# Patient Record
Sex: Male | Born: 1995 | Race: Black or African American | Hispanic: No | Marital: Single | Smoking: Never smoker
Health system: Southern US, Community
[De-identification: ages and names within clinical notes are randomized; demographics above are authoritative.]

## PROBLEM LIST (undated history)

## (undated) DIAGNOSIS — C8595 Non-Hodgkin lymphoma, unspecified, lymph nodes of inguinal region and lower limb: Secondary | ICD-10-CM

## (undated) DIAGNOSIS — C8590 Non-Hodgkin lymphoma, unspecified, unspecified site: Secondary | ICD-10-CM

## (undated) HISTORY — PX: ANKLE SURGERY: SHX546

## (undated) HISTORY — PX: ORIF FINGER / THUMB FRACTURE: SUR932

## (undated) HISTORY — PX: FRACTURE SURGERY: SHX138

---

## 2014-10-31 DIAGNOSIS — S52202A Unspecified fracture of shaft of left ulna, initial encounter for closed fracture: Secondary | ICD-10-CM | POA: Insufficient documentation

## 2014-10-31 DIAGNOSIS — S63045A Dislocation of carpometacarpal joint of left thumb, initial encounter: Secondary | ICD-10-CM | POA: Insufficient documentation

## 2014-10-31 DIAGNOSIS — S52302A Unspecified fracture of shaft of left radius, initial encounter for closed fracture: Secondary | ICD-10-CM

## 2014-11-16 DIAGNOSIS — M25572 Pain in left ankle and joints of left foot: Secondary | ICD-10-CM | POA: Insufficient documentation

## 2014-12-30 DIAGNOSIS — S63642A Sprain of metacarpophalangeal joint of left thumb, initial encounter: Secondary | ICD-10-CM | POA: Insufficient documentation

## 2014-12-30 DIAGNOSIS — S5322XA Traumatic rupture of left radial collateral ligament, initial encounter: Secondary | ICD-10-CM | POA: Insufficient documentation

## 2018-05-07 ENCOUNTER — Other Ambulatory Visit: Payer: Self-pay

## 2018-05-07 ENCOUNTER — Inpatient Hospital Stay (HOSPITAL_COMMUNITY)
Admission: EM | Admit: 2018-05-07 | Discharge: 2018-05-09 | DRG: 824 | Disposition: A | Discharge status: Home / Self Care | Payer: BLUE CROSS/BLUE SHIELD | Attending: Internal Medicine | Admitting: Internal Medicine

## 2018-05-07 ENCOUNTER — Emergency Department (HOSPITAL_COMMUNITY): Payer: BLUE CROSS/BLUE SHIELD

## 2018-05-07 ENCOUNTER — Encounter (HOSPITAL_COMMUNITY): Payer: Self-pay | Admitting: Emergency Medicine

## 2018-05-07 DIAGNOSIS — J9859 Other diseases of mediastinum, not elsewhere classified: Secondary | ICD-10-CM | POA: Diagnosis not present

## 2018-05-07 DIAGNOSIS — C8112 Nodular sclerosis classical Hodgkin lymphoma, intrathoracic lymph nodes: Secondary | ICD-10-CM | POA: Diagnosis not present

## 2018-05-07 DIAGNOSIS — I3139 Other pericardial effusion (noninflammatory): Secondary | ICD-10-CM

## 2018-05-07 DIAGNOSIS — R079 Chest pain, unspecified: Secondary | ICD-10-CM | POA: Diagnosis not present

## 2018-05-07 DIAGNOSIS — R222 Localized swelling, mass and lump, trunk: Secondary | ICD-10-CM

## 2018-05-07 DIAGNOSIS — R000 Tachycardia, unspecified: Secondary | ICD-10-CM | POA: Diagnosis present

## 2018-05-07 DIAGNOSIS — I313 Pericardial effusion (noninflammatory): Secondary | ICD-10-CM | POA: Diagnosis present

## 2018-05-07 LAB — CBC
HCT: 42.3 % (ref 39.0–52.0)
HEMOGLOBIN: 14.2 g/dL (ref 13.0–17.0)
MCH: 26.9 pg (ref 26.0–34.0)
MCHC: 33.6 g/dL (ref 30.0–36.0)
MCV: 80.1 fL (ref 80.0–100.0)
PLATELETS: 366 10*3/uL (ref 150–400)
RBC: 5.28 MIL/uL (ref 4.22–5.81)
RDW: 15 % (ref 11.5–15.5)
WBC: 15.5 10*3/uL — ABNORMAL HIGH (ref 4.0–10.5)
nRBC: 0 % (ref 0.0–0.2)

## 2018-05-07 LAB — D-DIMER, QUANTITATIVE: D-Dimer, Quant: 0.35 ug/mL-FEU (ref 0.00–0.50)

## 2018-05-07 LAB — BASIC METABOLIC PANEL
Anion gap: 9 (ref 5–15)
BUN: 11 mg/dL (ref 6–20)
CALCIUM: 9.5 mg/dL (ref 8.9–10.3)
CO2: 25 mmol/L (ref 22–32)
Chloride: 103 mmol/L (ref 98–111)
Creatinine, Ser: 1.07 mg/dL (ref 0.61–1.24)
GFR calc Af Amer: >60 mL/min (ref >60)
GLUCOSE: 82 mg/dL (ref 70–99)
POTASSIUM: 3.9 mmol/L (ref 3.5–5.1)
Sodium: 137 mmol/L (ref 135–145)

## 2018-05-07 LAB — I-STAT TROPONIN, ED: TROPONIN I, POC: 0 ng/mL (ref 0.00–0.08)

## 2018-05-07 LAB — BRAIN NATRIURETIC PEPTIDE: B Natriuretic Peptide: 14.2 pg/mL (ref 0.0–100.0)

## 2018-05-07 LAB — SAVE SMEAR (SSMR)

## 2018-05-07 MED ORDER — ACETAMINOPHEN 325 MG PO TABS
650.0000 mg | ORAL_TABLET | Freq: Four times a day (QID) | ORAL | Status: DC | PRN
  Administered 2018-05-08: 650 mg via ORAL
  Filled 2018-05-07: qty 2

## 2018-05-07 MED ORDER — SODIUM CHLORIDE 0.9 % IV BOLUS
1000.0000 mL | Freq: Once | INTRAVENOUS | Status: AC
  Administered 2018-05-07: 1000 mL via INTRAVENOUS

## 2018-05-07 MED ORDER — ONDANSETRON HCL 4 MG/2ML IJ SOLN: 4.0000 mg | Freq: Four times a day (QID) | INTRAMUSCULAR | Status: DC | PRN

## 2018-05-07 MED ORDER — SODIUM CHLORIDE 0.9 % IV SOLN
INTRAVENOUS | Status: AC
  Administered 2018-05-08 (×2): via INTRAVENOUS

## 2018-05-07 MED ORDER — ONDANSETRON HCL 4 MG PO TABS: 4.0000 mg | ORAL_TABLET | Freq: Four times a day (QID) | ORAL | Status: DC | PRN

## 2018-05-07 MED ORDER — IOPAMIDOL (ISOVUE-370) INJECTION 76%
INTRAVENOUS | Status: AC
  Filled 2018-05-07: qty 100

## 2018-05-07 MED ORDER — IOPAMIDOL (ISOVUE-370) INJECTION 76%
100.0000 mL | Freq: Once | INTRAVENOUS | Status: AC | PRN
  Administered 2018-05-07: 100 mL via INTRAVENOUS

## 2018-05-07 MED ORDER — ACETAMINOPHEN 650 MG RE SUPP: 650.0000 mg | Freq: Four times a day (QID) | RECTAL | Status: DC | PRN

## 2018-05-07 NOTE — ED Triage Notes (Addendum)
Pt c/o mid chest pain x's 2 days.  St's he went to a urgent care and was sent here for further evaluation .  Painful to take a deep breath.  Pt st's he went to a Urgent Care on [address]

## 2018-05-07 NOTE — ED Provider Notes (Signed)
Patient placed in Quick Look pathway, seen and evaluated   Chief Complaint: chest pain  HPI:   Pt states he woke up yesterday with chest pain in center of the chest. Tried ibuprofen, which improved his pain from 8/10 to 3/10. States today went to [facility] and had xray done and was told he had enlarged heart and sent here. Denies SOB. Denies recent travel or surgeries.  Denies any swelling in extremities.  Patient states pain is worse with movement and taking deep breaths.  He denies any shortness of breath.  He denies any injuries. ROS: Positive for chest pain.  Physical Exam:   Gen: No distress  Neuro: Awake and Alert  Skin: Warm    Focused Exam: Tenderness to palpation over the sternum.  Lungs are clear, regular heart rate and rhythm.  Vitals:   05/07/18 1707 05/07/18 1710  BP:  124/82  Pulse:  (!) 110  Resp:  18  Temp:  98.3 F (36.8 C)  TempSrc:  Oral  SpO2:  100%  Weight: 82.1 kg   Height: 5' 8.5" (1.74 m)    Patient in emergency department with centralized chest pain, that started 2 days ago.  Pain did improve with ibuprofen but got worse again once and wore out.  Went to urgent care, [facility], and was told that his heart might be enlarged.  He was sent here from there for further evaluation.  Patient is tachycardic, has pleuritic chest pain, concerning for possible PE.  I will get a d-dimer, he is relatively low risk.  We will repeat a chest x-ray here and basic labs.  EKG ordered as well.     Initiation of care has begun. The patient has been counseled on the process, plan, and necessity for staying for the completion/evaluation, and the remainder of the medical screening examination    Jeannett Senior, PA-C 05/07/18 1723    Little, Wenda Overland, MD 05/08/18 1426

## 2018-05-07 NOTE — H&P (Signed)
History and Physical    David Lam DTO:[id number] DOB: April 04, 1996 DOA: 05/07/2018  PCP: System, Pcp Not In  Patient coming from: Home.  Chief Complaint: Chest pain.  HPI: David Lam is a 22 y.o. male with no significant past medical history presents to the ER because of 48 hours of chest pain.  Chest pain is mostly pleuritic in nature and positional.  Denies any exertional symptoms denies any fever chills shortness of breath productive cough or any hemoptysis.  Denies any diaphoresis or night sweats.  Has been having some weight loss which patient states has been delivered.  Denies any recent travel or sick contact.  ED Course: In the ER patient is found to be in sinus tachycardia.  Labs show leukocytosis.  Chest x-ray was showing mediastinal mass for which CT angiogram of the chest was done which shows superior mediastinal mass.  On-call oncologist Dr. Alvy Bimler was consulted by the ER physician who recommended admission and further work-up including biopsy possibly by a cardiothoracic surgeon in the morning.  On my exam patient is mildly tachycardic otherwise chest pain-free.  Troponins were negative.  Review of Systems: As per HPI, rest all negative.   History reviewed. No pertinent past medical history.  Past Surgical History:  Procedure Laterality Date  . FRACTURE SURGERY       reports that he has never smoked. He has never used smokeless tobacco. He reports that he does not drink alcohol or use drugs.  No Known Allergies  History reviewed. No pertinent family history.  Prior to Admission medications   Medication Sig Start Date End Date Taking? Authorizing Provider  diphenhydrAMINE (BENADRYL) 25 mg capsule Take 25 mg by mouth every 6 (six) hours as needed for allergies.   Yes [provider]    Physical Exam: Vitals:   05/07/18 2030 05/07/18 2045 05/07/18 2145 05/07/18 2200  BP:   112/74 114/64  Pulse: (!) 111 (!) 103 (!) 107 100  Resp: 15 (!) 27 20 (!) 22   Temp:      TempSrc:      SpO2: 100% 100% 99% 100%  Weight:      Height:          Constitutional: Moderately built and nourished. Vitals:   05/07/18 2030 05/07/18 2045 05/07/18 2145 05/07/18 2200  BP:   112/74 114/64  Pulse: (!) 111 (!) 103 (!) 107 100  Resp: 15 (!) 27 20 (!) 22  Temp:      TempSrc:      SpO2: 100% 100% 99% 100%  Weight:      Height:       Eyes: Anicteric no pallor. ENMT: No discharge from the ears eyes nose or mouth. Neck: No mass felt.  No neck rigidity but no JVD appreciated. Respiratory: No rhonchi or crepitations. Cardiovascular: S1-S2 heard tachycardic. Abdomen: Soft nontender bowel sounds present. Musculoskeletal: No edema.  No joint effusion. Skin: No rash. Neurologic: Alert awake oriented to time place and person.  Moves all extremities. Psychiatric: Appears normal per normal affect.   Labs on Admission: I have personally reviewed following labs and imaging studies  CBC: Recent Labs  Lab 05/07/18 1723  WBC 15.5*  HGB 14.2  HCT 42.3  MCV 80.1  PLT 983   Basic Metabolic Panel: Recent Labs  Lab 05/07/18 1723  NA 137  K 3.9  CL 103  CO2 25  GLUCOSE 82  BUN 11  CREATININE 1.07  CALCIUM 9.5   GFR: Estimated Creatinine Clearance: 105.7 mL/min (  by C-G formula based on SCr of 1.07 mg/dL). Liver Function Tests: No results for input(s): AST, ALT, ALKPHOS, BILITOT, PROT, ALBUMIN in the last 168 hours. No results for input(s): LIPASE, AMYLASE in the last 168 hours. No results for input(s): AMMONIA in the last 168 hours. Coagulation Profile: No results for input(s): INR, PROTIME in the last 168 hours. Cardiac Enzymes: No results for input(s): CKTOTAL, CKMB, CKMBINDEX, TROPONINI in the last 168 hours. BNP (last 3 results) No results for input(s): PROBNP in the last 8760 hours. HbA1C: No results for input(s): HGBA1C in the last 72 hours. CBG: No results for input(s): GLUCAP in the last 168 hours. Lipid Profile: No results for  input(s): CHOL, HDL, LDLCALC, TRIG, CHOLHDL, LDLDIRECT in the last 72 hours. Thyroid Function Tests: No results for input(s): TSH, T4TOTAL, FREET4, T3FREE, THYROIDAB in the last 72 hours. Anemia Panel: No results for input(s): VITAMINB12, FOLATE, FERRITIN, TIBC, IRON, RETICCTPCT in the last 72 hours. Urine analysis: No results found for: COLORURINE, APPEARANCEUR, LABSPEC, PHURINE, GLUCOSEU, HGBUR, BILIRUBINUR, KETONESUR, PROTEINUR, UROBILINOGEN, NITRITE, LEUKOCYTESUR Sepsis Labs: @LABRCNTIP (procalcitonin:4,lacticidven:4) )No results found for this or any previous visit (from the past 240 hour(s)).   Radiological Exams on Admission: Dg Chest 2 View  Result Date: 05/07/2018 CLINICAL DATA:  Mid to LEFT side chest pain for 2 days, painful to take a deep breath EXAM: CHEST - 2 VIEW COMPARISON:  None FINDINGS: Normal heart size. Pulmonary vascularity normal. Abnormal soft tissue density at the superior mediastinum, AP window, RIGHT paratracheal, and RIGHT hilum highly suggestive at adenopathy. Lungs clear. No infiltrate, pleural effusion or pneumothorax. Spina bifida occulta of C7. IMPRESSION: Suspected mediastinal and RIGHT hilar adenopathy; this can be seen with multiple etiologies including infection, other inflammatory processes, lymphoproliferative disorders, and malignancy. CT chest with contrast recommended for further evaluation. Electronically Signed   By: Lavonia Dana M.D.   On: 05/07/2018 18:00   Ct Angio Chest Pe W/cm &/or Wo Cm  Result Date: 05/07/2018 CLINICAL DATA:  Abnormal chest radiograph showing an enlarged mediastinum. Chest pain for 2 days. Afebrile. EXAM: CT ANGIOGRAPHY CHEST WITH CONTRAST TECHNIQUE: Multidetector CT imaging of the chest was performed using the standard protocol during bolus administration of intravenous contrast. Multiplanar CT image reconstructions and MIPs were obtained to evaluate the vascular anatomy. CONTRAST:  171mL ISOVUE-370 IOPAMIDOL (ISOVUE-370)  INJECTION 76% COMPARISON:  Chest radiograph from same day. FINDINGS: Cardiovascular: Included heart size is top normal with small pericardial effusion measuring up 1 cm in thickness. No thoracic aortic aneurysm or dissection. No acute pulmonary embolus. Patent great vessels with conventional branch pattern. Mediastinum/Nodes: Confluent appearing noncalcified soft tissue masses are noted within the superior mediastinum bilaterally. On the right, the largest conglomeration measures approximately a 9.9 x 7.1 x 5.6 cm and on the left, 5.9 x 4.1 x 4 cm. Smaller paratracheal soft tissue masses are noted. Nonpathologic size hilar lymph nodes are identified the largest on the right approximately 1.1 cm short axis. Lungs/Pleura: Scattered ground-glass opacities likely representing areas of alveolitis/pneumonitis or hypoventilatory change are identified. No dominant mass effusion or pneumothorax. Trace pleural effusions bilaterally. Upper Abdomen: No splenomegaly. The unenhanced liver, included adrenal glands and kidneys are unremarkable. Musculoskeletal: No acute nor aggressive osseous abnormality. No axillary lymphadenopathy. Review of the MIP images confirms the above findings. IMPRESSION: Bulky, bilateral anterior superior mediastinal confluent soft tissue masses which are not calcified in appearance and which do not appear to cause occlusion or significant luminal narrowing of traversing vasculature. Leading consideration is lymphoma, possibly Hodgkin's. Given  lack of differentiating soft tissue densities, a teratoma is believed less likely. Nonseminomatous germ cell tumor given presence of small effusions might also be within differential though no pulmonary lesions are visualized. Metastatic disease, infection or inflammatory process are believed less likely. Electronically Signed   By: Ashley Royalty M.D.   On: 05/07/2018 21:45    EKG: Independently reviewed.  Sinus tachycardia with nonspecific ST-T  changes.  Assessment/Plan Active Problems:   Chest pain   Mediastinal mass    1. 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  On-call oncologist Dr. Rozetta Nunnery advised to have biopsy probably by cardiothoracic surgeon in the morning.  Will check LDH sed rate HIV status. 2. Chest pain with pericardial effusion appears to be pleuritic.  Check sed rate and 2D echo.  Recheck cardiac markers. 3. Leukocytosis -patient is afebrile. 4. Sinus tachycardia -check TSH and follow 2D echo.   DVT prophylaxis: SCDs in anticipation of procedure. Code Status: Full code. Family Communication: Discussed with patient. Disposition Plan: Home. Consults called: ER physician discussed with on-call oncologist. Admission status: Observation.   Rise Patience MD [facility] Pager [phone].  If 7PM-7AM, please contact night-coverage [email or web address] Password [email or web address]  05/07/2018, 10:50 PM

## 2018-05-07 NOTE — ED Provider Notes (Signed)
[place] EMERGENCY DEPARTMENT Provider Note   CSN: [id number] Arrival date & time: 05/07/18  1658     History   Chief Complaint Chief Complaint  Patient presents with  . Chest Pain    HPI David Lam is a 22 y.o. male with no significant past medical history who presents today for evaluation of an abnormal chest x-ray from urgent care.  He reports that Sunday night when he went to bed he had pain in the center of his chest.  Yesterday he tried some ibuprofen which took his pain from a 8 out of 10 down to a 3 out of 10.  He went to urgent care and was sent here.  He reports his pain is worse with movement and with taking deep breaths.  He denies any recent trauma, no shortness of breath.  He reports no unintentional weight loss, fevers, night sweats or chills.  He recently got his flu shot.  He denies significant coughing.  [place] exposure [place].  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    He states that he has not seen a doctor in many years.  He eats healthy  HPI  History reviewed. No pertinent past medical history.  Patient Active Problem List   Diagnosis Date Noted  . Chest pain 05/07/2018    Past Surgical History:  Procedure Laterality Date  . FRACTURE SURGERY          Home Medications    Prior to Admission medications   Medication Sig Start Date End Date Taking? Authorizing Provider  diphenhydrAMINE (BENADRYL) 25 mg capsule Take 25 mg by mouth every 6 (six) hours as needed for allergies.   Yes [provider]    Family History No family history on file.  Social History Social History   Tobacco Use  . Smoking status: Never Smoker  . Smokeless tobacco: Never Used  Substance Use Topics  . Alcohol use: Never    Frequency: Never  . Drug use: Never     Allergies   Patient has no known allergies.   Review of Systems Review of Systems    Constitutional: Negative for chills, fatigue, fever and unexpected weight change.  HENT: Negative for congestion and facial swelling.   Respiratory: Negative for chest tightness and shortness of breath.   Cardiovascular: Positive for chest pain. Negative for palpitations and leg swelling.  Gastrointestinal: Negative for abdominal pain, nausea and vomiting.  Musculoskeletal: Negative for back pain and neck pain.  Skin: Negative for rash.  Neurological: Negative for weakness and headaches.  All other systems reviewed and are negative.    Physical Exam Updated Vital Signs BP 114/64   Pulse 100   Temp 98.3 F (36.8 C) (Oral)   Resp (!) 22   Ht 5' 8.5" (1.74 m)   Wt 82.1 kg   SpO2 100%   BMI 27.12 kg/m   Physical Exam  Constitutional: He appears well-developed and well-nourished.  Non-toxic appearance. No distress.  HENT:  Head: Normocephalic and atraumatic.  Eyes: Conjunctivae are normal.  Neck: Normal range of motion. Neck supple.  Cardiovascular: Normal rate, regular rhythm, intact distal pulses and normal pulses.  No murmur heard. Pulses:      Radial pulses are 2+ on the right side, and 2+ on the left side.  Pulmonary/Chest: Effort normal and breath sounds normal. No respiratory distress. He has no decreased breath sounds. He  has no wheezes. He has no rhonchi. He has no rales.  Abdominal: Soft. There is no tenderness.  Musculoskeletal: He exhibits no edema.       Right lower leg: Normal. He exhibits no tenderness and no edema.       Left lower leg: Normal. He exhibits no tenderness and no edema.  Neurological: He is alert.  Skin: Skin is warm and dry.  Psychiatric: He has a normal mood and affect. His behavior is normal.  Nursing note and vitals reviewed.    ED Treatments / Results  Labs (all labs ordered are listed, but only abnormal results are displayed) Labs Reviewed  CBC - Abnormal; Notable for the following components:      Result Value   WBC 15.5 (*)     All other components within normal limits  BASIC METABOLIC PANEL  D-DIMER, QUANTITATIVE (NOT AT [facility])  BRAIN NATRIURETIC PEPTIDE  DIFFERENTIAL  SAVE SMEAR (SSMR)  I-STAT TROPONIN, ED    EKG None  Radiology Dg Chest 2 View  Result Date: 05/07/2018 CLINICAL DATA:  Mid to LEFT side chest pain for 2 days, painful to take a deep breath EXAM: CHEST - 2 VIEW COMPARISON:  None FINDINGS: Normal heart size. Pulmonary vascularity normal. Abnormal soft tissue density at the superior mediastinum, AP window, RIGHT paratracheal, and RIGHT hilum highly suggestive at adenopathy. Lungs clear. No infiltrate, pleural effusion or pneumothorax. Spina bifida occulta of C7. IMPRESSION: Suspected mediastinal and RIGHT hilar adenopathy; this can be seen with multiple etiologies including infection, other inflammatory processes, lymphoproliferative disorders, and malignancy. CT chest with contrast recommended for further evaluation. Electronically Signed   By: Lavonia Dana M.D.   On: 05/07/2018 18:00   Ct Angio Chest Pe W/cm &/or Wo Cm  Result Date: 05/07/2018 CLINICAL DATA:  Abnormal chest radiograph showing an enlarged mediastinum. Chest pain for 2 days. Afebrile. EXAM: CT ANGIOGRAPHY CHEST WITH CONTRAST TECHNIQUE: Multidetector CT imaging of the chest was performed using the standard protocol during bolus administration of intravenous contrast. Multiplanar CT image reconstructions and MIPs were obtained to evaluate the vascular anatomy. CONTRAST:  155mL ISOVUE-370 IOPAMIDOL (ISOVUE-370) INJECTION 76% COMPARISON:  Chest radiograph from same day. FINDINGS: Cardiovascular: Included heart size is top normal with small pericardial effusion measuring up 1 cm in thickness. No thoracic aortic aneurysm or dissection. No acute pulmonary embolus. Patent great vessels with conventional branch pattern. Mediastinum/Nodes: Confluent appearing noncalcified soft tissue masses are noted within the superior mediastinum bilaterally. On the  right, the largest conglomeration measures approximately a 9.9 x 7.1 x 5.6 cm and on the left, 5.9 x 4.1 x 4 cm. Smaller paratracheal soft tissue masses are noted. Nonpathologic size hilar lymph nodes are identified the largest on the right approximately 1.1 cm short axis. Lungs/Pleura: Scattered ground-glass opacities likely representing areas of alveolitis/pneumonitis or hypoventilatory change are identified. No dominant mass effusion or pneumothorax. Trace pleural effusions bilaterally. Upper Abdomen: No splenomegaly. The unenhanced liver, included adrenal glands and kidneys are unremarkable. Musculoskeletal: No acute nor aggressive osseous abnormality. No axillary lymphadenopathy. Review of the MIP images confirms the above findings. IMPRESSION: Bulky, bilateral anterior superior mediastinal confluent soft tissue masses which are not calcified in appearance and which do not appear to cause occlusion or significant luminal narrowing of traversing vasculature. Leading consideration is lymphoma, possibly Hodgkin's. Given lack of differentiating soft tissue densities, a teratoma is believed less likely. Nonseminomatous germ cell tumor given presence of small effusions might also be within differential though no pulmonary lesions are visualized.  Metastatic disease, infection or inflammatory process are believed less likely. Electronically Signed   By: Ashley Royalty M.D.   On: 05/07/2018 21:45    Procedures Procedures (including critical care time)  Medications Ordered in ED Medications  iopamidol (ISOVUE-370) 76 % injection 100 mL (100 mLs Intravenous Contrast Given 05/07/18 2110)  sodium chloride 0.9 % bolus 1,000 mL (1,000 mLs Intravenous New Bag/Given 05/07/18 2137)     Initial Impression / Assessment and Plan / ED Course  I have reviewed the triage vital signs and the nursing notes.  Pertinent labs & imaging results that were available during my care of the patient were reviewed by me and considered  in my medical decision making (see chart for details).  Clinical Course as of May 07 2244  Tue May 07, 2018  2214 Spoke with Dr. Alvy Bimler from oncology, recommended medical admission with CT surgery biopsy consult.    [EH]  2227 Spoke with patient, and then at his request his sister by phone who lives in [place].  Explained the CT scan results to the patient, with concern for cancer.  Explained that it is not definite until he gets biopsy results.  He is agreeable to be admitted.    [EH]    Clinical Course User Index [EH] Lorin Glass, PA-C   David Lam presents today for duration of 2 days of chest pain.  He initially went to urgent care and was referred here for an abnormal x-ray.  Labs were obtained and reviewed, white count is mildly elevated at 15.5, however otherwise unremarkable.  He is tachycardic here.  His d-dimer is not elevated at 0.35.  Troponin is not elevated.  BNP 14.2.  Chest x-ray was obtained with recommendation of CT.  CT angios PE study was performed showing multi bilateral anterior superior mediastinal confluent soft tissue masses which are noncalcified, concerning for lymphoma or Hodgkin's lymphoma.  CT scan also showed a small pericardial effusion.  Given concern for chest mass with possible new cancer I spoke with oncology Dr. Alvy Bimler who requested the patient be admitted by medicine at [facility], for access to CT surgery for biopsy.  These results were discussed with patient, and at his request I spoke with his sister over the phone.    Hospitalist was consulted and agreed to admit patient.  Final Clinical Impressions(s) / ED Diagnoses   Final diagnoses:  Chest mass  Pericardial effusion  Chest pain, unspecified type    ED Discharge Orders    None       Lorin Glass, PA-C 05/07/18 2249    Lennice Sites, DO 05/08/18 1623

## 2018-05-08 ENCOUNTER — Other Ambulatory Visit: Payer: Self-pay

## 2018-05-08 ENCOUNTER — Inpatient Hospital Stay (HOSPITAL_COMMUNITY): Payer: BLUE CROSS/BLUE SHIELD

## 2018-05-08 ENCOUNTER — Observation Stay (HOSPITAL_COMMUNITY): Payer: BLUE CROSS/BLUE SHIELD

## 2018-05-08 DIAGNOSIS — R590 Localized enlarged lymph nodes: Secondary | ICD-10-CM

## 2018-05-08 DIAGNOSIS — J9859 Other diseases of mediastinum, not elsewhere classified: Secondary | ICD-10-CM

## 2018-05-08 DIAGNOSIS — I313 Pericardial effusion (noninflammatory): Secondary | ICD-10-CM | POA: Diagnosis present

## 2018-05-08 DIAGNOSIS — R079 Chest pain, unspecified: Secondary | ICD-10-CM | POA: Diagnosis not present

## 2018-05-08 DIAGNOSIS — R0789 Other chest pain: Secondary | ICD-10-CM

## 2018-05-08 DIAGNOSIS — C8112 Nodular sclerosis classical Hodgkin lymphoma, intrathoracic lymph nodes: Secondary | ICD-10-CM | POA: Diagnosis present

## 2018-05-08 DIAGNOSIS — R000 Tachycardia, unspecified: Secondary | ICD-10-CM | POA: Diagnosis present

## 2018-05-08 LAB — HEPATIC FUNCTION PANEL
ALBUMIN: 3.5 g/dL (ref 3.5–5.0)
ALT: 13 U/L (ref 0–44)
AST: 14 U/L — ABNORMAL LOW (ref 15–41)
Alkaline Phosphatase: 76 U/L (ref 38–126)
BILIRUBIN INDIRECT: 0.8 mg/dL (ref 0.3–0.9)
BILIRUBIN TOTAL: 1 mg/dL (ref 0.3–1.2)
Bilirubin, Direct: 0.2 mg/dL (ref 0.0–0.2)
Total Protein: 7.7 g/dL (ref 6.5–8.1)

## 2018-05-08 LAB — BASIC METABOLIC PANEL
Anion gap: 6 (ref 5–15)
BUN: 11 mg/dL (ref 6–20)
CALCIUM: 9.2 mg/dL (ref 8.9–10.3)
CO2: 26 mmol/L (ref 22–32)
CREATININE: 1.08 mg/dL (ref 0.61–1.24)
Chloride: 105 mmol/L (ref 98–111)
GFR calc non Af Amer: >60 mL/min (ref >60)
Glucose, Bld: 145 mg/dL — ABNORMAL HIGH (ref 70–99)
Potassium: 3.4 mmol/L — ABNORMAL LOW (ref 3.5–5.1)
SODIUM: 137 mmol/L (ref 135–145)

## 2018-05-08 LAB — DIFFERENTIAL
BLASTS: 0 %
Band Neutrophils: 0 %
Basophils Absolute: 0 10*3/uL (ref 0.0–0.1)
Basophils Relative: 1 %
EOS PCT: 3 %
Eosinophils Absolute: 0.2 10*3/uL (ref 0.0–0.5)
LYMPHS PCT: 19 %
Lymphs Abs: 1 10*3/uL (ref 0.7–4.0)
METAMYELOCYTES PCT: 0 %
MONO ABS: 1 10*3/uL (ref 0.1–1.0)
MYELOCYTES: 0 %
Monocytes Relative: 8 %
NEUTROS PCT: 69 %
NRBC: 0 /100{WBCs}
Neutro Abs: 8.2 10*3/uL — ABNORMAL HIGH (ref 1.7–7.7)
Other: 0 %
Promyelocytes Relative: 0 %

## 2018-05-08 LAB — CBC WITH DIFFERENTIAL/PLATELET
Abs Immature Granulocytes: 0.02 10*3/uL (ref 0.00–0.07)
BASOS ABS: 0 10*3/uL (ref 0.0–0.1)
BASOS PCT: 0 %
EOS ABS: 0.2 10*3/uL (ref 0.0–0.5)
EOS PCT: 2 %
HCT: 37.6 % — ABNORMAL LOW (ref 39.0–52.0)
HEMOGLOBIN: 12.6 g/dL — AB (ref 13.0–17.0)
Immature Granulocytes: 0 %
LYMPHS PCT: 9 %
Lymphs Abs: 1.1 10*3/uL (ref 0.7–4.0)
MCH: 26.4 pg (ref 26.0–34.0)
MCHC: 33.5 g/dL (ref 30.0–36.0)
MCV: 78.7 fL — ABNORMAL LOW (ref 80.0–100.0)
Monocytes Absolute: 0.9 10*3/uL (ref 0.1–1.0)
Monocytes Relative: 8 %
NRBC: 0 % (ref 0.0–0.2)
Neutro Abs: 9.7 10*3/uL — ABNORMAL HIGH (ref 1.7–7.7)
Neutrophils Relative %: 81 %
PLATELETS: 322 10*3/uL (ref 150–400)
RBC: 4.78 MIL/uL (ref 4.22–5.81)
RDW: 14.6 % (ref 11.5–15.5)
WBC: 11.9 10*3/uL — AB (ref 4.0–10.5)

## 2018-05-08 LAB — LACTATE DEHYDROGENASE: LDH: 105 U/L (ref 98–192)

## 2018-05-08 LAB — HIV ANTIBODY (ROUTINE TESTING W REFLEX): HIV Screen 4th Generation wRfx: NONREACTIVE

## 2018-05-08 LAB — ECHOCARDIOGRAM COMPLETE
Height: 68 in
WEIGHTICAEL: 2811.31 [oz_av]

## 2018-05-08 LAB — TSH: TSH: 3.442 u[IU]/mL (ref 0.350–4.500)

## 2018-05-08 LAB — TROPONIN I

## 2018-05-08 LAB — SEDIMENTATION RATE: SED RATE: 19 mm/h — AB (ref 0–16)

## 2018-05-08 LAB — CK: CK TOTAL: 129 U/L (ref 49–397)

## 2018-05-08 MED ORDER — IOHEXOL 300 MG/ML  SOLN
100.0000 mL | Freq: Once | INTRAMUSCULAR | Status: AC | PRN
  Administered 2018-05-08: 100 mL via INTRAVENOUS

## 2018-05-08 NOTE — Progress Notes (Signed)
Adm from ED via stretcher by Tech. Pt is A&OX3.  CP 3/10 and didn't need med. Pt breathing w/o difficulty. Pt has stage II (pink good healing) wound on top of left foot. MAE

## 2018-05-08 NOTE — Consult Note (Signed)
[address]       [place] [address]             [phone]                    Malachi Chizmar [place] Medical Record #[id number] Date of Birth: [birth year]  Referring: No ref. provider found Primary Care: System, Pcp Not In Primary Cardiologist: No primary care provider on file.  Chief Complaint:    Chief Complaint  Patient presents with  . Chest Pain    History of Present Illness:    David Lam 22 y.o. male is seen because of abnormal CT scan of the chest.  The patient gives a history of mild chest discomfort of several days duration which led him to go to urgent care.  Chest x-ray was done, because of mediastinal mass the patient was sent to the emergency room for admission.  Patient was admitted last night CT of the chest was done last night CT of the abdomen and pelvis done this morning.  Patient denies fever chills night sweats or weight loss.    Current Activity/ Functional Status:  Patient is independent with mobility/ambulation, transfers, ADL's, IADL's.   Zubrod Score: At the time of surgery this patient's most appropriate activity status/level should be described as: [x]     0    Normal activity, no symptoms []     1    Restricted in physical strenuous activity but ambulatory, able to do out light work []     2    Ambulatory and capable of self care, unable to do work activities, up and about               >50 % of waking hours                              []     3    Only limited self care, in bed greater than 50% of waking hours []     4    Completely disabled, no self care, confined to bed or chair []     5    Moribund   History reviewed. No pertinent past medical history.  Past Surgical History:  Procedure Laterality Date  . FRACTURE SURGERY      History reviewed. No pertinent family history.  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 Frequency: Never     No Known Allergies  Current Facility-Administered Medications  Medication Dose Route Frequency Provider Last Rate Last Dose  . 0.9 %  sodium chloride infusion   Intravenous Continuous Rise Patience, MD 75 mL/hr at 05/08/18 0017    . acetaminophen (TYLENOL) tablet 650 mg  650 mg Oral Q6H PRN Rise Patience, MD       Or  . acetaminophen (TYLENOL) suppository 650 mg  650 mg Rectal Q6H PRN Rise Patience, MD      . ondansetron [facility]) tablet 4 mg  4 mg Oral Q6H PRN Rise Patience, MD       Or  . ondansetron (  ZOFRAN) injection 4 mg  4 mg Intravenous Q6H PRN Rise Patience, MD          Review of Systems:     Cardiac Review of Systems: [Y] = yes  or   [ N ] = no   Chest Pain [  y  ]  Resting SOB [n   ] Exertional SOB  [n  ]  Orthopnea [ n ]   Pedal Edema [n   ]    Palpitations [  n] Syncope  Florencio.Farrier  ]   Presyncope [  n ]   General Review of Systems: [Y] = yes [  ]=no Constitional: recent weight change [ n ];  Wt loss over the last 3 months [   ] anorexia [  ]; fatigue [  ]; nausea [  ]; night sweats [  ]; fever [  ]; or chills [  ];           Eye : blurred vision [  ]; diplopia [   ]; vision changes [  ];  Amaurosis fugax[  ]; Resp: cough [  ];  wheezing[  ];  hemoptysis[  ]; shortness of breath[  ]; paroxysmal nocturnal dyspnea[  ]; dyspnea on exertion[  ]; or orthopnea[  ];  GI:  gallstones[  ], vomiting[  ];  dysphagia[  ]; melena[  ];  hematochezia [  ]; heartburn[  ];   Hx of  Colonoscopy[  ]; GU: kidney stones [  ]; hematuria[  ];   dysuria [  ];  nocturia[  ];  history of     obstruction [  ]; urinary frequency [  ]             Skin: rash, swelling[  ];, hair loss[  ];  peripheral edema[  ];  or  itching[  ]; Musculosketetal: myalgias[  ];  joint swelling[  ];  joint erythema[  ];  joint pain[  ];  back pain[  ];  Heme/Lymph: bruising[  ];  bleeding[  ];  anemia[  ];  Neuro: TIA[  ];  headaches[  ];  stroke[  ];  vertigo[  ];  seizures[  ];   paresthesias[  ];  difficulty walking[  ];  Psych:depression[  ]; anxiety[  ];  Endocrine: diabetes[  ];  thyroid dysfunction[  ];  Immunizations: Flu up to date [  ]; Pneumococcal up to date [  ];  Other: No fever chills night sweats no weight loss     PHYSICAL EXAMINATION: BP 121/71 (BP Location: Right Arm)   Pulse 91   Temp 98.2 F (36.8 C) (Oral)   Resp 18   Ht 5\' 8"  (1.727 m)   Wt 79.7 kg   SpO2 100%   BMI 26.72 kg/m  General appearance: alert, cooperative, appears stated age and no distress Head: Normocephalic, without obvious abnormality, atraumatic Neck: no adenopathy, no carotid bruit, no JVD, supple, symmetrical, trachea midline and thyroid not enlarged, symmetric, no tenderness/mass/nodules Lymph nodes: Cervical, supraclavicular, and axillary nodes normal. Resp: clear to auscultation bilaterally Back: symmetric, no curvature. ROM normal. No CVA tenderness. Cardio: regular rate and rhythm, S1, S2 normal, no murmur, click, rub or gallop GI: soft, non-tender; bowel sounds normal; no masses,  no organomegaly Extremities: extremities normal, atraumatic, no cyanosis or edema Neurologic: Grossly normal  Diagnostic Studies & Laboratory data:     Recent Radiology Findings:   Dg Chest 2 View  Result Date: 05/07/2018 CLINICAL DATA:  Mid to LEFT side chest pain for 2 days, painful to take a deep breath EXAM: CHEST - 2 VIEW COMPARISON:  None FINDINGS: Normal heart size. Pulmonary vascularity normal. Abnormal soft tissue density at the superior mediastinum, AP window, RIGHT paratracheal, and RIGHT hilum highly suggestive at adenopathy. Lungs clear. No infiltrate, pleural effusion or pneumothorax. Spina bifida occulta of C7.  IMPRESSION: Suspected mediastinal and RIGHT hilar adenopathy; this can be seen with multiple etiologies including infection, other inflammatory processes, lymphoproliferative disorders, and malignancy. CT chest with contrast recommended for further evaluation. Electronically Signed   By: Lavonia Dana M.D.   On: 05/07/2018 18:00   Ct Angio Chest Pe W/cm &/or Wo Cm  Result Date: 05/07/2018 CLINICAL DATA:  Abnormal chest radiograph showing an enlarged mediastinum. Chest pain for 2 days. Afebrile. EXAM: CT ANGIOGRAPHY CHEST WITH CONTRAST TECHNIQUE: Multidetector CT imaging of the chest was performed using the standard protocol during bolus administration of intravenous contrast. Multiplanar CT image reconstructions and MIPs were obtained to evaluate the vascular anatomy. CONTRAST:  142mL ISOVUE-370 IOPAMIDOL (ISOVUE-370) INJECTION 76% COMPARISON:  Chest radiograph from same day. FINDINGS: Cardiovascular: Included heart size is top normal with small pericardial effusion measuring up 1 cm in thickness. No thoracic aortic aneurysm or dissection. No acute pulmonary embolus. Patent great vessels with conventional branch pattern. Mediastinum/Nodes: Confluent appearing noncalcified soft tissue masses are noted within the superior mediastinum bilaterally. On the right, the largest conglomeration measures approximately a 9.9 x 7.1 x 5.6 cm and on the left, 5.9 x 4.1 x 4 cm. Smaller paratracheal soft tissue masses are noted. Nonpathologic size hilar lymph nodes are identified the largest on the right approximately 1.1 cm short axis. Lungs/Pleura: Scattered ground-glass opacities likely representing areas of alveolitis/pneumonitis or hypoventilatory change are identified. No dominant mass effusion or pneumothorax. Trace pleural effusions bilaterally. Upper Abdomen: No splenomegaly. The unenhanced liver, included adrenal glands and kidneys are unremarkable. Musculoskeletal: No acute nor aggressive osseous abnormality. No  axillary lymphadenopathy. Review of the MIP images confirms the above findings. IMPRESSION: Bulky, bilateral anterior superior mediastinal confluent soft tissue masses which are not calcified in appearance and which do not appear to cause occlusion or significant luminal narrowing of traversing vasculature. Leading consideration is lymphoma, possibly Hodgkin's. Given lack of differentiating soft tissue densities, a teratoma is believed less likely. Nonseminomatous germ cell tumor given presence of small effusions might also be within differential though no pulmonary lesions are visualized. Metastatic disease, infection or inflammatory process are believed less likely. Electronically Signed   By: Ashley Royalty M.D.   On: 05/07/2018 21:45   Ct Abdomen Pelvis W Contrast  Result Date: 05/08/2018 CLINICAL DATA:  Unintended weight loss. Mediastinal mass on recent chest CT. Epigastric pain EXAM: CT ABDOMEN AND PELVIS WITH CONTRAST TECHNIQUE: Multidetector CT imaging of the abdomen and pelvis was performed using the standard protocol following bolus administration of intravenous contrast. CONTRAST:  139mL OMNIPAQUE IOHEXOL 300 MG/ML  SOLN COMPARISON:  Chest CT from yesterday FINDINGS: Lower chest:  Trace left pleural effusion Hepatobiliary: No focal liver abnormality.No evidence of biliary obstruction or stone. Pancreas: Unremarkable. Spleen: Unremarkable. Adrenals/Urinary Tract: Negative adrenals. No hydronephrosis or stone. 14 mm simple appearing right renal cyst. Unremarkable bladder. Stomach/Bowel: No obstruction. No appendicitis. Formed stool throughout the colon Vascular/Lymphatic: No acute vascular abnormality. No mass or adenopathy. Reproductive:No pathologic findings. Other: No ascites or pneumoperitoneum. Musculoskeletal: No evident bone lesion. Mild disc bulge and retrolisthesis at L5-S1. IMPRESSION: No acute finding or evidence of malignancy in the abdomen. Electronically  Signed   By: Monte Fantasia M.D.    On: 05/08/2018 13:45     I have independently reviewed the above radiology studies  and reviewed the findings with the patient.   Recent Lab Findings: Lab Results  Component Value Date   WBC 11.9 (H) 05/08/2018   HGB 12.6 (L) 05/08/2018   HCT 37.6 (L) 05/08/2018   PLT 322 05/08/2018   GLUCOSE 145 (H) 05/08/2018   ALT 13 05/08/2018   AST 14 (L) 05/08/2018   NA 137 05/08/2018   K 3.4 (L) 05/08/2018   CL 105 05/08/2018   CREATININE 1.08 05/08/2018   BUN 11 05/08/2018   CO2 26 05/08/2018   TSH 3.442 05/08/2018   LDH: 105 normal   Assessment / Plan:   Anterior mediastinal mass, possible lymphoma with negative abdominal CT and no B symptoms.  I reviewed the patient's radiographic findings with him and recommended that we proceed with direct biopsy to this obtain sufficient tissue to make a diagnosis.  A parasternal exploration with direct biopsy would would be the most likely techniques to give [place] sufficient tissue to quickly make the diagnosis.  I discussed this with the patient in detail.  He is agreeable to proceed with parasternal exploration on Friday afternoon.  LDH is normal, alpha-fetoprotein and beta-hCG levels have been drawn results pending  Grace Isaac MD      [address] ,[place] [address] Office [phone]   [place]  05/08/2018 6:39 PM

## 2018-05-08 NOTE — Plan of Care (Signed)

## 2018-05-08 NOTE — Progress Notes (Signed)
  Echocardiogram 2D Echocardiogram has been performed.  David Lam 05/08/2018, 11:05 AM

## 2018-05-08 NOTE — Progress Notes (Signed)
 PROGRESS NOTE  David Lam MRN:[id number] DOB: 06/07/1996 DOA: 05/07/2018 PCP: System, Pcp Not In  HPI/Recap of past 24 hours: HPI from Dr Kakrakandy David Lam is a 22 y.o. male with no significant past medical history presents to the ER because of 48 hours of chest pain. Chest pain is mostly pleuritic in nature and positional.  Denies any exertional symptoms denies any fever/chills, shortness of breath productive cough or any hemoptysis.  Denies any diaphoresis or night sweats.  Has been having some weight loss which patient states has been deliberate.  Denies any recent travel or sick contact. In the ER, patient is found to be in sinus tachycardia.  Labs show leukocytosis, troponins negative. Chest x-ray was showing mediastinal mass for which CT angiogram of the chest was done which shows superior mediastinal mass. On-call oncologist Dr. Gorsuch was consulted by the ER physician who recommended admission and further work-up including biopsy possibly by a cardiothoracic surgeon. Pt admitted for further work up.   Today, pt still reported pleuritic chest pain, denies its worsening. Denies any SOB, abdominal pain, N/V/D/C, fever/chills.  Assessment/Plan: Active Problems:   Chest pain   Mediastinal mass  Mediastinal mass ?? Mass concerning for lymphoma Afebrile, with resolving leukocytosis Work up-HIV non-reactive, LDH 105, ESR 19, AFP and Beta hcg pending CTA chest showed: Bulky, bilateral anterior superior mediastinal confluent soft tissue masses which are not calcified in appearance and which do not appear to cause occlusion or significant luminal narrowing of traversing vasculature. Leading consideration is lymphoma, possibly Hodgkin's CT abdomen/pelvis: No acute finding or evidence of malignancy in the abdomen CTS consulted, plan for biopsy on 05/10/18 Also consulted IR to see if biopsy can be done somewhat earlier if they can assess the mass Monitor closely  Atypical chest  pain/sinus tachycardia Pleuritic, likely due to above Vs pericardial effusion/pericarditis ESR 19, TSH WNL Troponin negative, EKG shows Sinus tachy ECHO pending Monitor closely    Code Status: Full  Family Communication: Spoke with sister at bedside  Disposition Plan: To be determined, after work-up completed   Consultants:  Cardiothoracic surgery  IR  Procedures:  None  Antimicrobials:  None  DVT prophylaxis: SCDs   Objective: Vitals:   05/07/18 2200 05/07/18 2245 05/07/18 2354 05/08/18 0508  BP: 114/64  130/83 115/68  Pulse: 100 (!) 102 93 (!) 113  Resp: (!) 22 18 18   Temp:   98.5 F (36.9 C) 98 F (36.7 C)  TempSrc:   Oral Axillary  SpO2: 100% 99% 100% 98%  Weight:   79.7 kg   Height:   5' 8" (1.727 m)     Intake/Output Summary (Last 24 hours) at 05/08/2018 1411 Last data filed at 05/08/2018 0624 Gross per 24 hour  Intake 1458 ml  Output -  Net 1458 ml   Filed Weights   05/07/18 1707 05/07/18 2354  Weight: 82.1 kg 79.7 kg    Exam:   General: NAD  Cardiovascular: S1, S2 present  Respiratory: CTA B  Abdomen: Soft, nontender, nondistended, bowel sounds present  Musculoskeletal: No pedal edema bilaterally  Skin: Normal  Psychiatry: Normal mood   Data Reviewed: CBC: Recent Labs  Lab 05/07/18 1723 05/07/18 2217 05/08/18 0226  WBC 15.5*  --  11.9*  NEUTROABS  --  8.2* 9.7*  HGB 14.2  --  12.6*  HCT 42.3  --  37.6*  MCV 80.1  --  78.7*  PLT 366  --  322   Basic Metabolic Panel: Recent Labs  Lab   05/07/18 1723 05/08/18 0226  NA 137 137  K 3.9 3.4*  CL 103 105  CO2 25 26  GLUCOSE 82 145*  BUN 11 11  CREATININE 1.07 1.08  CALCIUM 9.5 9.2   GFR: Estimated Creatinine Clearance: 102.9 mL/min (by C-G formula based on SCr of 1.08 mg/dL). Liver Function Tests: Recent Labs  Lab 05/08/18 0226  AST 14*  ALT 13  ALKPHOS 76  BILITOT 1.0  PROT 7.7  ALBUMIN 3.5   No results for input(s): LIPASE, AMYLASE in the last 168  hours. No results for input(s): AMMONIA in the last 168 hours. Coagulation Profile: No results for input(s): INR, PROTIME in the last 168 hours. Cardiac Enzymes: Recent Labs  Lab 05/08/18 0544  CKTOTAL 129  TROPONINI <0.03   BNP (last 3 results) No results for input(s): PROBNP in the last 8760 hours. HbA1C: No results for input(s): HGBA1C in the last 72 hours. CBG: No results for input(s): GLUCAP in the last 168 hours. Lipid Profile: No results for input(s): CHOL, HDL, LDLCALC, TRIG, CHOLHDL, LDLDIRECT in the last 72 hours. Thyroid Function Tests: Recent Labs    05/08/18 0544  TSH 3.442   Anemia Panel: No results for input(s): VITAMINB12, FOLATE, FERRITIN, TIBC, IRON, RETICCTPCT in the last 72 hours. Urine analysis: No results found for: COLORURINE, APPEARANCEUR, LABSPEC, PHURINE, GLUCOSEU, HGBUR, BILIRUBINUR, KETONESUR, PROTEINUR, UROBILINOGEN, NITRITE, LEUKOCYTESUR Sepsis Labs: @LABRCNTIP(procalcitonin:4,lacticidven:4)  )No results found for this or any previous visit (from the past 240 hour(s)).    Studies: Dg Chest 2 View  Result Date: 05/07/2018 CLINICAL DATA:  Mid to LEFT side chest pain for 2 days, painful to take a deep breath EXAM: CHEST - 2 VIEW COMPARISON:  None FINDINGS: Normal heart size. Pulmonary vascularity normal. Abnormal soft tissue density at the superior mediastinum, AP window, RIGHT paratracheal, and RIGHT hilum highly suggestive at adenopathy. Lungs clear. No infiltrate, pleural effusion or pneumothorax. Spina bifida occulta of C7. IMPRESSION: Suspected mediastinal and RIGHT hilar adenopathy; this can be seen with multiple etiologies including infection, other inflammatory processes, lymphoproliferative disorders, and malignancy. CT chest with contrast recommended for further evaluation. Electronically Signed   By: Mark  Boles M.D.   On: 05/07/2018 18:00   Ct Angio Chest Pe W/cm &/or Wo Cm  Result Date: 05/07/2018 CLINICAL DATA:  Abnormal chest  radiograph showing an enlarged mediastinum. Chest pain for 2 days. Afebrile. EXAM: CT ANGIOGRAPHY CHEST WITH CONTRAST TECHNIQUE: Multidetector CT imaging of the chest was performed using the standard protocol during bolus administration of intravenous contrast. Multiplanar CT image reconstructions and MIPs were obtained to evaluate the vascular anatomy. CONTRAST:  100mL ISOVUE-370 IOPAMIDOL (ISOVUE-370) INJECTION 76% COMPARISON:  Chest radiograph from same day. FINDINGS: Cardiovascular: Included heart size is top normal with small pericardial effusion measuring up 1 cm in thickness. No thoracic aortic aneurysm or dissection. No acute pulmonary embolus. Patent great vessels with conventional branch pattern. Mediastinum/Nodes: Confluent appearing noncalcified soft tissue masses are noted within the superior mediastinum bilaterally. On the right, the largest conglomeration measures approximately a 9.9 x 7.1 x 5.6 cm and on the left, 5.9 x 4.1 x 4 cm. Smaller paratracheal soft tissue masses are noted. Nonpathologic size hilar lymph nodes are identified the largest on the right approximately 1.1 cm short axis. Lungs/Pleura: Scattered ground-glass opacities likely representing areas of alveolitis/pneumonitis or hypoventilatory change are identified. No dominant mass effusion or pneumothorax. Trace pleural effusions bilaterally. Upper Abdomen: No splenomegaly. The unenhanced liver, included adrenal glands and kidneys are unremarkable. Musculoskeletal: No acute nor   aggressive osseous abnormality. No axillary lymphadenopathy. Review of the MIP images confirms the above findings. IMPRESSION: Bulky, bilateral anterior superior mediastinal confluent soft tissue masses which are not calcified in appearance and which do not appear to cause occlusion or significant luminal narrowing of traversing vasculature. Leading consideration is lymphoma, possibly Hodgkin's. Given lack of differentiating soft tissue densities, a teratoma is  believed less likely. Nonseminomatous germ cell tumor given presence of small effusions might also be within differential though no pulmonary lesions are visualized. Metastatic disease, infection or inflammatory process are believed less likely. Electronically Signed   By: Ashley Royalty M.D.   On: 05/07/2018 21:45   Ct Abdomen Pelvis W Contrast  Result Date: 05/08/2018 CLINICAL DATA:  Unintended weight loss. Mediastinal mass on recent chest CT. Epigastric pain EXAM: CT ABDOMEN AND PELVIS WITH CONTRAST TECHNIQUE: Multidetector CT imaging of the abdomen and pelvis was performed using the standard protocol following bolus administration of intravenous contrast. CONTRAST:  135m OMNIPAQUE IOHEXOL 300 MG/ML  SOLN COMPARISON:  Chest CT from yesterday FINDINGS: Lower chest:  Trace left pleural effusion Hepatobiliary: No focal liver abnormality.No evidence of biliary obstruction or stone. Pancreas: Unremarkable. Spleen: Unremarkable. Adrenals/Urinary Tract: Negative adrenals. No hydronephrosis or stone. 14 mm simple appearing right renal cyst. Unremarkable bladder. Stomach/Bowel: No obstruction. No appendicitis. Formed stool throughout the colon Vascular/Lymphatic: No acute vascular abnormality. No mass or adenopathy. Reproductive:No pathologic findings. Other: No ascites or pneumoperitoneum. Musculoskeletal: No evident bone lesion. Mild disc bulge and retrolisthesis at L5-S1. IMPRESSION: No acute finding or evidence of malignancy in the abdomen. Electronically Signed   By: JMonte FantasiaM.D.   On: 05/08/2018 13:45    Scheduled Meds:  Continuous Infusions: . sodium chloride 75 mL/hr at 05/08/18 0017     LOS: 0 days     NAlma Friendly MD [facility]   If 7PM-7AM, please contact night-coverage [email or web address] 05/08/2018, 2:11 PM

## 2018-05-09 ENCOUNTER — Other Ambulatory Visit: Payer: Self-pay

## 2018-05-09 ENCOUNTER — Encounter (HOSPITAL_COMMUNITY): Payer: Self-pay | Admitting: *Deleted

## 2018-05-09 ENCOUNTER — Other Ambulatory Visit: Payer: Self-pay | Admitting: *Deleted

## 2018-05-09 DIAGNOSIS — J9859 Other diseases of mediastinum, not elsewhere classified: Secondary | ICD-10-CM

## 2018-05-09 LAB — CBC WITH DIFFERENTIAL/PLATELET
Abs Immature Granulocytes: 0.02 10*3/uL (ref 0.00–0.07)
BASOS ABS: 0.1 10*3/uL (ref 0.0–0.1)
Basophils Relative: 1 %
EOS ABS: 0.6 10*3/uL — AB (ref 0.0–0.5)
EOS PCT: 5 %
HEMATOCRIT: 34.7 % — AB (ref 39.0–52.0)
Hemoglobin: 11.9 g/dL — ABNORMAL LOW (ref 13.0–17.0)
Immature Granulocytes: 0 %
Lymphocytes Relative: 10 %
Lymphs Abs: 1.1 10*3/uL (ref 0.7–4.0)
MCH: 26.9 pg (ref 26.0–34.0)
MCHC: 34.3 g/dL (ref 30.0–36.0)
MCV: 78.5 fL — ABNORMAL LOW (ref 80.0–100.0)
Monocytes Absolute: 1 10*3/uL (ref 0.1–1.0)
Monocytes Relative: 10 %
NRBC: 0 % (ref 0.0–0.2)
Neutro Abs: 7.9 10*3/uL — ABNORMAL HIGH (ref 1.7–7.7)
Neutrophils Relative %: 74 %
PLATELETS: 282 10*3/uL (ref 150–400)
RBC: 4.42 MIL/uL (ref 4.22–5.81)
RDW: 14.6 % (ref 11.5–15.5)
WBC: 10.6 10*3/uL — AB (ref 4.0–10.5)

## 2018-05-09 LAB — BASIC METABOLIC PANEL
Anion gap: 6 (ref 5–15)
BUN: 12 mg/dL (ref 6–20)
CALCIUM: 8.9 mg/dL (ref 8.9–10.3)
CO2: 24 mmol/L (ref 22–32)
CREATININE: 0.89 mg/dL (ref 0.61–1.24)
Chloride: 105 mmol/L (ref 98–111)
Glucose, Bld: 93 mg/dL (ref 70–99)
Potassium: 3.5 mmol/L (ref 3.5–5.1)
SODIUM: 135 mmol/L (ref 135–145)

## 2018-05-09 LAB — BETA HCG QUANT (REF LAB): hCG Quant: <1 m[IU]/mL (ref 0–3)

## 2018-05-09 LAB — AFP TUMOR MARKER: AFP, Serum, Tumor Marker: <0.9 ng/mL (ref 0.0–8.3)

## 2018-05-09 NOTE — Discharge Summary (Signed)
Discharge Summary  David Lam XTK:[id number] DOB: 03-30-1996  PCP: System, Pcp Not In  Admit date: 05/07/2018 Discharge date: 05/09/2018  Time spent: 35 mins  Recommendations for Outpatient Follow-up:  1. Cardiothoracic surgery 2. Establish care with a PCP  Discharge Diagnoses:  Active Hospital Problems   Diagnosis Date Noted  . Chest pain 05/07/2018  . 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    Discharge Condition: Stable  Diet recommendation: Regular  Vitals:   05/08/18 2220 05/09/18 0457  BP: 127/76 126/77  Pulse: 96 88  Resp:  15  Temp: 98.4 F (36.9 C) 98.4 F (36.9 C)  SpO2: 99% 98%    History of present illness:  David Lam a 22 y.o.malewithno significant past medical history presents to the ER because of 48 hours of chest pain. Chest pain is mostly pleuritic in nature and positional. Denies any exertional symptoms denies any fever/chills, shortness of breath productive cough or any hemoptysis. Denies any diaphoresis or night sweats. Has been having some weight loss which patient states has been deliberate. Denies any recent travel or sick contact. In the ER, patient is found to be in sinus tachycardia. Labs show leukocytosis, troponins negative. Chest x-ray was showing mediastinal mass for which CT angiogram of the chest was done which shows superior mediastinal mass. On-call oncologist Dr. Tessie Eke consulted by the ER physician who recommended admission and further work-up including biopsy possibly by a cardiothoracic surgeon. Pt admitted for further work up.   Today, pt reports pleuritic pain is better, denies any abdominal pain, fever/chills, cough, diarrhea. Spoke in-depth with patient and CTS about the plan of care once discharged today. Pt will come in on 05/10/18 for biopsy, will stay in short stay under the care of CTS, will follow up with CTS for report of biopsy and also for  appropriate referral and follow up. CTS team will call patient and give pt extensive information about the process.  Hospital Course:  Active Problems:   Chest pain   Mediastinal mass  Mediastinal mass ?? Mass concerning for lymphoma Afebrile, with resolving leukocytosis Work up-HIV non-reactive, LDH 105, ESR 19, AFP and Beta hcg all negative CTA chest showed: Bulky, bilateral anterior superior mediastinal confluent soft tissue masses which are not calcified in appearance and which do not appear to cause occlusion or significant luminal narrowing of traversing vasculature. Leading consideration is lymphoma, possibly Hodgkin's CT abdomen/pelvis: No acute finding or evidence of malignancy in the abdomen CTS consulted, plan for biopsy on 05/10/18 Plan as mentioned above  Atypical chest pain/sinus tachycardia Pleuritic, likely due to above ESR 19, TSH WNL Troponin negative, EKG shows Sinus tachy ECHO showed EF of 55-60%, trivial pericardial effusion  Plan to establish care with PCP   Procedures:  None  Consultations:  Cardiothoracic surgery  Discharge Exam: BP 126/77 (BP Location: Right Arm)   Pulse 88   Temp 98.4 F (36.9 C) (Oral)   Resp 15   Ht _0  (1.727 m)   Wt 79.7 kg   SpO2 98%   BMI 26.72 kg/m   General: NAD  Cardiovascular: S1, S2 present Respiratory: CTAB   Discharge Instructions You were cared for by a hospitalist during your hospital stay. If you have any questions about your discharge medications or the care you received while you were in the hospital after you are discharged, you can call the unit and asked to speak with the hospitalist on call if the hospitalist that took  care of you is not available. Once you are discharged, your primary care physician will handle any further medical issues. Please note that NO REFILLS for any discharge medications will be authorized once you are discharged, as it is imperative that you return to your primary care  physician (or establish a relationship with a primary care physician if you do not have one) for your aftercare needs so that they can reassess your need for medications and monitor your lab values.   Allergies as of 05/09/2018   No Known Allergies     Medication List    TAKE these medications   diphenhydrAMINE 25 mg capsule Commonly known as:  BENADRYL Take 25 mg by mouth every 6 (six) hours as needed for allergies.      No Known Allergies Follow-up Information    Grace Isaac, MD Follow up.   Specialty:  Cardiothoracic Surgery Why:  Follow up with cardiothoracic surgery Contact information: [address] [place] [address] [phone]            The results of significant diagnostics from this hospitalization (including imaging, microbiology, ancillary and laboratory) are listed below for reference.    Significant Diagnostic Studies: Dg Chest 2 View  Result Date: 05/07/2018 CLINICAL DATA:  Mid to LEFT side chest pain for 2 days, painful to take a deep breath EXAM: CHEST - 2 VIEW COMPARISON:  None FINDINGS: Normal heart size. Pulmonary vascularity normal. Abnormal soft tissue density at the superior mediastinum, AP window, RIGHT paratracheal, and RIGHT hilum highly suggestive at adenopathy. Lungs clear. No infiltrate, pleural effusion or pneumothorax. Spina bifida occulta of C7. IMPRESSION: Suspected mediastinal and RIGHT hilar adenopathy; this can be seen with multiple etiologies including infection, other inflammatory processes, lymphoproliferative disorders, and malignancy. CT chest with contrast recommended for further evaluation. Electronically Signed   By: Lavonia Dana M.D.   On: 05/07/2018 18:00   Ct Angio Chest Pe W/cm &/or Wo Cm  Result Date: 05/07/2018 CLINICAL DATA:  Abnormal chest radiograph showing an enlarged mediastinum. Chest pain for 2 days. Afebrile. EXAM: CT ANGIOGRAPHY CHEST WITH CONTRAST TECHNIQUE: Multidetector CT imaging of  the chest was performed using the standard protocol during bolus administration of intravenous contrast. Multiplanar CT image reconstructions and MIPs were obtained to evaluate the vascular anatomy. CONTRAST:  159m ISOVUE-370 IOPAMIDOL (ISOVUE-370) INJECTION 76% COMPARISON:  Chest radiograph from same day. FINDINGS: Cardiovascular: Included heart size is top normal with small pericardial effusion measuring up 1 cm in thickness. No thoracic aortic aneurysm or dissection. No acute pulmonary embolus. Patent great vessels with conventional branch pattern. Mediastinum/Nodes: Confluent appearing noncalcified soft tissue masses are noted within the superior mediastinum bilaterally. On the right, the largest conglomeration measures approximately a 9.9 x 7.1 x 5.6 cm and on the left, 5.9 x 4.1 x 4 cm. Smaller paratracheal soft tissue masses are noted. Nonpathologic size hilar lymph nodes are identified the largest on the right approximately 1.1 cm short axis. Lungs/Pleura: Scattered ground-glass opacities likely representing areas of alveolitis/pneumonitis or hypoventilatory change are identified. No dominant mass effusion or pneumothorax. Trace pleural effusions bilaterally. Upper Abdomen: No splenomegaly. The unenhanced liver, included adrenal glands and kidneys are unremarkable. Musculoskeletal: No acute nor aggressive osseous abnormality. No axillary lymphadenopathy. Review of the MIP images confirms the above findings. IMPRESSION: Bulky, bilateral anterior superior mediastinal confluent soft tissue masses which are not calcified in appearance and which do not appear to cause occlusion or significant luminal narrowing of traversing vasculature. Leading consideration is  lymphoma, possibly Hodgkin's. Given lack of differentiating soft tissue densities, a teratoma is believed less likely. Nonseminomatous germ cell tumor given presence of small effusions might also be within differential though no pulmonary lesions are  visualized. Metastatic disease, infection or inflammatory process are believed less likely. Electronically Signed   By: Ashley Royalty M.D.   On: 05/07/2018 21:45   Ct Abdomen Pelvis W Contrast  Result Date: 05/08/2018 CLINICAL DATA:  Unintended weight loss. Mediastinal mass on recent chest CT. Epigastric pain EXAM: CT ABDOMEN AND PELVIS WITH CONTRAST TECHNIQUE: Multidetector CT imaging of the abdomen and pelvis was performed using the standard protocol following bolus administration of intravenous contrast. CONTRAST:  121m OMNIPAQUE IOHEXOL 300 MG/ML  SOLN COMPARISON:  Chest CT from yesterday FINDINGS: Lower chest:  Trace left pleural effusion Hepatobiliary: No focal liver abnormality.No evidence of biliary obstruction or stone. Pancreas: Unremarkable. Spleen: Unremarkable. Adrenals/Urinary Tract: Negative adrenals. No hydronephrosis or stone. 14 mm simple appearing right renal cyst. Unremarkable bladder. Stomach/Bowel: No obstruction. No appendicitis. Formed stool throughout the colon Vascular/Lymphatic: No acute vascular abnormality. No mass or adenopathy. Reproductive:No pathologic findings. Other: No ascites or pneumoperitoneum. Musculoskeletal: No evident bone lesion. Mild disc bulge and retrolisthesis at L5-S1. IMPRESSION: No acute finding or evidence of malignancy in the abdomen. Electronically Signed   By: JMonte FantasiaM.D.   On: 05/08/2018 13:45    Microbiology: No results found for this or any previous visit (from the past 240 hour(s)).   Labs: Basic Metabolic Panel: Recent Labs  Lab 05/07/18 1723 05/08/18 0226 05/09/18 0256  NA 137 137 135  K 3.9 3.4* 3.5  CL 103 105 105  CO2 _0 GLUCOSE 82 145* 93  BUN _1 CREATININE 1.07 1.08 0.89  CALCIUM 9.5 9.2 8.9   Liver Function Tests: Recent Labs  Lab 05/08/18 0226  AST 14*  ALT 13  ALKPHOS 76  BILITOT 1.0  PROT 7.7  ALBUMIN 3.5   No results for input(s): LIPASE, AMYLASE in the last 168 hours. No results for  input(s): AMMONIA in the last 168 hours. CBC: Recent Labs  Lab 05/07/18 1723 05/07/18 2217 05/08/18 0226 05/09/18 0256  WBC 15.5*  --  11.9* 10.6*  NEUTROABS  --  8.2* 9.7* 7.9*  HGB 14.2  --  12.6* 11.9*  HCT 42.3  --  37.6* 34.7*  MCV 80.1  --  78.7* 78.5*  PLT 366  --  322 282   Cardiac Enzymes: Recent Labs  Lab 05/08/18 0544  CKTOTAL 129  TROPONINI <0.03   BNP: BNP (last 3 results) Recent Labs    05/07/18 1723  BNP 14.2    ProBNP (last 3 results) No results for input(s): PROBNP in the last 8760 hours.  CBG: No results for input(s): GLUCAP in the last 168 hours.     Signed:  NAlma Friendly MD [facility] 05/09/2018, 12:01 PM

## 2018-05-09 NOTE — Progress Notes (Signed)
Discharged home t day accompanied by patient's sister.Discharged instructions ,personal belongings given to patient.  Verbalized understanding of instructions

## 2018-05-10 ENCOUNTER — Encounter (HOSPITAL_COMMUNITY): Payer: Self-pay

## 2018-05-10 ENCOUNTER — Inpatient Hospital Stay (HOSPITAL_COMMUNITY): Payer: BLUE CROSS/BLUE SHIELD | Admitting: Anesthesiology

## 2018-05-10 ENCOUNTER — Encounter: Payer: Self-pay | Admitting: *Deleted

## 2018-05-10 ENCOUNTER — Encounter (HOSPITAL_COMMUNITY)
Admission: RE | Disposition: A | Discharge status: Home / Self Care | Payer: Self-pay | Source: Ambulatory Visit | Attending: Cardiothoracic Surgery

## 2018-05-10 ENCOUNTER — Ambulatory Visit (HOSPITAL_COMMUNITY)
Admission: RE | Admit: 2018-05-10 | Discharge: 2018-05-10 | Disposition: A | Discharge status: Home / Self Care | Payer: BLUE CROSS/BLUE SHIELD | Source: Ambulatory Visit | Attending: Cardiothoracic Surgery | Admitting: Cardiothoracic Surgery

## 2018-05-10 DIAGNOSIS — C8112 Nodular sclerosis classical Hodgkin lymphoma, intrathoracic lymph nodes: Secondary | ICD-10-CM | POA: Diagnosis not present

## 2018-05-10 DIAGNOSIS — J9859 Other diseases of mediastinum, not elsewhere classified: Secondary | ICD-10-CM

## 2018-05-10 HISTORY — PX: MEDIASTINOTOMY CHAMBERLAIN MCNEIL: SHX5966

## 2018-05-10 LAB — URINALYSIS, ROUTINE W REFLEX MICROSCOPIC
Bacteria, UA: NONE SEEN
Bilirubin Urine: NEGATIVE
Glucose, UA: NEGATIVE mg/dL
Hgb urine dipstick: NEGATIVE
Ketones, ur: NEGATIVE mg/dL
Leukocytes, UA: NEGATIVE
Nitrite: NEGATIVE
Protein, ur: NEGATIVE mg/dL
Specific Gravity, Urine: 1.018 (ref 1.005–1.030)
pH: 8 (ref 5.0–8.0)

## 2018-05-10 LAB — APTT: aPTT: 32 seconds (ref 24–36)

## 2018-05-10 LAB — TYPE AND SCREEN
ABO/RH(D): B POS
Antibody Screen: NEGATIVE

## 2018-05-10 LAB — ABO/RH: ABO/RH(D): B POS

## 2018-05-10 LAB — PROTIME-INR
INR: 1.23
Prothrombin Time: 15.4 seconds — ABNORMAL HIGH (ref 11.4–15.2)

## 2018-05-10 LAB — SURGICAL PCR SCREEN
MRSA, PCR: NEGATIVE
Staphylococcus aureus: POSITIVE — AB

## 2018-05-10 SURGERY — MEDIASTINOTOMY, CHAMBERLAIN
Anesthesia: General

## 2018-05-10 MED ORDER — MUPIROCIN 2 % EX OINT: 1.0000 "application " | TOPICAL_OINTMENT | Freq: Once | CUTANEOUS | Status: DC

## 2018-05-10 MED ORDER — ONDANSETRON HCL 4 MG/2ML IJ SOLN
INTRAMUSCULAR | Status: DC | PRN
  Administered 2018-05-10: 4 mg via INTRAVENOUS

## 2018-05-10 MED ORDER — MIDAZOLAM HCL 2 MG/2ML IJ SOLN
INTRAMUSCULAR | Status: AC
  Filled 2018-05-10: qty 2

## 2018-05-10 MED ORDER — OXYCODONE HCL 5 MG PO TABS: 5.0000 mg | ORAL_TABLET | Freq: Once | ORAL | Status: DC | PRN

## 2018-05-10 MED ORDER — MUPIROCIN 2 % EX OINT
TOPICAL_OINTMENT | CUTANEOUS | Status: AC
  Filled 2018-05-10: qty 22

## 2018-05-10 MED ORDER — LACTATED RINGERS IV SOLN
INTRAVENOUS | Status: DC
  Administered 2018-05-10: 12:00:00 via INTRAVENOUS

## 2018-05-10 MED ORDER — DEXAMETHASONE SODIUM PHOSPHATE 10 MG/ML IJ SOLN
INTRAMUSCULAR | Status: DC | PRN
  Administered 2018-05-10: 10 mg via INTRAVENOUS

## 2018-05-10 MED ORDER — PROPOFOL 10 MG/ML IV BOLUS
INTRAVENOUS | Status: DC | PRN
  Administered 2018-05-10: 200 mg via INTRAVENOUS

## 2018-05-10 MED ORDER — FENTANYL CITRATE (PF) 250 MCG/5ML IJ SOLN
INTRAMUSCULAR | Status: AC
  Filled 2018-05-10: qty 5

## 2018-05-10 MED ORDER — SUCCINYLCHOLINE CHLORIDE 200 MG/10ML IV SOSY
PREFILLED_SYRINGE | INTRAVENOUS | Status: AC
  Filled 2018-05-10: qty 10

## 2018-05-10 MED ORDER — HEMOSTATIC AGENTS (NO CHARGE) OPTIME
TOPICAL | Status: DC | PRN
  Administered 2018-05-10: 1 via TOPICAL

## 2018-05-10 MED ORDER — PHENYLEPHRINE HCL 10 MG/ML IJ SOLN
INTRAMUSCULAR | Status: AC
  Filled 2018-05-10: qty 1

## 2018-05-10 MED ORDER — FENTANYL CITRATE (PF) 100 MCG/2ML IJ SOLN: 25.0000 ug | INTRAMUSCULAR | Status: DC | PRN

## 2018-05-10 MED ORDER — TRAMADOL HCL 50 MG PO TABS: 50.0000 mg | ORAL_TABLET | Freq: Four times a day (QID) | ORAL | 0 refills | Status: DC | PRN

## 2018-05-10 MED ORDER — ONDANSETRON HCL 4 MG/2ML IJ SOLN
INTRAMUSCULAR | Status: AC
  Filled 2018-05-10: qty 2

## 2018-05-10 MED ORDER — PROTAMINE SULFATE 10 MG/ML IV SOLN
INTRAVENOUS | Status: AC
  Filled 2018-05-10: qty 25

## 2018-05-10 MED ORDER — FENTANYL CITRATE (PF) 100 MCG/2ML IJ SOLN
INTRAMUSCULAR | Status: DC | PRN
  Administered 2018-05-10: 150 ug via INTRAVENOUS
  Administered 2018-05-10: 50 ug via INTRAVENOUS

## 2018-05-10 MED ORDER — PROPOFOL 10 MG/ML IV BOLUS
INTRAVENOUS | Status: AC
  Filled 2018-05-10: qty 20

## 2018-05-10 MED ORDER — 0.9 % SODIUM CHLORIDE (POUR BTL) OPTIME
TOPICAL | Status: DC | PRN
  Administered 2018-05-10: 1000 mL

## 2018-05-10 MED ORDER — LIDOCAINE 2% (20 MG/ML) 5 ML SYRINGE
INTRAMUSCULAR | Status: AC
  Filled 2018-05-10: qty 5

## 2018-05-10 MED ORDER — PHENYLEPHRINE 40 MCG/ML (10ML) SYRINGE FOR IV PUSH (FOR BLOOD PRESSURE SUPPORT)
PREFILLED_SYRINGE | INTRAVENOUS | Status: DC | PRN
  Administered 2018-05-10 (×2): 40 ug via INTRAVENOUS
  Administered 2018-05-10: 80 ug via INTRAVENOUS
  Administered 2018-05-10 (×3): 40 ug via INTRAVENOUS

## 2018-05-10 MED ORDER — OXYCODONE HCL 5 MG/5ML PO SOLN: 5.0000 mg | Freq: Once | ORAL | Status: DC | PRN

## 2018-05-10 MED ORDER — DEXAMETHASONE SODIUM PHOSPHATE 10 MG/ML IJ SOLN
INTRAMUSCULAR | Status: AC
  Filled 2018-05-10: qty 1

## 2018-05-10 MED ORDER — SODIUM CHLORIDE 0.9 % IV SOLN
INTRAVENOUS | Status: DC | PRN
  Administered 2018-05-10: 25 ug/min via INTRAVENOUS

## 2018-05-10 MED ORDER — CEFAZOLIN SODIUM-DEXTROSE 2-4 GM/100ML-% IV SOLN
2.0000 g | INTRAVENOUS | Status: AC
  Administered 2018-05-10: 2 g via INTRAVENOUS
  Filled 2018-05-10: qty 100

## 2018-05-10 MED ORDER — MIDAZOLAM HCL 5 MG/5ML IJ SOLN
INTRAMUSCULAR | Status: DC | PRN
  Administered 2018-05-10: 2 mg via INTRAVENOUS

## 2018-05-10 MED ORDER — PHENYLEPHRINE 40 MCG/ML (10ML) SYRINGE FOR IV PUSH (FOR BLOOD PRESSURE SUPPORT)
PREFILLED_SYRINGE | INTRAVENOUS | Status: AC
  Filled 2018-05-10: qty 10

## 2018-05-10 MED ORDER — LIDOCAINE 2% (20 MG/ML) 5 ML SYRINGE
INTRAMUSCULAR | Status: DC | PRN
  Administered 2018-05-10: 80 mg via INTRAVENOUS

## 2018-05-10 MED ORDER — ALBUTEROL SULFATE HFA 108 (90 BASE) MCG/ACT IN AERS
INHALATION_SPRAY | RESPIRATORY_TRACT | Status: AC
  Filled 2018-05-10: qty 6.7

## 2018-05-10 MED ORDER — SUCCINYLCHOLINE CHLORIDE 200 MG/10ML IV SOSY
PREFILLED_SYRINGE | INTRAVENOUS | Status: DC | PRN
  Administered 2018-05-10: 120 mg via INTRAVENOUS

## 2018-05-10 MED ORDER — ROCURONIUM BROMIDE 50 MG/5ML IV SOSY
PREFILLED_SYRINGE | INTRAVENOUS | Status: AC
  Filled 2018-05-10: qty 5

## 2018-05-10 MED ORDER — DEXMEDETOMIDINE HCL 200 MCG/2ML IV SOLN
INTRAVENOUS | Status: DC | PRN
  Administered 2018-05-10 (×3): 8 ug via INTRAVENOUS

## 2018-05-10 MED ORDER — ONDANSETRON HCL 4 MG/2ML IJ SOLN: 4.0000 mg | Freq: Four times a day (QID) | INTRAMUSCULAR | Status: DC | PRN

## 2018-05-10 MED ORDER — SUGAMMADEX SODIUM 200 MG/2ML IV SOLN
INTRAVENOUS | Status: DC | PRN
  Administered 2018-05-10: 200 mg via INTRAVENOUS

## 2018-05-10 MED ORDER — ROCURONIUM BROMIDE 50 MG/5ML IV SOSY
PREFILLED_SYRINGE | INTRAVENOUS | Status: DC | PRN
  Administered 2018-05-10: 20 mg via INTRAVENOUS
  Administered 2018-05-10: 30 mg via INTRAVENOUS
  Administered 2018-05-10: 10 mg via INTRAVENOUS

## 2018-05-10 MED ORDER — ALBUTEROL SULFATE HFA 108 (90 BASE) MCG/ACT IN AERS
INHALATION_SPRAY | RESPIRATORY_TRACT | Status: DC | PRN
  Administered 2018-05-10 (×2): 4 via RESPIRATORY_TRACT

## 2018-05-10 SURGICAL SUPPLY — 39 items
CANISTER SUCT 3000ML PPV (MISCELLANEOUS) ×2 IMPLANT
CLIP VESOCCLUDE MED 6/CT (CLIP) ×2 IMPLANT
CONT SPEC 4OZ CLIKSEAL STRL BL (MISCELLANEOUS) ×4 IMPLANT
COVER SURGICAL LIGHT HANDLE (MISCELLANEOUS) ×2 IMPLANT
COVER WAND RF STERILE (DRAPES) ×2 IMPLANT
DERMABOND ADVANCED (GAUZE/BANDAGES/DRESSINGS) ×1
DERMABOND ADVANCED .7 DNX12 (GAUZE/BANDAGES/DRESSINGS) ×1 IMPLANT
DRAPE CHEST BREAST 15X10 FENES (DRAPES) ×2 IMPLANT
DRAPE INCISE IOBAN 66X45 STRL (DRAPES) ×2 IMPLANT
DRSG AQUACEL AG ADV 3.5X14 (GAUZE/BANDAGES/DRESSINGS) ×2 IMPLANT
ELECT BLADE 4.0 EZ CLEAN MEGAD (MISCELLANEOUS) ×2
ELECT REM PT RETURN 9FT ADLT (ELECTROSURGICAL) ×2
ELECTRODE BLDE 4.0 EZ CLN MEGD (MISCELLANEOUS) ×1 IMPLANT
ELECTRODE REM PT RTRN 9FT ADLT (ELECTROSURGICAL) ×1 IMPLANT
GAUZE SPONGE 4X4 12PLY STRL (GAUZE/BANDAGES/DRESSINGS) ×2 IMPLANT
GLOVE BIO SURGEON STRL SZ 6.5 (GLOVE) ×4 IMPLANT
GLOVE BIOGEL PI IND STRL 6 (GLOVE) ×1 IMPLANT
GLOVE BIOGEL PI INDICATOR 6 (GLOVE) ×1
GOWN STRL REUS W/ TWL LRG LVL3 (GOWN DISPOSABLE) ×2 IMPLANT
GOWN STRL REUS W/TWL LRG LVL3 (GOWN DISPOSABLE) ×2
HEMOSTAT SURGICEL 2X14 (HEMOSTASIS) IMPLANT
KIT BASIN OR (CUSTOM PROCEDURE TRAY) ×2 IMPLANT
KIT TURNOVER KIT B (KITS) ×2 IMPLANT
LIGACLIP MED TITANIUM (CLIP) ×2 IMPLANT
LOOP VESSEL MAXI BLUE (MISCELLANEOUS) ×2 IMPLANT
NS IRRIG 1000ML POUR BTL (IV SOLUTION) ×2 IMPLANT
PACK GENERAL/GYN (CUSTOM PROCEDURE TRAY) ×2 IMPLANT
PAD ARMBOARD 7.5X6 YLW CONV (MISCELLANEOUS) ×4 IMPLANT
SPONGE INTESTINAL PEANUT (DISPOSABLE) IMPLANT
SPONGE LAP 4X18 RFD (DISPOSABLE) ×2 IMPLANT
STAPLER VISISTAT 35W (STAPLE) IMPLANT
SUT VIC AB 3-0 SH 18 (SUTURE) ×4 IMPLANT
SUT VICRYL 4-0 PS2 18IN ABS (SUTURE) ×2 IMPLANT
SWAB COLLECTION DEVICE MRSA (MISCELLANEOUS) IMPLANT
SWAB CULTURE ESWAB REG 1ML (MISCELLANEOUS) IMPLANT
SYR 10ML LL (SYRINGE) ×2 IMPLANT
TOWEL GREEN STERILE (TOWEL DISPOSABLE) ×2 IMPLANT
TOWEL GREEN STERILE FF (TOWEL DISPOSABLE) ×2 IMPLANT
WATER STERILE IRR 1000ML POUR (IV SOLUTION) ×2 IMPLANT

## 2018-05-10 NOTE — Progress Notes (Signed)
Oncology Nurse Navigator Documentation  Oncology Nurse Navigator Flowsheets 05/10/2018  Navigator Location [facility]  Referral date to RadOnc/MedOnc 05/10/2018  Navigator Encounter Type Other/I received referral from Dr. Servando Snare today.  I updated new patient coordinator to call patient and have him to be seen next week with Dr. Irene Limbo.    Confirmed Diagnosis Date 05/10/2018  Treatment Phase Abnormal Scans  Barriers/Navigation Needs Coordination of Care  Interventions Coordination of Care  Coordination of Care Other  Acuity Level 2  Time Spent with Patient 30

## 2018-05-10 NOTE — Discharge Instructions (Signed)
Tissue Adhesive Wound Care °Some cuts and wounds can be closed with skin glue (tissue adhesive). Skin glue holds the skin together and helps your wound heal faster. Skin glue goes away on its own as your wound gets better. °Follow these instructions at home: °Wound care ° °· Showers are allowed 24 hours after treatment. Do not soak the wound in water. Do not take baths, swim, or use hot tubs. Do not use soaps or creams on your wound. °· If a bandage (dressing) was put on the wound: °? Wash your hands with soap and water before you change your bandage. °? Change the bandage as often as told by your doctor. °? Leave skin glue in place. It will fall off on its own after 7-10 days. °? Keep the bandage dry. °· Do not scratch, rub, or pick at the skin glue. ° ° ° °· Do not put tape over the skin glue. The skin glue could come off when you take the tape off. °· Protect the wound from another injury. °· Protect the wound from sun and tanning beds. °General instructions °· Take over-the-counter and prescription medicines only as told by your doctor. °· Keep all follow-up visits as told by your doctor. This is important. °Get help right away if: °· Your wound is red, puffy (swollen), hot, or tender. °· You get a rash after the glue is put on. °· You have more pain in the wound. °· You have a red streak going away from the wound. °· You have yellowish-white fluid (pus) coming from the wound. °· You have more bleeding. °· You have a fever. °· You have chills and you start to shake. °· You notice a bad smell coming from the wound. °· Your wound or skin glue breaks open. °This information is not intended to replace advice given to you by your health care provider. Make sure you discuss any questions you have with your health care provider. °Document Released: 04/18/2008 Document Revised: 06/02/2016 Document Reviewed: 06/02/2016 °[employer] Interactive Patient Education © 2017 [employer] ° °

## 2018-05-10 NOTE — H&P (Signed)
[address]       [place] [address]             [phone]                    Oriel Skare Erda Medical Record #[id number] Date of Birth: 02-18-1996  Referring: No ref. provider found Primary Care: System, Pcp Not In Primary Cardiologist: No primary care provider on file.  Chief Complaint:    No chief complaint on file.   History of Present Illness:    David Lam 22 y.o. male is seen because of abnormal CT scan of the chest.  The patient gives a history of mild chest discomfort of several days duration which led him to go to urgent care.  Chest x-ray was done, because of mediastinal mass the patient was sent to the emergency room for admission.  Patient was admitted last night CT of the chest was done last night CT of the abdomen and pelvis done this morning.  Patient denies fever chills night sweats or weight loss.    Current Activity/ Functional Status:  Patient is independent with mobility/ambulation, transfers, ADL's, IADL's.   Zubrod Score: At the time of surgery this patient's most appropriate activity status/level should be described as: [x]     0    Normal activity, no symptoms []     1    Restricted in physical strenuous activity but ambulatory, able to do out light work []     2    Ambulatory and capable of self care, unable to do work activities, up and about               >50 % of waking hours                              []     3    Only limited self care, in bed greater than 50% of waking hours []     4    Completely disabled, no self care, confined to bed or chair []     5    Moribund   History reviewed. No pertinent past medical history.  Past Surgical History:  Procedure Laterality Date  . ANKLE SURGERY Left    cleaned up   . FRACTURE SURGERY Left    arm  . ORIF FINGER / THUMB FRACTURE Left     History reviewed. No pertinent family history.  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 Frequency: Never     No Known Allergies  Current Facility-Administered Medications  Medication Dose Route Frequency Provider Last Rate Last Dose  . ceFAZolin (ANCEF) IVPB 2g/100 mL premix  2 g Intravenous 30 min Pre-Op Grace Isaac, MD      . lactated ringers infusion   Intravenous Continuous Albertha Ghee, MD 10 mL/hr at 05/10/18 1217    . mupirocin ointment (BACTROBAN) 2 % 1 application  1 application Topical Once Grace Isaac, MD      . mupirocin ointment (BACTROBAN) 2 %  Review of Systems:     Cardiac Review of Systems: [Y] = yes  or   [ N ] = no   Chest Pain [  y  ]  Resting SOB [n   ] Exertional SOB  [n  ]  Orthopnea [ n ]   Pedal Edema [n   ]    Palpitations [  n] Syncope  Florencio.Farrier  ]   Presyncope [  n ]   General Review of Systems: [Y] = yes [  ]=no Constitional: recent weight change [ n ];  Wt loss over the last 3 months [   ] anorexia [  ]; fatigue [  ]; nausea [  ]; night sweats [  ]; fever [  ]; or chills [  ];           Eye : blurred vision [  ]; diplopia [   ]; vision changes [  ];  Amaurosis fugax[  ]; Resp: cough [  ];  wheezing[  ];  hemoptysis[  ]; shortness of breath[  ]; paroxysmal nocturnal dyspnea[  ]; dyspnea on exertion[  ]; or orthopnea[  ];  GI:  gallstones[  ], vomiting[  ];  dysphagia[  ]; melena[  ];  hematochezia [  ]; heartburn[  ];   Hx of  Colonoscopy[  ]; GU: kidney stones [  ]; hematuria[  ];   dysuria [  ];  nocturia[  ];  history of     obstruction [  ]; urinary frequency [  ]             Skin: rash, swelling[  ];, hair loss[  ];  peripheral edema[  ];  or itching[  ]; Musculosketetal: myalgias[  ];  joint swelling[  ];  joint  erythema[  ];  joint pain[  ];  back pain[  ];  Heme/Lymph: bruising[  ];  bleeding[  ];  anemia[  ];  Neuro: TIA[  ];  headaches[  ];  stroke[  ];  vertigo[  ];  seizures[  ];   paresthesias[  ];  difficulty walking[  ];  Psych:depression[  ]; anxiety[  ];  Endocrine: diabetes[  ];  thyroid dysfunction[  ];  Immunizations: Flu up to date [  ]; Pneumococcal up to date [  ];  Other: No fever chills night sweats no weight loss     PHYSICAL EXAMINATION: BP 124/73   Pulse 99   Temp 98.7 F (37.1 C) (Oral)   Resp 18   Ht 5' 8.5" (1.74 m)   Wt 82.1 kg   SpO2 98%   BMI 27.12 kg/m  General appearance: alert, cooperative, appears stated age and no distress Head: Normocephalic, without obvious abnormality, atraumatic Neck: no adenopathy, no carotid bruit, no JVD, supple, symmetrical, trachea midline and thyroid not enlarged, symmetric, no tenderness/mass/nodules Lymph nodes: Cervical, supraclavicular, and axillary nodes normal. Resp: clear to auscultation bilaterally Back: symmetric, no curvature. ROM normal. No CVA tenderness. Cardio: regular rate and rhythm, S1, S2 normal, no murmur, click, rub or gallop GI: soft, non-tender; bowel sounds normal; no masses,  no organomegaly Extremities: extremities normal, atraumatic, no cyanosis or edema Neurologic: Grossly normal  Diagnostic Studies & Laboratory data:     Recent Radiology Findings:   Dg Chest 2 View  Result Date: 05/07/2018 CLINICAL DATA:  Mid to LEFT side chest pain for 2 days, painful to take a deep breath EXAM: CHEST - 2 VIEW COMPARISON:  None FINDINGS: Normal heart size.  Pulmonary vascularity normal. Abnormal soft tissue density at the superior mediastinum, AP window, RIGHT paratracheal, and RIGHT hilum highly suggestive at adenopathy. Lungs clear. No infiltrate, pleural effusion or pneumothorax. Spina bifida occulta of C7. IMPRESSION: Suspected mediastinal and RIGHT hilar adenopathy; this can be seen with multiple etiologies  including infection, other inflammatory processes, lymphoproliferative disorders, and malignancy. CT chest with contrast recommended for further evaluation. Electronically Signed   By: Lavonia Dana M.D.   On: 05/07/2018 18:00   Ct Angio Chest Pe W/cm &/or Wo Cm  Result Date: 05/07/2018 CLINICAL DATA:  Abnormal chest radiograph showing an enlarged mediastinum. Chest pain for 2 days. Afebrile. EXAM: CT ANGIOGRAPHY CHEST WITH CONTRAST TECHNIQUE: Multidetector CT imaging of the chest was performed using the standard protocol during bolus administration of intravenous contrast. Multiplanar CT image reconstructions and MIPs were obtained to evaluate the vascular anatomy. CONTRAST:  152mL ISOVUE-370 IOPAMIDOL (ISOVUE-370) INJECTION 76% COMPARISON:  Chest radiograph from same day. FINDINGS: Cardiovascular: Included heart size is top normal with small pericardial effusion measuring up 1 cm in thickness. No thoracic aortic aneurysm or dissection. No acute pulmonary embolus. Patent great vessels with conventional branch pattern. Mediastinum/Nodes: Confluent appearing noncalcified soft tissue masses are noted within the superior mediastinum bilaterally. On the right, the largest conglomeration measures approximately a 9.9 x 7.1 x 5.6 cm and on the left, 5.9 x 4.1 x 4 cm. Smaller paratracheal soft tissue masses are noted. Nonpathologic size hilar lymph nodes are identified the largest on the right approximately 1.1 cm short axis. Lungs/Pleura: Scattered ground-glass opacities likely representing areas of alveolitis/pneumonitis or hypoventilatory change are identified. No dominant mass effusion or pneumothorax. Trace pleural effusions bilaterally. Upper Abdomen: No splenomegaly. The unenhanced liver, included adrenal glands and kidneys are unremarkable. Musculoskeletal: No acute nor aggressive osseous abnormality. No axillary lymphadenopathy. Review of the MIP images confirms the above findings. IMPRESSION: Bulky, bilateral  anterior superior mediastinal confluent soft tissue masses which are not calcified in appearance and which do not appear to cause occlusion or significant luminal narrowing of traversing vasculature. Leading consideration is lymphoma, possibly Hodgkin's. Given lack of differentiating soft tissue densities, a teratoma is believed less likely. Nonseminomatous germ cell tumor given presence of small effusions might also be within differential though no pulmonary lesions are visualized. Metastatic disease, infection or inflammatory process are believed less likely. Electronically Signed   By: Ashley Royalty M.D.   On: 05/07/2018 21:45   Ct Abdomen Pelvis W Contrast  Result Date: 05/08/2018 CLINICAL DATA:  Unintended weight loss. Mediastinal mass on recent chest CT. Epigastric pain EXAM: CT ABDOMEN AND PELVIS WITH CONTRAST TECHNIQUE: Multidetector CT imaging of the abdomen and pelvis was performed using the standard protocol following bolus administration of intravenous contrast. CONTRAST:  145mL OMNIPAQUE IOHEXOL 300 MG/ML  SOLN COMPARISON:  Chest CT from yesterday FINDINGS: Lower chest:  Trace left pleural effusion Hepatobiliary: No focal liver abnormality.No evidence of biliary obstruction or stone. Pancreas: Unremarkable. Spleen: Unremarkable. Adrenals/Urinary Tract: Negative adrenals. No hydronephrosis or stone. 14 mm simple appearing right renal cyst. Unremarkable bladder. Stomach/Bowel: No obstruction. No appendicitis. Formed stool throughout the colon Vascular/Lymphatic: No acute vascular abnormality. No mass or adenopathy. Reproductive:No pathologic findings. Other: No ascites or pneumoperitoneum. Musculoskeletal: No evident bone lesion. Mild disc bulge and retrolisthesis at L5-S1. IMPRESSION: No acute finding or evidence of malignancy in the abdomen. Electronically Signed   By: Monte Fantasia M.D.   On: 05/08/2018 13:45     I have independently reviewed the above radiology studies  and  reviewed the  findings with the patient.   Recent Lab Findings: Lab Results  Component Value Date   WBC 10.6 (H) 05/09/2018   HGB 11.9 (L) 05/09/2018   HCT 34.7 (L) 05/09/2018   PLT 282 05/09/2018   GLUCOSE 93 05/09/2018   ALT 13 05/08/2018   AST 14 (L) 05/08/2018   NA 135 05/09/2018   K 3.5 05/09/2018   CL 105 05/09/2018   CREATININE 0.89 05/09/2018   BUN 12 05/09/2018   CO2 24 05/09/2018   TSH 3.442 05/08/2018   INR 1.23 05/10/2018   LDH: 105 normal   Assessment / Plan:   Anterior mediastinal mass, possible lymphoma with negative abdominal CT and no B symptoms.  I reviewed the patient's radiographic findings with him and recommended that we proceed with direct biopsy to this obtain sufficient tissue to make a diagnosis.  A parasternal exploration with direct biopsy would would be the most likely techniques to give [place] sufficient tissue to quickly make the diagnosis.  I discussed this with the patient in detail.  LDH is normal, alpha-fetoprotein and beta-hCG levels have been drawn results pending  The goals risks and alternatives of the planned surgical procedure Procedure(s): MEDIASTINOTOMY CHAMBERLAIN MCNEIL (N/A)  have been discussed with the patient in detail. The risks of the procedure including death, infection, stroke, myocardial infarction, bleeding, blood transfusion have all been discussed specifically.  I have quoted David Lam a 1 % of perioperative mortality and a complication rate as high as 10 %. The patient's questions have been answered.David Lam is willing  to proceed with the planned procedure.  Grace Isaac MD      [address] [place] [address] Office [phone]   Beeper [phone]  05/10/2018 1:29 PM

## 2018-05-10 NOTE — Anesthesia Procedure Notes (Signed)
Procedure Name: Intubation Date/Time: 05/10/2018 2:04 PM Performed by: Alain Marion, CRNA Pre-anesthesia Checklist: Patient identified, Emergency Drugs available, Suction available and Patient being monitored Patient Re-evaluated:Patient Re-evaluated prior to induction Oxygen Delivery Method: Circle System Utilized Preoxygenation: Pre-oxygenation with 100% oxygen Induction Type: IV induction, Rapid sequence and Cricoid Pressure applied Ventilation: Mask ventilation without difficulty Laryngoscope Size: Miller and 2 Grade View: Grade I Tube type: Oral Tube size: 8.0 mm Number of attempts: 1 Airway Equipment and Method: Stylet and Oral airway Placement Confirmation: ETT inserted through vocal cords under direct vision,  positive ETCO2 and breath sounds checked- equal and bilateral Secured at: 22 cm Tube secured with: Tape Dental Injury: Teeth and Oropharynx as per pre-operative assessment

## 2018-05-10 NOTE — Brief Op Note (Addendum)
      [address]       [place] [address]             [phone]     05/10/2018  3:32 PM  PATIENT:  Casimer Lanius  22 y.o. 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   Delay start of Pharmacological VTE agent (>24hrs) due to surgical blood loss or risk of bleeding: yes

## 2018-05-10 NOTE — Anesthesia Preprocedure Evaluation (Signed)
Anesthesia Evaluation  Patient identified by MRN, date of birth, ID band Patient awake    Reviewed: Allergy & Precautions, H&P , NPO status , Patient's Chart, lab work & pertinent test results  Airway Mallampati: I   Neck ROM: full    Dental   Pulmonary  Mediastinal mass    breath sounds clear to auscultation       Cardiovascular negative cardio ROS   Rhythm:regular Rate:Normal     Neuro/Psych    GI/Hepatic   Endo/Other    Renal/GU      Musculoskeletal   Abdominal   Peds  Hematology   Anesthesia Other Findings   Reproductive/Obstetrics                             Anesthesia Physical Anesthesia Plan  ASA: I  Anesthesia Plan: General   Post-op Pain Management:    Induction: Intravenous  PONV Risk Score and Plan: 2 and Ondansetron, Dexamethasone, Midazolam and Treatment may vary due to age or medical condition  Airway Management Planned: Oral ETT  Additional Equipment:   Intra-op Plan:   Post-operative Plan: Extubation in OR  Informed Consent: I have reviewed the patients History and Physical, chart, labs and discussed the procedure including the risks, benefits and alternatives for the proposed anesthesia with the patient or authorized representative who has indicated his/her understanding and acceptance.     Plan Discussed with: CRNA, Anesthesiologist and Surgeon  Anesthesia Plan Comments:         Anesthesia Quick Evaluation

## 2018-05-10 NOTE — Transfer of Care (Signed)
Immediate Anesthesia Transfer of Care Note  Patient: David Lam  Procedure(s) Performed: Mediastinal mass biopsy  (N/A )  Patient Location: PACU  Anesthesia Type:General  Level of Consciousness: drowsy  Airway & Oxygen Therapy: Patient Spontanous Breathing and Patient connected to face mask oxygen  Post-op Assessment: Report given to RN and Post -op Vital signs reviewed and stable  Post vital signs: Reviewed and stable  Last Vitals:  Vitals Value Taken Time  BP 95/50 05/10/2018  3:59 PM  Temp    Pulse 105 05/10/2018  4:01 PM  Resp 21 05/10/2018  4:01 PM  SpO2 97 % 05/10/2018  4:01 PM  Vitals shown include unvalidated device data.  Last Pain:  Vitals:   05/10/18 1200  TempSrc:   PainSc: 0-No pain      Patients Stated Pain Goal: 2 (34/14/43 6016)  Complications: No apparent anesthesia complications

## 2018-05-13 ENCOUNTER — Encounter (HOSPITAL_COMMUNITY): Payer: Self-pay | Admitting: Cardiothoracic Surgery

## 2018-05-13 ENCOUNTER — Telehealth: Payer: Self-pay | Admitting: *Deleted

## 2018-05-13 NOTE — Anesthesia Postprocedure Evaluation (Signed)
Anesthesia Post Note  Patient: David Lam  Procedure(s) Performed: Mediastinal mass biopsy  (N/A )     Patient location during evaluation: PACU Anesthesia Type: General Level of consciousness: awake and alert Pain management: pain level controlled Vital Signs Assessment: post-procedure vital signs reviewed and stable Respiratory status: spontaneous breathing, nonlabored ventilation, respiratory function stable and patient connected to nasal cannula oxygen Cardiovascular status: blood pressure returned to baseline and stable Postop Assessment: no apparent nausea or vomiting Anesthetic complications: no    Last Vitals:  Vitals:   05/10/18 1700 05/10/18 1703  BP: 100/68 105/68  Pulse: (!) 105 (!) 105  Resp: (!) 23   Temp: (!) 36.3 C   SpO2: 94% 95%    Last Pain:  Vitals:   05/10/18 1630  TempSrc:   PainSc: Asleep                 Tuff Clabo S

## 2018-05-13 NOTE — Telephone Encounter (Signed)
Oncology Nurse Navigator Documentation  Oncology Nurse Navigator Flowsheets 05/13/2018  Navigator Location [facility]  Navigator Encounter Type Telephone/I called patient to updated him on his appt here at the cancer center Thursday.  I was unable to reach or leave a vm message due to mail box full.    Telephone Outgoing Call  Treatment Phase Pre-Tx/Tx Discussion  Barriers/Navigation Needs Education  Education Other  Interventions Education  Acuity Level 1  Time Spent with Patient 15

## 2018-05-13 NOTE — Op Note (Signed)
NAMEAPOSTOLOS, BLAGG MEDICAL RECORD [id number] ACCOUNT [id number] DATE OF BIRTH:01-12-1996 FACILITY: [facility] LOCATION: [facility] PHYSICIAN:Trase Bunda Maryruth Bun, MD  OPERATIVE REPORT  DATE OF PROCEDURE:  05/10/2018  PREOPERATIVE DIAGNOSIS:  Anterior mediastinal mass suspicious for lymphoma.  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  Final path pending.  PROCEDURE PERFORMED:  Left scalene node biopsy.  SURGEON:  Lanelle Bal, MD  FIRST ASSISTANT:  Nicholes Rough, PA.  BRIEF HISTORY:  The patient is a 22 year old male who presented several days prior to the emergency room with vague chest discomfort.  Chest x-ray and CT scan confirmed a large anterior mediastinal mass suspicious for lymphoma.  The patient had no B  symptoms.  Alpha fetoprotein, beta hCG were negative.  LDH was normal.  The patient was seen by thoracic surgery and recommended that we proceed with biopsy.    On physical exam with the patient awake.  Lymph nodes in the neck were not palpably enlarged.  We initially discussed with the patient proceeding with a right parasternal exploration and direct biopsy to obtain enough tissue to definitively make a  diagnosis of lymphoma and have sufficient tissue for flow cytometry and other studies.  The patient agreed and signed informed consent.  DESCRIPTION OF PROCEDURE:  The patient underwent general endotracheal anesthesia without incident.  With the patient asleep and intubated, before prepping the patient, we could feel in the left neck questionable nodularity and for this reason, we changed  our planned slightly, prepped the neck and chest and proceeded with a small incision over the left sternocleidomastoid muscle and the super supraclavicular fossa.  Dissection was carried down and a probable mass in this area was excised at least 1 x 1  cm and submitted to pathology for examination.  An additional portion of this from the  same area was also submitted in a separate cup.  Initial frozen section confirmed adequate tissue for diagnosis and most likely after discussion with Dr. Saralyn Pilar,  Hodgkin's lymphoma, possibly non-nodular sclerosing type.  Final path pending.  The amount of tissue obtained was sufficient for the diagnosis.  With the diagnosis made from this, we did not proceed with the right parasternal exploration.  Incision was  closed with interrupted 3-0 Vicryl and a 4-0 subcuticular in the skin edges.  Dermabond was applied.    Sponge and needle count was reported as correct at completion of the procedure.  Scanning of the patient for laps gave a clear confirmation.  Blood loss was minimal.    The patient was awakened in the operating room and extubated and transferred to the recovery room for further postoperative care.  AN/NUANCE  D:05/12/2018 T:05/13/2018 JOB:[id number]

## 2018-05-16 ENCOUNTER — Encounter: Payer: Self-pay | Admitting: Hematology

## 2018-05-16 ENCOUNTER — Inpatient Hospital Stay: Payer: BLUE CROSS/BLUE SHIELD | Attending: Hematology | Admitting: Hematology

## 2018-05-16 ENCOUNTER — Telehealth: Payer: Self-pay

## 2018-05-16 ENCOUNTER — Inpatient Hospital Stay: Payer: BLUE CROSS/BLUE SHIELD

## 2018-05-16 VITALS — BP 134/80 | HR 106 | Temp 97.7°F | Resp 18 | Ht 68.5 in | Wt 177.9 lb

## 2018-05-16 DIAGNOSIS — C8118 Nodular sclerosis classical Hodgkin lymphoma, lymph nodes of multiple sites: Secondary | ICD-10-CM | POA: Insufficient documentation

## 2018-05-16 LAB — CBC WITH DIFFERENTIAL/PLATELET
ABS IMMATURE GRANULOCYTES: 0.03 10*3/uL (ref 0.00–0.07)
Basophils Absolute: 0.1 10*3/uL (ref 0.0–0.1)
Basophils Relative: 0 %
EOS PCT: 2 %
Eosinophils Absolute: 0.3 10*3/uL (ref 0.0–0.5)
HEMATOCRIT: 40.7 % (ref 39.0–52.0)
HEMOGLOBIN: 13.7 g/dL (ref 13.0–17.0)
IMMATURE GRANULOCYTES: 0 %
LYMPHS ABS: 0.9 10*3/uL (ref 0.7–4.0)
LYMPHS PCT: 6 %
MCH: 26.9 pg (ref 26.0–34.0)
MCHC: 33.7 g/dL (ref 30.0–36.0)
MCV: 79.8 fL — ABNORMAL LOW (ref 80.0–100.0)
Monocytes Absolute: 0.7 10*3/uL (ref 0.1–1.0)
Monocytes Relative: 5 %
NEUTROS ABS: 12.6 10*3/uL — AB (ref 1.7–7.7)
NEUTROS PCT: 87 %
NRBC: 0 % (ref 0.0–0.2)
Platelets: 435 10*3/uL — ABNORMAL HIGH (ref 150–400)
RBC: 5.1 MIL/uL (ref 4.22–5.81)
RDW: 14.3 % (ref 11.5–15.5)
WBC: 14.6 10*3/uL — ABNORMAL HIGH (ref 4.0–10.5)

## 2018-05-16 LAB — CMP (CANCER CENTER ONLY)
ALT: 33 U/L (ref 0–44)
AST: 19 U/L (ref 15–41)
Albumin: 4.1 g/dL (ref 3.5–5.0)
Alkaline Phosphatase: 111 U/L (ref 38–126)
Anion gap: 10 (ref 5–15)
BUN: 12 mg/dL (ref 6–20)
CHLORIDE: 101 mmol/L (ref 98–111)
CO2: 28 mmol/L (ref 22–32)
CREATININE: 1.03 mg/dL (ref 0.61–1.24)
Calcium: 10.1 mg/dL (ref 8.9–10.3)
GFR, Estimated: >60 mL/min (ref >60)
Glucose, Bld: 82 mg/dL (ref 70–99)
Potassium: 4.3 mmol/L (ref 3.5–5.1)
SODIUM: 139 mmol/L (ref 135–145)
Total Bilirubin: 0.5 mg/dL (ref 0.3–1.2)
Total Protein: 9.6 g/dL — ABNORMAL HIGH (ref 6.5–8.1)

## 2018-05-16 LAB — SEDIMENTATION RATE: SED RATE: 24 mm/h — AB (ref 0–16)

## 2018-05-16 MED ORDER — PREDNISONE 20 MG PO TABS: 60.0000 mg | ORAL_TABLET | Freq: Every day | ORAL | 0 refills | Status: DC

## 2018-05-16 NOTE — Progress Notes (Signed)
HEMATOLOGY/ONCOLOGY CONSULTATION NOTE  Date of Service: 05/16/2018  Patient Care Team: System, Pcp Not In as PCP - General  CHIEF COMPLAINTS/PURPOSE OF CONSULTATION:  Nodular sclerosis Hodgkin's Lymphoma   HISTORY OF PRESENTING ILLNESS:   David Lam is a wonderful 22 y.o. male who has been referred to [place] by Dr. 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 He is accompanied today by his mother and sister. The pt reports that he is doing well overall.   The pt presented to the ED on 05/07/18 after developing chest pain over 2 days, with positional elements. The pt was evaluated with a CXR which revealed a mediastinal mass that was biopsied on 05/10/18 and confirmed nodular sclerosis Hodgkin's lymphoma, as noted below.   The pt notes that he though his chest discomfort was due to working out, and denies noticing any swelling in the area. The pt denies any recent fevers, chills, night sweats, skin rashes, unexpected weight loss. The pt notes that his chest pain continues and is making his sleeping difficult. He denies any difficulty ambulating or SOB.   The pt reports that he has not had any prior medical problems, and has had left thumb and left ankle surgeries from injuries. 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   Of note prior to the patient's visit today, pt has had a [place] lymph node biopsy completed on 05/10/18 with results revealing Nodular sclerosis Hodgkin's Lymphoma    The pt also had a CTA Chest on 05/07/18 which revealed Bulky, bilateral anterior superior mediastinal confluent soft tissue masses which are not calcified in appearance and which do not appear to cause occlusion or significant luminal narrowing of traversing vasculature. Leading consideration is lymphoma, possibly Hodgkin's. Given lack of differentiating soft tissue densities, a teratoma is believed less likely. Nonseminomatous germ cell tumor given presence of  small effusions might also be within differential though no pulmonary lesions are visualized. Metastatic disease, infection or inflammatory process are believed less likely.  The pt also had a CT A/P on 05/08/18 which revealed No acute finding or evidence of malignancy in the abdomen.  Most recent lab results (05/09/18) of CBC is as follows: all values are WNL except for WBC at 10.6k, HGB at 11.9, HCT at 34.7, MCV at 78.5, ANC at 7.9k, Eosinophils abs at 600. 05/08/18 Sed Rate at 19  On review of systems, pt reports chest pain, moving his bowels well, and denies skin rashes, fevers, chills, night sweats, unexpected weight loss, SOB, headaches, changes in vision, urinary pain or discomfort, abdominal pains, testicular pain or swelling, leg swelling, arm swelling, and any other symptoms.   On PMHx the pt reports left thumb and left ankle surgeries, denies blood transfusions. On Social Hx the pt denies alcohol consumption and denies smoking cigarettes or other recreational drugs. 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 Denies tattoos.  On Family Hx the pt denies any blood disorders or cancers.  He denies any medications.    MEDICAL HISTORY:  History reviewed. No pertinent past medical history.  SURGICAL HISTORY: Past Surgical History:  Procedure Laterality Date  . ANKLE SURGERY Left    cleaned up   . FRACTURE SURGERY Left    arm  . MEDIASTINOTOMY CHAMBERLAIN MCNEIL N/A 05/10/2018   Procedure: Mediastinal mass biopsy ;  Surgeon: Grace Isaac, MD;  Location: [facility];  Service: Thoracic;  Laterality: N/A;  . ORIF FINGER / THUMB FRACTURE Left  SOCIAL HISTORY: Social History   Socioeconomic History  . Marital status: Unknown    Spouse name: Not on file  . Number of children: Not on file  . Years of education: Not on file  . Highest education level: Not on file  Occupational History  . Not on file  Social Needs  . Financial resource strain: Not on file  .  Food insecurity:    Worry: Not on file    Inability: Not on file  . Transportation needs:    Medical: Not on file    Non-medical: Not on file  Tobacco Use  . Smoking status: Never Smoker  . Smokeless tobacco: Never Used  Substance and Sexual Activity  . Alcohol use: Never    Frequency: Never  . Drug use: Never  . Sexual activity: Not on file  Lifestyle  . Physical activity:    Days per week: Not on file    Minutes per session: Not on file  . Stress: Not on file  Relationships  . Social connections:    Talks on phone: Not on file    Gets together: Not on file    Attends religious service: Not on file    Active member of club or organization: Not on file    Attends meetings of clubs or organizations: Not on file    Relationship status: Not on file  . Intimate partner violence:    Fear of current or ex partner: Not on file    Emotionally abused: Not on file    Physically abused: Not on file    Forced sexual activity: Not on file  Other Topics Concern  . Not on file  Social History Narrative  . Not on file    FAMILY HISTORY: History reviewed. No pertinent family history.  ALLERGIES:  has No Known Allergies.  MEDICATIONS:  Current Outpatient Medications  Medication Sig Dispense Refill  . diphenhydrAMINE (BENADRYL) 25 mg capsule Take 25 mg by mouth as needed.    . traMADol (ULTRAM) 50 MG tablet Take 1 tablet (50 mg total) by mouth every 6 (six) hours as needed. 10 tablet 0   No current facility-administered medications for this visit.     REVIEW OF SYSTEMS:    10 Point review of Systems was done is negative except as noted above.  PHYSICAL EXAMINATION: ECOG PERFORMANCE STATUS: 1 - Symptomatic but completely ambulatory  . Vitals:   05/16/18 1106  BP: 134/80  Pulse: (!) 106  Resp: 18  Temp: 97.7 F (36.5 C)  SpO2: 99%   Filed Weights   05/16/18 1106  Weight: 177 lb 14.4 oz (80.7 kg)   .Body mass index is 26.66 kg/m.  GENERAL:alert, in no acute  distress and comfortable SKIN: no acute rashes, no significant lesions EYES: conjunctiva are pink and non-injected, sclera anicteric OROPHARYNX: MMM, no exudates, no oropharyngeal erythema or ulceration NECK: supple, no JVD LYMPH:  no palpable lymphadenopathy in the cervical, axillary or inguinal regions LUNGS: clear to auscultation b/l with normal respiratory effort HEART: regular rate & rhythm ABDOMEN:  normoactive bowel sounds , non tender, not distended. Extremity: no pedal edema PSYCH: alert & oriented x 3 with fluent speech NEURO: no focal motor/sensory deficits  LABORATORY DATA:  I have reviewed the data as listed  . CBC Latest Ref Rng & Units 05/09/2018 05/08/2018 05/07/2018  WBC 4.0 - 10.5 K/uL 10.6(H) 11.9(H) 15.5(H)  Hemoglobin 13.0 - 17.0 g/dL 11.9(L) 12.6(L) 14.2  Hematocrit 39.0 - 52.0 % 34.7(L) 37.6(L) 42.3  Platelets 150 - 400 K/uL 282 322 366    . CMP Latest Ref Rng & Units 05/09/2018 05/08/2018 05/07/2018  Glucose 70 - 99 mg/dL 93 145(H) 82  BUN 6 - 20 mg/dL '12 11 11  ' Creatinine 0.61 - 1.24 mg/dL 0.89 1.08 1.07  Sodium 135 - 145 mmol/L 135 137 137  Potassium 3.5 - 5.1 mmol/L 3.5 3.4(L) 3.9  Chloride 98 - 111 mmol/L 105 105 103  CO2 22 - 32 mmol/L '24 26 25  ' Calcium 8.9 - 10.3 mg/dL 8.9 9.2 9.5  Total Protein 6.5 - 8.1 g/dL - 7.7 -  Total Bilirubin 0.3 - 1.2 mg/dL - 1.0 -  Alkaline Phos 38 - 126 U/L - 76 -  AST 15 - 41 U/L - 14(L) -  ALT 0 - 44 U/L - 13 -   05/10/18 Biopsy:    RADIOGRAPHIC STUDIES: I have personally reviewed the radiological images as listed and agreed with the findings in the report. Dg Chest 2 View  Result Date: 05/07/2018 CLINICAL DATA:  Mid to LEFT side chest pain for 2 days, painful to take a deep breath EXAM: CHEST - 2 VIEW COMPARISON:  None FINDINGS: Normal heart size. Pulmonary vascularity normal. Abnormal soft tissue density at the superior mediastinum, AP window, RIGHT paratracheal, and RIGHT hilum highly suggestive at  adenopathy. Lungs clear. No infiltrate, pleural effusion or pneumothorax. Spina bifida occulta of C7. IMPRESSION: Suspected mediastinal and RIGHT hilar adenopathy; this can be seen with multiple etiologies including infection, other inflammatory processes, lymphoproliferative disorders, and malignancy. CT chest with contrast recommended for further evaluation. Electronically Signed   By: Lavonia Dana M.D.   On: 05/07/2018 18:00   Ct Angio Chest Pe W/cm &/or Wo Cm  Result Date: 05/07/2018 CLINICAL DATA:  Abnormal chest radiograph showing an enlarged mediastinum. Chest pain for 2 days. Afebrile. EXAM: CT ANGIOGRAPHY CHEST WITH CONTRAST TECHNIQUE: Multidetector CT imaging of the chest was performed using the standard protocol during bolus administration of intravenous contrast. Multiplanar CT image reconstructions and MIPs were obtained to evaluate the vascular anatomy. CONTRAST:  146m ISOVUE-370 IOPAMIDOL (ISOVUE-370) INJECTION 76% COMPARISON:  Chest radiograph from same day. FINDINGS: Cardiovascular: Included heart size is top normal with small pericardial effusion measuring up 1 cm in thickness. No thoracic aortic aneurysm or dissection. No acute pulmonary embolus. Patent great vessels with conventional branch pattern. Mediastinum/Nodes: Confluent appearing noncalcified soft tissue masses are noted within the superior mediastinum bilaterally. On the right, the largest conglomeration measures approximately a 9.9 x 7.1 x 5.6 cm and on the left, 5.9 x 4.1 x 4 cm. Smaller paratracheal soft tissue masses are noted. Nonpathologic size hilar lymph nodes are identified the largest on the right approximately 1.1 cm short axis. Lungs/Pleura: Scattered ground-glass opacities likely representing areas of alveolitis/pneumonitis or hypoventilatory change are identified. No dominant mass effusion or pneumothorax. Trace pleural effusions bilaterally. Upper Abdomen: No splenomegaly. The unenhanced liver, included adrenal glands  and kidneys are unremarkable. Musculoskeletal: No acute nor aggressive osseous abnormality. No axillary lymphadenopathy. Review of the MIP images confirms the above findings. IMPRESSION: Bulky, bilateral anterior superior mediastinal confluent soft tissue masses which are not calcified in appearance and which do not appear to cause occlusion or significant luminal narrowing of traversing vasculature. Leading consideration is lymphoma, possibly Hodgkin's. Given lack of differentiating soft tissue densities, a teratoma is believed less likely. Nonseminomatous germ cell tumor given presence of small effusions might also be within differential though no pulmonary lesions are visualized. Metastatic disease, infection or inflammatory  process are believed less likely. Electronically Signed   By: Ashley Royalty M.D.   On: 05/07/2018 21:45   Ct Abdomen Pelvis W Contrast  Result Date: 05/08/2018 CLINICAL DATA:  Unintended weight loss. Mediastinal mass on recent chest CT. Epigastric pain EXAM: CT ABDOMEN AND PELVIS WITH CONTRAST TECHNIQUE: Multidetector CT imaging of the abdomen and pelvis was performed using the standard protocol following bolus administration of intravenous contrast. CONTRAST:  113m OMNIPAQUE IOHEXOL 300 MG/ML  SOLN COMPARISON:  Chest CT from yesterday FINDINGS: Lower chest:  Trace left pleural effusion Hepatobiliary: No focal liver abnormality.No evidence of biliary obstruction or stone. Pancreas: Unremarkable. Spleen: Unremarkable. Adrenals/Urinary Tract: Negative adrenals. No hydronephrosis or stone. 14 mm simple appearing right renal cyst. Unremarkable bladder. Stomach/Bowel: No obstruction. No appendicitis. Formed stool throughout the colon Vascular/Lymphatic: No acute vascular abnormality. No mass or adenopathy. Reproductive:No pathologic findings. Other: No ascites or pneumoperitoneum. Musculoskeletal: No evident bone lesion. Mild disc bulge and retrolisthesis at L5-S1. IMPRESSION: No acute finding  or evidence of malignancy in the abdomen. Electronically Signed   By: JMonte FantasiaM.D.   On: 05/08/2018 13:45    ASSESSMENT & PLAN:   22y.o. male with  1. Nodular sclerosis Hodgkin's Lymphoma PLAN -Discussed patient's most recent labs from 05/09/18, [place] 7.9[place], HGB at 11.9, MCV at 78.5. The 05/08/18 Sedimentation rate was slightly elevated at 19 -Discussed the 05/10/18 Biopsy which revealed Nodular sclerosis Hodgkin's Lymphoma -Discussed the 05/07/18 CTA Chest which revealed Bulky, bilateral anterior superior mediastinal confluent soft tissue masses which are not calcified in appearance and which do not appear to cause occlusion or significant luminal narrowing of traversing vasculature. Leading consideration is lymphoma, possibly Hodgkin's. Given lack of differentiating soft tissue densities, a teratoma is believed less likely. Nonseminomatous germ cell tumor given presence of small effusions might also be within differential though no pulmonary lesions are visualized. Metastatic disease, infection or inflammatory process are believed less likely. -Discussed the 05/08/18 CT A/P which revealed No acute finding or evidence of malignancy in the abdomen.  -05/08/18 ECHO revealed LV EF of 55-60% -Discussed the diagnosis, natural history, curative intent of treatment, and impending work up.  -Discussed the risk of infertility that can result from treatment, and the recommendation for sperm banking. Provided contact for [facility]. -Recommended that the pt continue to eat well, drink at least 48-64 oz of water each day, and walk 20-30 minutes each day.   -Bulky disease, uncertain stage so far -Will order PET/CT -Will order pulmonary function -Will refer pt to IR for BM Bx -Will refer pt to IR for port placement -Will set pt up for chemotherapy counseling  -Will order labs today -Will order 69mPrednisone each day for one week -Will see the pt back in one week     Labs  today PET/CT stat in 3-4 days CT bone marrow biopsy in 3-4 days Port a cath in 3-4 days Pulmonary function tests in 3 days [facility] referral for sperm banking RTC with Dr KaIrene Limbon 7 days with above    All of the patients questions were answered with apparent satisfaction. The patient knows to call the clinic with any problems, questions or concerns.  The total time spent in the appt was 80 minutes and more than 50% was on counseling and direct patient cares.    GaSullivan LoneD MSShellyAHIVMS [facility] RockTKindred Hospital - White Rockematology/Oncology Physician CoMarshfield Medical Ctr [place](Office):       [phone] cell):  [phone]):  [phone]  05/16/2018 12:51 PM  I, Baldwin [place], am acting as a scribe for Dr. Irene Limbo  .I have reviewed the above documentation for accuracy and completeness, and I agree with the above. [name] MD

## 2018-05-16 NOTE — Telephone Encounter (Signed)
Verified by RN Aldona Bar) that [place] gave a verbal referral to patient for fertility purposes. RN provided number. Share with me by Aldona Bar RN

## 2018-05-17 ENCOUNTER — Telehealth: Payer: Self-pay

## 2018-05-17 ENCOUNTER — Other Ambulatory Visit: Payer: Self-pay | Admitting: Student

## 2018-05-17 ENCOUNTER — Other Ambulatory Visit: Payer: Self-pay

## 2018-05-17 DIAGNOSIS — C8118 Nodular sclerosis classical Hodgkin lymphoma, lymph nodes of multiple sites: Secondary | ICD-10-CM

## 2018-05-17 LAB — HEPATITIS B CORE ANTIBODY, TOTAL: Hep B Core Total Ab: NEGATIVE

## 2018-05-17 LAB — HIV ANTIBODY (ROUTINE TESTING W REFLEX): HIV SCREEN 4TH GENERATION: NONREACTIVE

## 2018-05-17 LAB — HEPATITIS C ANTIBODY: HCV Ab: <0.1 s/co ratio (ref 0.0–0.9)

## 2018-05-17 LAB — HEPATITIS B SURFACE ANTIGEN: Hepatitis B Surface Ag: NEGATIVE

## 2018-05-17 NOTE — Telephone Encounter (Signed)
Called pt and notified of the following appts:  Monday (05/20/18) - Port Placement -Location [facility] -Arrival time 10am -NPO after midnight -Needs driver  Tuesday (32/20/25) - PET  -Location [facility] Arrival time 12pm -NPO 6 hours prior  Wednesday (05/22/18) - [place] -Location [facility] -Arrival time 6:30am -NPO after midnight -Needs driver  Thursday (42/70/62) - [place] -Location [facility] -Arrival time 10:30am  Additionally called pt sister, Janett Billow, to share all information regarding appts as well. Voiced that pt may need help with transportation on Monday or to have the port placement rescheduled. Ok for port to be pushed out to the following week if necessary. Number for Central Scheduling given [phone]. Additionally called [facility] and spoke with Manuela Schwartz regarding the potential to have some of the appts on the same day per pt request. Explained that this would be unlikely, pt insisted that this RN ask. Per Manuela Schwartz and Dr. Irene Limbo upon discussion procedures cannot be performed on the same day d/t their nature.

## 2018-05-20 ENCOUNTER — Inpatient Hospital Stay (HOSPITAL_COMMUNITY): Admission: RE | Admit: 2018-05-20 | Payer: BLUE CROSS/BLUE SHIELD | Source: Ambulatory Visit

## 2018-05-20 ENCOUNTER — Ambulatory Visit (HOSPITAL_COMMUNITY): Payer: BLUE CROSS/BLUE SHIELD

## 2018-05-20 ENCOUNTER — Other Ambulatory Visit: Payer: Self-pay | Admitting: Hematology

## 2018-05-20 ENCOUNTER — Other Ambulatory Visit: Payer: Self-pay | Admitting: Radiology

## 2018-05-20 DIAGNOSIS — C8118 Nodular sclerosis classical Hodgkin lymphoma, lymph nodes of multiple sites: Secondary | ICD-10-CM

## 2018-05-21 ENCOUNTER — Other Ambulatory Visit: Payer: Self-pay | Admitting: Hematology

## 2018-05-21 ENCOUNTER — Ambulatory Visit
Admission: RE | Admit: 2018-05-21 | Discharge: 2018-05-21 | Disposition: A | Discharge status: Home / Self Care | Payer: BLUE CROSS/BLUE SHIELD | Source: Ambulatory Visit | Attending: Hematology | Admitting: Hematology

## 2018-05-21 DIAGNOSIS — C8118 Nodular sclerosis classical Hodgkin lymphoma, lymph nodes of multiple sites: Secondary | ICD-10-CM | POA: Diagnosis not present

## 2018-05-21 DIAGNOSIS — R590 Localized enlarged lymph nodes: Secondary | ICD-10-CM | POA: Insufficient documentation

## 2018-05-21 LAB — GLUCOSE, CAPILLARY: GLUCOSE-CAPILLARY: 110 mg/dL — AB (ref 70–99)

## 2018-05-21 MED ORDER — FLUDEOXYGLUCOSE F - 18 (FDG) INJECTION
9.2000 | Freq: Once | INTRAVENOUS | Status: AC | PRN
  Administered 2018-05-21: 8.82 via INTRAVENOUS

## 2018-05-22 ENCOUNTER — Ambulatory Visit (HOSPITAL_COMMUNITY)
Admission: RE | Admit: 2018-05-22 | Discharge: 2018-05-22 | Disposition: A | Discharge status: Home / Self Care | Payer: BLUE CROSS/BLUE SHIELD | Source: Ambulatory Visit | Attending: Hematology | Admitting: Hematology

## 2018-05-22 ENCOUNTER — Encounter (HOSPITAL_COMMUNITY): Payer: Self-pay

## 2018-05-22 DIAGNOSIS — Z79899 Other long term (current) drug therapy: Secondary | ICD-10-CM | POA: Diagnosis not present

## 2018-05-22 DIAGNOSIS — R7989 Other specified abnormal findings of blood chemistry: Secondary | ICD-10-CM | POA: Insufficient documentation

## 2018-05-22 DIAGNOSIS — C8118 Nodular sclerosis classical Hodgkin lymphoma, lymph nodes of multiple sites: Secondary | ICD-10-CM

## 2018-05-22 DIAGNOSIS — D72829 Elevated white blood cell count, unspecified: Secondary | ICD-10-CM | POA: Diagnosis not present

## 2018-05-22 LAB — CBC WITH DIFFERENTIAL/PLATELET
ABS IMMATURE GRANULOCYTES: 0.03 10*3/uL (ref 0.00–0.07)
BASOS ABS: 0 10*3/uL (ref 0.0–0.1)
Basophils Relative: 0 %
Eosinophils Absolute: 0 10*3/uL (ref 0.0–0.5)
Eosinophils Relative: 0 %
HEMATOCRIT: 38.5 % — AB (ref 39.0–52.0)
HEMOGLOBIN: 12.9 g/dL — AB (ref 13.0–17.0)
Immature Granulocytes: 0 %
LYMPHS ABS: 1.6 10*3/uL (ref 0.7–4.0)
LYMPHS PCT: 15 %
MCH: 26.5 pg (ref 26.0–34.0)
MCHC: 33.5 g/dL (ref 30.0–36.0)
MCV: 79.2 fL — ABNORMAL LOW (ref 80.0–100.0)
Monocytes Absolute: 0.9 10*3/uL (ref 0.1–1.0)
Monocytes Relative: 8 %
NEUTROS ABS: 8.4 10*3/uL — AB (ref 1.7–7.7)
NRBC: 0 % (ref 0.0–0.2)
Neutrophils Relative %: 77 %
Platelets: 449 10*3/uL — ABNORMAL HIGH (ref 150–400)
RBC: 4.86 MIL/uL (ref 4.22–5.81)
RDW: 14.7 % (ref 11.5–15.5)
WBC: 11 10*3/uL — AB (ref 4.0–10.5)

## 2018-05-22 LAB — PROTIME-INR
INR: 1.11
Prothrombin Time: 14.2 seconds (ref 11.4–15.2)

## 2018-05-22 LAB — APTT: aPTT: 29 seconds (ref 24–36)

## 2018-05-22 MED ORDER — MIDAZOLAM HCL 2 MG/2ML IJ SOLN
INTRAMUSCULAR | Status: AC | PRN
  Administered 2018-05-22 (×3): 1 mg via INTRAVENOUS

## 2018-05-22 MED ORDER — FENTANYL CITRATE (PF) 100 MCG/2ML IJ SOLN
INTRAMUSCULAR | Status: AC
  Filled 2018-05-22: qty 2

## 2018-05-22 MED ORDER — MIDAZOLAM HCL 2 MG/2ML IJ SOLN
INTRAMUSCULAR | Status: AC
  Filled 2018-05-22: qty 4

## 2018-05-22 MED ORDER — FENTANYL CITRATE (PF) 100 MCG/2ML IJ SOLN
INTRAMUSCULAR | Status: AC | PRN
  Administered 2018-05-22 (×2): 50 ug via INTRAVENOUS

## 2018-05-22 MED ORDER — SODIUM CHLORIDE 0.9 % IV SOLN
INTRAVENOUS | Status: DC
  Administered 2018-05-22: 08:00:00 via INTRAVENOUS

## 2018-05-22 MED ORDER — LIDOCAINE HCL (PF) 1 % IJ SOLN
INTRAMUSCULAR | Status: AC | PRN
  Administered 2018-05-22: 10 mL

## 2018-05-22 NOTE — Consult Note (Signed)
Chief Complaint: Patient was seen in consultation today for CT guided bone marrow biopsy  Referring Physician(s): [name]  Supervising Physician: Corrie Mckusick  Patient Status: [facility] - Out-pt  History of Present Illness: David Lam is a 22 y.o. male with history of recently diagnosed Hodgkin's lymphoma who presents today for CT-guided bone marrow biopsy for staging purposes.  History reviewed. No pertinent past medical history.  Past Surgical History:  Procedure Laterality Date  . ANKLE SURGERY Left    cleaned up   . FRACTURE SURGERY Left    arm  . MEDIASTINOTOMY CHAMBERLAIN MCNEIL N/A 05/10/2018   Procedure: Mediastinal mass biopsy ;  Surgeon: Grace Isaac, MD;  Location: [facility] OR;  Service: Thoracic;  Laterality: N/A;  . ORIF FINGER / THUMB FRACTURE Left     Allergies: Patient has no known allergies.  Medications: Prior to Admission medications   Medication Sig Start Date End Date Taking? Authorizing Provider  diphenhydrAMINE (BENADRYL) 25 mg capsule Take 25 mg by mouth as needed.   Yes [provider]  predniSONE (DELTASONE) 20 MG tablet Take 3 tablets (60 mg total) by mouth daily with breakfast for 7 days. 05/16/18 05/23/18 Yes [name], MD  traMADol (ULTRAM) 50 MG tablet Take 1 tablet (50 mg total) by mouth every 6 (six) hours as needed. 05/10/18  Yes Elgie Collard, PA-C     History reviewed. No pertinent family history.  Social History   Socioeconomic History  . Marital status: Unknown    Spouse name: Not on file  . Number of children: Not on file  . Years of education: Not on file  . Highest education level: Not on file  Occupational History  . Not on file  Social Needs  . Financial resource strain: Not on file  . Food insecurity:    Worry: Not on file    Inability: Not on file  . Transportation needs:    Medical: Not on file    Non-medical: Not on file  Tobacco Use  . Smoking status: Never Smoker  .  Smokeless tobacco: Never Used  Substance and Sexual Activity  . Alcohol use: Never    Frequency: Never  . Drug use: Never  . Sexual activity: Not on file  Lifestyle  . Physical activity:    Days per week: Not on file    Minutes per session: Not on file  . Stress: Not on file  Relationships  . Social connections:    Talks on phone: Not on file    Gets together: Not on file    Attends religious service: Not on file    Active member of club or organization: Not on file    Attends meetings of clubs or organizations: Not on file    Relationship status: Not on file  Other Topics Concern  . Not on file  Social History Narrative  . Not on file      Review of Systems denies fever, headache, chest pain, dyspnea, cough, abdominal/back pain, nausea, vomiting or bleeding.  Vital Signs: BP (!) 149/90 (BP Location: Right Arm)   Pulse 84   Temp 98.1 F (36.7 C) (Oral)   Resp 18   Ht 5' 8.5" (1.74 m)   Wt 177 lb (80.3 kg)   SpO2 98%   BMI 26.52 kg/m   Physical Exam awake, alert.  Chest clear to auscultation bilaterally.  Heart with regular rate and rhythm.  Abdomen soft, positive bowel sounds, nontender.  No lower extremity edema.  Imaging: Dg Chest 2 View  Result Date: 05/07/2018 CLINICAL DATA:  Mid to LEFT side chest pain for 2 days, painful to take a deep breath EXAM: CHEST - 2 VIEW COMPARISON:  None FINDINGS: Normal heart size. Pulmonary vascularity normal. Abnormal soft tissue density at the superior mediastinum, AP window, RIGHT paratracheal, and RIGHT hilum highly suggestive at adenopathy. Lungs clear. No infiltrate, pleural effusion or pneumothorax. Spina bifida occulta of C7. IMPRESSION: Suspected mediastinal and RIGHT hilar adenopathy; this can be seen with multiple etiologies including infection, other inflammatory processes, lymphoproliferative disorders, and malignancy. CT chest with contrast recommended for further evaluation. Electronically Signed   By: Lavonia Dana M.D.    On: 05/07/2018 18:00   Ct Angio Chest Pe W/cm &/or Wo Cm  Result Date: 05/07/2018 CLINICAL DATA:  Abnormal chest radiograph showing an enlarged mediastinum. Chest pain for 2 days. Afebrile. EXAM: CT ANGIOGRAPHY CHEST WITH CONTRAST TECHNIQUE: Multidetector CT imaging of the chest was performed using the standard protocol during bolus administration of intravenous contrast. Multiplanar CT image reconstructions and MIPs were obtained to evaluate the vascular anatomy. CONTRAST:  131m ISOVUE-370 IOPAMIDOL (ISOVUE-370) INJECTION 76% COMPARISON:  Chest radiograph from same day. FINDINGS: Cardiovascular: Included heart size is top normal with small pericardial effusion measuring up 1 cm in thickness. No thoracic aortic aneurysm or dissection. No acute pulmonary embolus. Patent great vessels with conventional branch pattern. Mediastinum/Nodes: Confluent appearing noncalcified soft tissue masses are noted within the superior mediastinum bilaterally. On the right, the largest conglomeration measures approximately a 9.9 x 7.1 x 5.6 cm and on the left, 5.9 x 4.1 x 4 cm. Smaller paratracheal soft tissue masses are noted. Nonpathologic size hilar lymph nodes are identified the largest on the right approximately 1.1 cm short axis. Lungs/Pleura: Scattered ground-glass opacities likely representing areas of alveolitis/pneumonitis or hypoventilatory change are identified. No dominant mass effusion or pneumothorax. Trace pleural effusions bilaterally. Upper Abdomen: No splenomegaly. The unenhanced liver, included adrenal glands and kidneys are unremarkable. Musculoskeletal: No acute nor aggressive osseous abnormality. No axillary lymphadenopathy. Review of the MIP images confirms the above findings. IMPRESSION: Bulky, bilateral anterior superior mediastinal confluent soft tissue masses which are not calcified in appearance and which do not appear to cause occlusion or significant luminal narrowing of traversing vasculature.  Leading consideration is lymphoma, possibly Hodgkin's. Given lack of differentiating soft tissue densities, a teratoma is believed less likely. Nonseminomatous germ cell tumor given presence of small effusions might also be within differential though no pulmonary lesions are visualized. Metastatic disease, infection or inflammatory process are believed less likely. Electronically Signed   By: DAshley RoyaltyM.D.   On: 05/07/2018 21:45   Ct Abdomen Pelvis W Contrast  Result Date: 05/08/2018 CLINICAL DATA:  Unintended weight loss. Mediastinal mass on recent chest CT. Epigastric pain EXAM: CT ABDOMEN AND PELVIS WITH CONTRAST TECHNIQUE: Multidetector CT imaging of the abdomen and pelvis was performed using the standard protocol following bolus administration of intravenous contrast. CONTRAST:  1063mOMNIPAQUE IOHEXOL 300 MG/ML  SOLN COMPARISON:  Chest CT from yesterday FINDINGS: Lower chest:  Trace left pleural effusion Hepatobiliary: No focal liver abnormality.No evidence of biliary obstruction or stone. Pancreas: Unremarkable. Spleen: Unremarkable. Adrenals/Urinary Tract: Negative adrenals. No hydronephrosis or stone. 14 mm simple appearing right renal cyst. Unremarkable bladder. Stomach/Bowel: No obstruction. No appendicitis. Formed stool throughout the colon Vascular/Lymphatic: No acute vascular abnormality. No mass or adenopathy. Reproductive:No pathologic findings. Other: No ascites or pneumoperitoneum. Musculoskeletal: No evident bone lesion. Mild disc bulge and retrolisthesis at L5-S1.  IMPRESSION: No acute finding or evidence of malignancy in the abdomen. Electronically Signed   By: Monte Fantasia M.D.   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 EXAM: NUCLEAR MEDICINE PET SKULL BASE TO THIGH TECHNIQUE: 8.82 mCi F-18 FDG was injected intravenously. Full-ring PET imaging  was performed from the skull base to thigh after the radiotracer. CT data was obtained and used for attenuation correction and anatomic localization. Fasting blood glucose: 110 mg/dl COMPARISON:  05/08/2018 FINDINGS: Mediastinal blood pool activity: SUV max 1.99 Liver activity: SUV max 2.46. NECK: No hypermetabolic lymph nodes in the neck. Incidental CT findings: none CHEST: No hypermetabolic axillary or supraclavicular lymph nodes. Bulky, predominantly anterior mediastinal hypermetabolic adenopathy is identified. -Left-sided pre-vascular lymph node measures 3.6 x 4.2 cm with SUV max of 4.4. -right paratracheal lymph node measures 1.6 cm with  SUV max of 7.7. -right pre-vascular lymph node measures 6.3 x 4.1 cm and has an SUV max of 6.82. -low left paratracheal lymph node measures 1 cm and has an SUV max of 3.75. No hypermetabolic hilar lymph nodes. Pericardial effusion is again noted, unchanged. Incidental CT findings: No pleural effusion. No airspace consolidation. ABDOMEN/PELVIS: No abnormal uptake identified within the liver, pancreas, or spleen. No abnormal uptake within the adrenal glands. No hypermetabolic lymph nodes within the abdomen or pelvis. No hypermetabolic inguinal lymph nodes. The spleen appears normal in size without abnormal uptake. Incidental CT findings: none SKELETON: No focal hypermetabolic activity to suggest skeletal metastasis. Incidental CT findings: none IMPRESSION: 1. Hypermetabolic mediastinal adenopathy is identified compatible with history of lymphoma. No hypermetabolic lymph nodes within the neck, abdomen or pelvis. Electronically Signed   By: Kerby Moors M.D.   On: 05/21/2018 15:25    Labs:  CBC: Recent Labs    05/08/18 0226 05/09/18 0256 05/16/18 1302 05/22/18 0740  WBC 11.9* 10.6* 14.6* 11.0*  HGB 12.6* 11.9* 13.7 12.9*  HCT 37.6* 34.7* 40.7 38.5*  PLT 322 282 435* 449*    COAGS: Recent Labs    05/10/18 1153 05/22/18 0740  INR 1.23 1.11  APTT 32 29     BMP: Recent Labs    05/07/18 1723 05/08/18 0226 05/09/18 0256 05/16/18 1302  NA 137 137 135 139  K 3.9 3.4* 3.5 4.3  CL 103 105 105 101  CO2 '25 26 24 28  ' GLUCOSE 82 145* 93 82  BUN '11 11 12 12  ' CALCIUM 9.5 9.2 8.9 10.1  CREATININE 1.07 1.08 0.89 1.03  GFRNONAA >60 >60 >60 >60  GFRAA >60 >60 >60 >60    LIVER FUNCTION TESTS: Recent Labs    05/08/18 0226 05/16/18 1302  BILITOT 1.0 0.5  AST 14* 19  ALT 13 33  ALKPHOS 76 111  PROT 7.7 9.6*  ALBUMIN 3.5 4.1    TUMOR MARKERS: No results for input(s): AFPTM, CEA, CA199, CHROMGRNA in the last 8760 hours.  Assessment and Plan: 22 y.o. male with history of recently diagnosed Hodgkin's lymphoma who presents today for CT-guided bone marrow biopsy for staging purposes.Risks and benefits discussed with the patient/sister including, but not limited to bleeding, infection, damage to adjacent structures or low yield requiring additional tests.  All of the patient's questions were answered, patient is agreeable to proceed. Consent signed and in chart.     Thank you for this interesting consult.  I greatly enjoyed meeting Colsen Modi and look  forward to participating in their care.  A copy of this report was sent to the requesting provider on this date.  Electronically Signed: D. Rowe Robert, PA-C 05/22/2018, 8:36 AM   I spent a total of 20 minutes    in face to face in clinical consultation, greater than 50% of which was counseling/coordinating care for CT-guided bone marrow biopsy

## 2018-05-22 NOTE — Procedures (Signed)
Interventional Radiology Procedure Note  Procedure: CT guided aspirate and core biopsy of right posterior iliac bone Complications: None Recommendations: - Bedrest supine x 1 hrs - OTC's PRN  Pain - Follow biopsy results  Signed,  Jamaiya Tunnell S. Adalynn Corne, DO    

## 2018-05-22 NOTE — Discharge Instructions (Signed)
Bone Marrow Aspiration and Bone Marrow Biopsy, Adult, Care After °This sheet gives you information about how to care for yourself after your procedure. Your health care provider may also give you more specific instructions. If you have problems or questions, contact your health care provider. °What can I expect after the procedure? °After the procedure, it is common to have: °· Mild pain and tenderness. °· Swelling. °· Bruising. ° °Follow these instructions at home: °· Take over-the-counter or prescription medicines only as told by your health care provider. °· Do not take baths, swim, or use a hot tub until your health care provider approves. Ask if you can take a shower or have a sponge bath. °· Follow instructions from your health care provider about how to take care of the puncture site. Make sure you: °? Wash your hands with soap and water before you change your bandage (dressing). If soap and water are not available, use hand sanitizer. °? Change your dressing as told by your health care provider. °· Check your puncture site every day for signs of infection. Check for: °? More redness, swelling, or pain. °? More fluid or blood. °? Warmth. °? Pus or a bad smell. °· Return to your normal activities as told by your health care provider. Ask your health care provider what activities are safe for you. °· Do not drive for 24 hours if you were given a medicine to help you relax (sedative). °· Keep all follow-up visits as told by your health care provider. This is important. °Contact a health care provider if: °· You have more redness, swelling, or pain around the puncture site. °· You have more fluid or blood coming from the puncture site. °· Your puncture site feels warm to the touch. °· You have pus or a bad smell coming from the puncture site. °· You have a fever. °· Your pain is not controlled with medicine. °This information is not intended to replace advice given to you by your health care provider. Make sure  you discuss any questions you have with your health care provider. °Document Released: 01/27/2005 Document Revised: 01/28/2016 Document Reviewed: 12/22/2015 °Elsevier Interactive Patient Education © 2018 Elsevier Inc. ° °Moderate Conscious Sedation, Adult, Care After °These instructions provide you with information about caring for yourself after your procedure. Your health care provider may also give you more specific instructions. Your treatment has been planned according to current medical practices, but problems sometimes occur. Call your health care provider if you have any problems or questions after your procedure. °What can I expect after the procedure? °After your procedure, it is common: °· To feel sleepy for several hours. °· To feel clumsy and have poor balance for several hours. °· To have poor judgment for several hours. °· To vomit if you eat too soon. ° °Follow these instructions at home: °For at least 24 hours after the procedure: ° °· Do not: °? Participate in activities where you could fall or become injured. °? Drive. °? Use heavy machinery. °? Drink alcohol. °? Take sleeping pills or medicines that cause drowsiness. °? Make important decisions or sign legal documents. °? Take care of children on your own. °· Rest. °Eating and drinking °· Follow the diet recommended by your health care provider. °· If you vomit: °? Drink water, juice, or soup when you can drink without vomiting. °? Make sure you have little or no nausea before eating solid foods. °General instructions °· Have a responsible adult stay with you until you   are awake and alert.  Take over-the-counter and prescription medicines only as told by your health care provider.  If you smoke, do not smoke without supervision.  Keep all follow-up visits as told by your health care provider. This is important. Contact a health care provider if:  You keep feeling nauseous or you keep vomiting.  You feel light-headed.  You develop a  rash.  You have a fever. Get help right away if:  You have trouble breathing. This information is not intended to replace advice given to you by your health care provider. Make sure you discuss any questions you have with your health care provider. Document Released: 04/30/2013 Document Revised: 12/13/2015 Document Reviewed: 10/30/2015 Elsevier Interactive Patient Education  [employer].

## 2018-05-23 ENCOUNTER — Telehealth: Payer: Self-pay | Admitting: Hematology

## 2018-05-23 ENCOUNTER — Inpatient Hospital Stay (HOSPITAL_BASED_OUTPATIENT_CLINIC_OR_DEPARTMENT_OTHER): Payer: BLUE CROSS/BLUE SHIELD | Admitting: Hematology

## 2018-05-23 ENCOUNTER — Encounter: Payer: Self-pay | Admitting: Hematology

## 2018-05-23 VITALS — BP 135/72 | HR 77 | Temp 98.2°F | Resp 18 | Ht 68.0 in | Wt 183.4 lb

## 2018-05-23 DIAGNOSIS — C8118 Nodular sclerosis classical Hodgkin lymphoma, lymph nodes of multiple sites: Secondary | ICD-10-CM | POA: Diagnosis not present

## 2018-05-23 NOTE — Patient Instructions (Signed)
Thank you for choosing [facility] to provide your oncology and hematology care.  To afford each patient quality time with our providers, please arrive 30 minutes before your scheduled appointment time.  If you arrive late for your appointment, you may be asked to reschedule.  We strive to give you quality time with our providers, and arriving late affects you and other patients whose appointments are after yours.    If you are a no show for multiple scheduled visits, you may be dismissed from the clinic at the providers discretion.     Again, thank you for choosing [facility], our hope is that these requests will decrease the amount of time that you wait before being seen by our physicians.  ______________________________________________________________________   Should you have questions after your visit to the [facility], please contact our office at [phone] between the hours of 8:30 and 4:30 p.m.    Voicemails left after 4:30p.m will not be returned until the following business day.     For prescription refill requests, please have your pharmacy contact us directly.  Please also try to allow 48 hours for prescription requests.     Please contact the scheduling department for questions regarding scheduling.  For scheduling of procedures such as PET scans, CT scans, MRI, Ultrasound, etc please contact central scheduling at [phone].     Resources For Cancer Patients and Caregivers:    Oncolink.org:  A wonderful resource for patients and healthcare providers for information regarding your disease, ways to tract your treatment, what to expect, etc.      American Cancer Society:  800-227-2345  Can help patients locate various types of support and financial assistance   Cancer Care: 1-800-813-HOPE (4673) Provides financial assistance, online support groups, medication/co-pay assistance.     [place] DSS:  336-641-3447 Where to apply  for food stamps, Medicaid, and utility assistance   Medicare Rights Center: 800-333-4114 Helps people with Medicare understand their rights and benefits, navigate the Medicare system, and secure the quality healthcare they deserve   SCAT: 336-333-6589 [place] [employer]'s shared-ride transportation service for eligible riders who have a disability that prevents them from riding the fixed route bus.     For additional information on assistance programs please contact our social worker:   David Lam:  [phone]  

## 2018-05-23 NOTE — Progress Notes (Signed)
HEMATOLOGY/ONCOLOGY CONSULTATION NOTE  Date of Service: 05/23/2018  Patient Care Team: System, Pcp Not In as PCP - General  CHIEF COMPLAINTS/PURPOSE OF CONSULTATION:  Nodular sclerosis Hodgkin's Lymphoma   HISTORY OF PRESENTING ILLNESS:   David Lam is a wonderful 22 y.o. male who has been referred to [place] by Dr. 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 He is accompanied today by his mother and sister. The pt reports that he is doing well overall.   The pt presented to the ED on 05/07/18 after developing chest pain over 2 days, with positional elements. The pt was evaluated with a CXR which revealed a mediastinal mass that was biopsied on 05/10/18 and confirmed nodular sclerosis Hodgkin's lymphoma, as noted below.   The pt notes that he though his chest discomfort was due to working out, and denies noticing any swelling in the area. The pt denies any recent fevers, chills, night sweats, skin rashes, unexpected weight loss. The pt notes that his chest pain continues and is making his sleeping difficult. He denies any difficulty ambulating or SOB.   The pt reports that he has not had any prior medical problems, and has had left thumb and left ankle surgeries from injuries. 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   Of note prior to the patient's visit today, pt has had a Scalene lymph node biopsy completed on 05/10/18 with results revealing Nodular sclerosis Hodgkin's Lymphoma    The pt also had a CTA Chest on 05/07/18 which revealed Bulky, bilateral anterior superior mediastinal confluent soft tissue masses which are not calcified in appearance and which do not appear to cause occlusion or significant luminal narrowing of traversing vasculature. Leading consideration is lymphoma, possibly Hodgkin's. Given lack of differentiating soft tissue densities, a teratoma is believed less likely. Nonseminomatous germ cell tumor given presence of  small effusions might also be within differential though no pulmonary lesions are visualized. Metastatic disease, infection or inflammatory process are believed less likely.  The pt also had a CT A/P on 05/08/18 which revealed No acute finding or evidence of malignancy in the abdomen.  Most recent lab results (05/09/18) of CBC is as follows: all values are WNL except for WBC at 10.6k, HGB at 11.9, HCT at 34.7, MCV at 78.5, ANC at 7.9k, Eosinophils abs at 600. 05/08/18 Sed Rate at 19  On review of systems, pt reports chest pain, moving his bowels well, and denies skin rashes, fevers, chills, night sweats, unexpected weight loss, SOB, headaches, changes in vision, urinary pain or discomfort, abdominal pains, testicular pain or swelling, leg swelling, arm swelling, and any other symptoms.   On PMHx the pt reports left thumb and left ankle surgeries, denies blood transfusions. On Social Hx the pt denies alcohol consumption and denies smoking cigarettes or other recreational drugs. 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 Denies tattoos.  On Family Hx the pt denies any blood disorders or cancers.  He denies any medications.  Interval History:   [name] returns today for management and evaluation of his Hodgkin's lymphoma. The patient's last visit with [place] was on 05/16/18. The pt's sister is present in clinic visit via the phone. The pt reports that he is doing well overall.   The pt reports that the steroids have improved his previous of chest pain and tightness. He has continued to eat well, has intentionally tried to remain very hydrated, and has continued working out and remaining active.  The pt notes that he has not had any other concerns in the interim and has not developed any fevers, chills or night sweats.   Of note since the patient's last visit, pt has had a PET/CT completed on 05/21/18 with results revealing Hypermetabolic mediastinal adenopathy is identified compatible  with history of lymphoma. No hypermetabolic lymph nodes within the neck, abdomen or pelvis.  Lab results (05/22/18) of CBC w/diff is as follows: all values are WNL except for WBC at 11.0k, HGB at 12.9, HCT at 38.5, MCV at 79.2, PLT at 449k, ANC at 8.4k.  On review of systems, pt reports good energy levels, staying hydrated, eating well, staying active, and denies fevers, chills, night sweats, and any other symptoms.   MEDICAL HISTORY:  History reviewed. No pertinent past medical history.  SURGICAL HISTORY: Past Surgical History:  Procedure Laterality Date  . ANKLE SURGERY Left    cleaned up   . FRACTURE SURGERY Left    arm  . MEDIASTINOTOMY CHAMBERLAIN MCNEIL N/A 05/10/2018   Procedure: Mediastinal mass biopsy ;  Surgeon: Grace Isaac, MD;  Location: [facility] OR;  Service: Thoracic;  Laterality: N/A;  . ORIF FINGER / THUMB FRACTURE Left     SOCIAL HISTORY: Social History   Socioeconomic History  . Marital status: Unknown    Spouse name: Not on file  . Number of children: Not on file  . Years of education: Not on file  . Highest education level: Not on file  Occupational History  . Not on file  Social Needs  . Financial resource strain: Not on file  . Food insecurity:    Worry: Not on file    Inability: Not on file  . Transportation needs:    Medical: Not on file    Non-medical: Not on file  Tobacco Use  . Smoking status: Never Smoker  . Smokeless tobacco: Never Used  Substance and Sexual Activity  . Alcohol use: Never    Frequency: Never  . Drug use: Never  . Sexual activity: Not on file  Lifestyle  . Physical activity:    Days per week: Not on file    Minutes per session: Not on file  . Stress: Not on file  Relationships  . Social connections:    Talks on phone: Not on file    Gets together: Not on file    Attends religious service: Not on file    Active member of club or organization: Not on file    Attends meetings of clubs or organizations: Not on  file    Relationship status: Not on file  . Intimate partner violence:    Fear of current or ex partner: Not on file    Emotionally abused: Not on file    Physically abused: Not on file    Forced sexual activity: Not on file  Other Topics Concern  . Not on file  Social History Narrative  . Not on file    FAMILY HISTORY: History reviewed. No pertinent family history.  ALLERGIES:  has No Known Allergies.  MEDICATIONS:  Current Outpatient Medications  Medication Sig Dispense Refill  . diphenhydrAMINE (BENADRYL) 25 mg capsule Take 25 mg by mouth as needed.    . predniSONE (DELTASONE) 20 MG tablet Take 3 tablets (60 mg total) by mouth daily with breakfast for 7 days. 21 tablet 0  . traMADol (ULTRAM) 50 MG tablet Take 1 tablet (50 mg total) by mouth every 6 (six) hours as needed. 10 tablet 0  No current facility-administered medications for this visit.     REVIEW OF SYSTEMS:    A 10+ POINT REVIEW OF SYSTEMS WAS OBTAINED including neurology, dermatology, psychiatry, cardiac, respiratory, lymph, extremities, GI, GU, Musculoskeletal, constitutional, breasts, reproductive, HEENT.  All pertinent positives are noted in the HPI.  All others are negative.   PHYSICAL EXAMINATION: ECOG PERFORMANCE STATUS: 1 - Symptomatic but completely ambulatory  . Vitals:   05/23/18 1125  BP: 135/72  Pulse: 77  Resp: 18  Temp: 98.2 F (36.8 C)  SpO2: 100%   Filed Weights   05/23/18 1125  Weight: 183 lb 6.4 oz (83.2 kg)   .Body mass index is 27.89 kg/m.  GENERAL:alert, in no acute distress and comfortable SKIN: no acute rashes, no significant lesions EYES: conjunctiva are pink and non-injected, sclera anicteric OROPHARYNX: MMM, no exudates, no oropharyngeal erythema or ulceration NECK: supple, no JVD LYMPH:  no palpable lymphadenopathy in the cervical, axillary or inguinal regions LUNGS: clear to auscultation b/l with normal respiratory effort HEART: regular rate & rhythm ABDOMEN:   normoactive bowel sounds , non tender, not distended. No palpable hepatosplenomegaly.  Extremity: no pedal edema PSYCH: alert & oriented x 3 with fluent speech NEURO: no focal motor/sensory deficits   LABORATORY DATA:  I have reviewed the data as listed  . CBC Latest Ref Rng & Units 05/22/2018 05/16/2018 05/09/2018  WBC 4.0 - 10.5 K/uL 11.0(H) 14.6(H) 10.6(H)  Hemoglobin 13.0 - 17.0 g/dL 12.9(L) 13.7 11.9(L)  Hematocrit 39.0 - 52.0 % 38.5(L) 40.7 34.7(L)  Platelets 150 - 400 K/uL 449(H) 435(H) 282    . CMP Latest Ref Rng & Units 05/16/2018 05/09/2018 05/08/2018  Glucose 70 - 99 mg/dL 82 93 145(H)  BUN 6 - 20 mg/dL _0 Creatinine 0.61 - 1.24 mg/dL 1.03 0.89 1.08  Sodium 135 - 145 mmol/L 139 135 137  Potassium 3.5 - 5.1 mmol/L 4.3 3.5 3.4(L)  Chloride 98 - 111 mmol/L 101 105 105  CO2 22 - 32 mmol/L _1 Calcium 8.9 - 10.3 mg/dL 10.1 8.9 9.2  Total Protein 6.5 - 8.1 g/dL 9.6(H) - 7.7  Total Bilirubin 0.3 - 1.2 mg/dL 0.5 - 1.0  Alkaline Phos 38 - 126 U/L 111 - 76  AST 15 - 41 U/L 19 - 14(L)  ALT 0 - 44 U/L 33 - 13   05/10/18 Biopsy:    RADIOGRAPHIC STUDIES: I have personally reviewed the radiological images as listed and agreed with the findings in the report. Dg Chest 2 View  Result Date: 05/07/2018 CLINICAL DATA:  Mid to LEFT side chest pain for 2 days, painful to take a deep breath EXAM: CHEST - 2 VIEW COMPARISON:  None FINDINGS: Normal heart size. Pulmonary vascularity normal. Abnormal soft tissue density at the superior mediastinum, AP window, RIGHT paratracheal, and RIGHT hilum highly suggestive at adenopathy. Lungs clear. No infiltrate, pleural effusion or pneumothorax. Spina bifida occulta of C7. IMPRESSION: Suspected mediastinal and RIGHT hilar adenopathy; this can be seen with multiple etiologies including infection, other inflammatory processes, lymphoproliferative disorders, and malignancy. CT chest with contrast recommended for further evaluation.  Electronically Signed   By: Lavonia Dana M.D.   On: 05/07/2018 18:00   Ct Angio Chest Pe W/cm &/or Wo Cm  Result Date: 05/07/2018 CLINICAL DATA:  Abnormal chest radiograph showing an enlarged mediastinum. Chest pain for 2 days. Afebrile. EXAM: CT ANGIOGRAPHY CHEST WITH CONTRAST TECHNIQUE: Multidetector CT imaging of the chest was performed using the standard protocol during bolus administration  of intravenous contrast. Multiplanar CT image reconstructions and MIPs were obtained to evaluate the vascular anatomy. CONTRAST:  1101m ISOVUE-370 IOPAMIDOL (ISOVUE-370) INJECTION 76% COMPARISON:  Chest radiograph from same day. FINDINGS: Cardiovascular: Included heart size is top normal with small pericardial effusion measuring up 1 cm in thickness. No thoracic aortic aneurysm or dissection. No acute pulmonary embolus. Patent great vessels with conventional branch pattern. Mediastinum/Nodes: Confluent appearing noncalcified soft tissue masses are noted within the superior mediastinum bilaterally. On the right, the largest conglomeration measures approximately a 9.9 x 7.1 x 5.6 cm and on the left, 5.9 x 4.1 x 4 cm. Smaller paratracheal soft tissue masses are noted. Nonpathologic size hilar lymph nodes are identified the largest on the right approximately 1.1 cm short axis. Lungs/Pleura: Scattered ground-glass opacities likely representing areas of alveolitis/pneumonitis or hypoventilatory change are identified. No dominant mass effusion or pneumothorax. Trace pleural effusions bilaterally. Upper Abdomen: No splenomegaly. The unenhanced liver, included adrenal glands and kidneys are unremarkable. Musculoskeletal: No acute nor aggressive osseous abnormality. No axillary lymphadenopathy. Review of the MIP images confirms the above findings. IMPRESSION: Bulky, bilateral anterior superior mediastinal confluent soft tissue masses which are not calcified in appearance and which do not appear to cause occlusion or significant  luminal narrowing of traversing vasculature. Leading consideration is lymphoma, possibly Hodgkin's. Given lack of differentiating soft tissue densities, a teratoma is believed less likely. Nonseminomatous germ cell tumor given presence of small effusions might also be within differential though no pulmonary lesions are visualized. Metastatic disease, infection or inflammatory process are believed less likely. Electronically Signed   By: DAshley RoyaltyM.D.   On: 05/07/2018 21:45   Ct Abdomen Pelvis W Contrast  Result Date: 05/08/2018 CLINICAL DATA:  Unintended weight loss. Mediastinal mass on recent chest CT. Epigastric pain EXAM: CT ABDOMEN AND PELVIS WITH CONTRAST TECHNIQUE: Multidetector CT imaging of the abdomen and pelvis was performed using the standard protocol following bolus administration of intravenous contrast. CONTRAST:  1014mOMNIPAQUE IOHEXOL 300 MG/ML  SOLN COMPARISON:  Chest CT from yesterday FINDINGS: Lower chest:  Trace left pleural effusion Hepatobiliary: No focal liver abnormality.No evidence of biliary obstruction or stone. Pancreas: Unremarkable. Spleen: Unremarkable. Adrenals/Urinary Tract: Negative adrenals. No hydronephrosis or stone. 14 mm simple appearing right renal cyst. Unremarkable bladder. Stomach/Bowel: No obstruction. No appendicitis. Formed stool throughout the colon Vascular/Lymphatic: No acute vascular abnormality. No mass or adenopathy. Reproductive:No pathologic findings. Other: No ascites or pneumoperitoneum. Musculoskeletal: No evident bone lesion. Mild disc bulge and retrolisthesis at L5-S1. IMPRESSION: No acute finding or evidence of malignancy in the abdomen. Electronically Signed   By: JoMonte Fantasia.D.   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 EXAM: NUCLEAR MEDICINE PET SKULL BASE TO THIGH TECHNIQUE: 8.82 mCi F-18 FDG was  injected intravenously. Full-ring PET imaging was performed from the skull base to thigh after the radiotracer. CT data was obtained and used for attenuation correction and anatomic localization. Fasting blood glucose: 110 mg/dl COMPARISON:  05/08/2018 FINDINGS: Mediastinal blood pool activity: SUV max 1.99 Liver activity: SUV max 2.46. NECK: No hypermetabolic lymph nodes in the neck. Incidental CT findings: none CHEST: No hypermetabolic axillary or supraclavicular lymph nodes. Bulky, predominantly anterior mediastinal hypermetabolic adenopathy is identified. -Left-sided pre-vascular lymph node measures 3.6 x 4.2 cm with SUV max of 4.4. -right paratracheal lymph node measures 1.6 cm with  SUV max of 7.7. -right  pre-vascular lymph node measures 6.3 x 4.1 cm and has an SUV max of 6.82. -low left paratracheal lymph node measures 1 cm and has an SUV max of 3.75. No hypermetabolic hilar lymph nodes. Pericardial effusion is again noted, unchanged. Incidental CT findings: No pleural effusion. No airspace consolidation. ABDOMEN/PELVIS: No abnormal uptake identified within the liver, pancreas, or spleen. No abnormal uptake within the adrenal glands. No hypermetabolic lymph nodes within the abdomen or pelvis. No hypermetabolic inguinal lymph nodes. The spleen appears normal in size without abnormal uptake. Incidental CT findings: none SKELETON: No focal hypermetabolic activity to suggest skeletal metastasis. Incidental CT findings: none IMPRESSION: 1. Hypermetabolic mediastinal adenopathy is identified compatible with history of lymphoma. No hypermetabolic lymph nodes within the neck, abdomen or pelvis. Electronically Signed   By: Kerby Moors M.D.   On: 05/21/2018 15:25   Ct Biopsy  Result Date: 05/22/2018 INDICATION: 22 year old male with a history of Hodgkin's lymphoma EXAM: CT-GUIDED BONE MARROW BIOPSY MEDICATIONS: None. ANESTHESIA/SEDATION: Moderate (conscious) sedation was employed during this procedure. A total  of Versed 3.0 mg and Fentanyl 100 mcg was administered intravenously. Moderate Sedation Time: 10 minutes. The patient's level of consciousness and vital signs were monitored continuously by radiology nursing throughout the procedure under my direct supervision. FLUOROSCOPY TIME:  CT COMPLICATIONS: None PROCEDURE: The procedure risks, benefits, and alternatives were explained to the patient. Questions regarding the procedure were encouraged and answered. The patient understands and consents to the procedure. Scout CT of the pelvis was performed for surgical planning purposes. The posterior pelvis was prepped with Chlorhexidine in a sterile fashion, and a sterile drape was applied covering the operative field. A sterile gown and sterile gloves were used for the procedure. Local anesthesia was provided with 1% Lidocaine. Posterior iliac bone was targeted for biopsy. The skin and subcutaneous tissues were infiltrated with 1% lidocaine without epinephrine. A small stab incision was made with an 11 blade scalpel, and an 11 gauge Murphy needle was advanced with CT guidance to the posterior cortex. Manual forced was used to advance the needle through the posterior cortex and the stylet was removed. A bone marrow aspirate was retrieved and passed to a cytotechnologist in the room. The Murphy needle was then advanced without the stylet for a core biopsy. The core biopsy was retrieved and also passed to a cytotechnologist. Manual pressure was used for hemostasis and a sterile dressing was placed. No complications were encountered no significant blood loss was encountered. Patient tolerated the procedure well and remained hemodynamically stable throughout. IMPRESSION: Status post CT-guided bone marrow biopsy, with tissue specimen sent to pathology for complete histopathologic analysis Signed, Dulcy Fanny. Earleen Newport, DO [facility] Radiology Electronically Signed   By: Corrie Mckusick D.O.    On: 05/22/2018 11:10   Ct Bone Marrow Biopsy & Aspiration  Result Date: 05/22/2018 INDICATION: 22 year old male with a history of Hodgkin's lymphoma EXAM: CT-GUIDED BONE MARROW BIOPSY MEDICATIONS: None. ANESTHESIA/SEDATION: Moderate (conscious) sedation was employed during this procedure. A total of Versed 3.0 mg and Fentanyl 100 mcg was administered intravenously. Moderate Sedation Time: 10 minutes. The patient's level of consciousness and vital signs were monitored continuously by radiology nursing throughout the procedure under my direct supervision. FLUOROSCOPY TIME:  CT COMPLICATIONS: None PROCEDURE: The procedure risks, benefits, and alternatives were explained to the patient. Questions regarding the procedure were encouraged and answered. The patient understands and consents to the procedure. Scout CT of the pelvis was performed for surgical planning purposes. The posterior  pelvis was prepped with Chlorhexidine in a sterile fashion, and a sterile drape was applied covering the operative field. A sterile gown and sterile gloves were used for the procedure. Local anesthesia was provided with 1% Lidocaine. Posterior iliac bone was targeted for biopsy. The skin and subcutaneous tissues were infiltrated with 1% lidocaine without epinephrine. A small stab incision was made with an 11 blade scalpel, and an 11 gauge Murphy needle was advanced with CT guidance to the posterior cortex. Manual forced was used to advance the needle through the posterior cortex and the stylet was removed. A bone marrow aspirate was retrieved and passed to a cytotechnologist in the room. The Murphy needle was then advanced without the stylet for a core biopsy. The core biopsy was retrieved and also passed to a cytotechnologist. Manual pressure was used for hemostasis and a sterile dressing was placed. No complications were encountered no significant blood loss was encountered. Patient tolerated the procedure well and remained  hemodynamically stable throughout. IMPRESSION: Status post CT-guided bone marrow biopsy, with tissue specimen sent to pathology for complete histopathologic analysis Signed, Dulcy Fanny. Earleen Newport, DO [facility] Radiology Electronically Signed   By: Corrie Mckusick D.O.   On: 05/22/2018 11:10    ASSESSMENT & PLAN:   22 y.o. male with  1. Nodular sclerosis Hodgkin's Lymphoma  05/07/18 CTA Chest revealed Bulky, bilateral anterior superior mediastinal confluent soft tissue masses which are not calcified in appearance and which do not appear to cause occlusion or significant luminal narrowing of traversing vasculature. Leading consideration is lymphoma, possibly Hodgkin's. Given lack of differentiating soft tissue densities, a teratoma is believed less likely. Nonseminomatous germ cell tumor given presence of small effusions might also be within differential though no pulmonary lesions are visualized. Metastatic disease, infection or inflammatory process are believed less likely.    05/08/18 CT A/P revealed No acute finding or evidence of malignancy in the abdomen.  05/08/18 ECHO revealed LV EF of 55-60%   05/10/18 Scalene lymph node Biopsy revealed Nodular sclerosis Hodgkin's Lymphoma    PLAN:  -Discussed the risk of infertility that can result from treatment, and the recommendation for sperm banking. Provided contact for [place] [facility]. -Did order 43m Prednisone each day for one week -Discussed pt labwork from 05/22/18; blood counts are stable  -05/16/18 Sed Rate at 24 -Discussed the 05/08/18 ECHO which revealed LV EF of 55-60% -05/16/18 Hep B, Hep C, and HIV labs all negative  -Discussed the 05/21/18 PET/CT which revealed Hypermetabolic mediastinal adenopathy is identified compatible with history of lymphoma. No hypermetabolic lymph nodes within the neck, abdomen or pelvis. -Discussed that the staging indicated so far is a Stage I/II bulky    -05/22/18 BM Bx results are pending -Proceed with Pulmonary function test on 05/29/18 -Proceed with Port placement on 06/03/18  -Discussed the NCCN guidelines for Stage I/II bulky mediastinal disease which recommend ABVD for 2 cycles followed by a restaging PET/CT and re-evaluation of treatment course. Also discussed that the pending PFT and BM Bx could alter this treatment to AAVD.  -Will plan to have pt seen in one week for final treatment decision with BM Bx and PFT results -If BM negative and PFT is normal, will treat with ABVD for 2 cycles followed by repeat PET/CT and if responsive, AVD for up to 4 cycles +/- ISRT -If BM negative and abnormal PFT, will replace Bleomycin with Adcentris, even with stage II disease  -If BM bx results are positive will treat with  A-AVD for 6 cycles (might need G-CSF support) -Plan to begin treatment the week of 06/03/18 after port placement    PFTs as scheduled on 11/6 RTC with Dr Audelia Hives  05/31/2018 for treatment decision Port a cath as scheduled on 06/03/2018 with labs Plan to start A-AVD vs ABVD on 06/06/2018 RTC with Dr Irene Limbo in 1 weeks after C1D1 of treatment with labs for toxicity check   All of the patients questions were answered with apparent satisfaction. The patient knows to call the clinic with any problems, questions or concerns.  The total time spent in the appt was 40 minutes and more than 50% was on counseling and direct patient cares.    Sullivan Lone MD MS AAHIVMS [facility] Hematology/Oncology Physician [facility]  (Office):       [phone] (Work cell):  [phone] (Fax):           [phone]  05/23/2018 12:32 PM  I, Baldwin [place], am acting as a scribe for Dr. Irene Limbo  .I have reviewed the above documentation for accuracy and completeness, and I agree with the above. [name] MD

## 2018-05-23 NOTE — Telephone Encounter (Signed)
Appts scheduled avs/calendar printed per 10/31 los °

## 2018-05-24 ENCOUNTER — Ambulatory Visit (HOSPITAL_COMMUNITY): Payer: BLUE CROSS/BLUE SHIELD

## 2018-05-27 ENCOUNTER — Telehealth: Payer: Self-pay | Admitting: *Deleted

## 2018-05-27 ENCOUNTER — Other Ambulatory Visit (HOSPITAL_COMMUNITY): Payer: BLUE CROSS/BLUE SHIELD

## 2018-05-27 NOTE — Telephone Encounter (Signed)
Patient's sister David Lam called. Asked multiple questions regarding patient appointments including Locations and estimated times, what the set up in infusion area was like with regard to visitors. Encouraged someone in family to come with patient to chemo class for more information.  Explained times as posted on appointments with reminder that actual times can vary depending on other circumstances. Caller verbalized understanding and thanked this Probation officer.

## 2018-05-29 ENCOUNTER — Inpatient Hospital Stay (HOSPITAL_COMMUNITY): Admission: RE | Admit: 2018-05-29 | Payer: BLUE CROSS/BLUE SHIELD | Source: Ambulatory Visit

## 2018-05-30 ENCOUNTER — Ambulatory Visit (HOSPITAL_COMMUNITY)
Admission: RE | Admit: 2018-05-30 | Discharge: 2018-05-30 | Disposition: A | Discharge status: Home / Self Care | Payer: BLUE CROSS/BLUE SHIELD | Source: Ambulatory Visit | Attending: Hematology | Admitting: Hematology

## 2018-05-30 ENCOUNTER — Other Ambulatory Visit: Payer: Self-pay | Admitting: Radiology

## 2018-05-30 ENCOUNTER — Other Ambulatory Visit: Payer: Self-pay | Admitting: Hematology and Oncology

## 2018-05-30 DIAGNOSIS — D473 Essential (hemorrhagic) thrombocythemia: Secondary | ICD-10-CM

## 2018-05-30 DIAGNOSIS — C8118 Nodular sclerosis classical Hodgkin lymphoma, lymph nodes of multiple sites: Secondary | ICD-10-CM | POA: Diagnosis present

## 2018-05-30 DIAGNOSIS — D75839 Thrombocytosis, unspecified: Secondary | ICD-10-CM

## 2018-05-30 DIAGNOSIS — R718 Other abnormality of red blood cells: Secondary | ICD-10-CM

## 2018-05-30 DIAGNOSIS — J9859 Other diseases of mediastinum, not elsewhere classified: Secondary | ICD-10-CM

## 2018-05-30 LAB — PULMONARY FUNCTION TEST
DL/VA % pred: 164 %
DL/VA: 7.54 ml/min/mmHg/L
DLCO COR: 36.34 ml/min/mmHg
DLCO UNC % PRED: 116 %
DLCO UNC: 34.47 ml/min/mmHg
DLCO cor % pred: 122 %
FEF 25-75 PRE: 2.05 L/s
FEF 25-75 Post: 3.62 L/sec
FEF2575-%CHANGE-POST: 76 %
FEF2575-%PRED-PRE: 47 %
FEF2575-%Pred-Post: 84 %
FEV1-%Change-Post: 20 %
FEV1-%PRED-POST: 90 %
FEV1-%Pred-Pre: 74 %
FEV1-POST: 3.38 L
FEV1-Pre: 2.8 L
FEV1FVC-%Change-Post: 9 %
FEV1FVC-%Pred-Pre: 85 %
FEV6-%CHANGE-POST: 9 %
FEV6-%PRED-PRE: 87 %
FEV6-%Pred-Post: 96 %
FEV6-Post: 4.2 L
FEV6-Pre: 3.82 L
FEV6FVC-%Change-Post: 0 %
FEV6FVC-%Pred-Post: 100 %
FEV6FVC-%Pred-Pre: 100 %
FVC-%Change-Post: 9 %
FVC-%Pred-Post: 95 %
FVC-%Pred-Pre: 87 %
FVC-Post: 4.21 L
FVC-Pre: 3.84 L
POST FEV1/FVC RATIO: 80 %
PRE FEV1/FVC RATIO: 73 %
Post FEV6/FVC ratio: 100 %
Pre FEV6/FVC Ratio: 100 %
RV % pred: 124 %
RV: 1.7 L
TLC % pred: 78 %
TLC: 5.09 L

## 2018-05-30 MED ORDER — ALBUTEROL SULFATE (2.5 MG/3ML) 0.083% IN NEBU
2.5000 mg | INHALATION_SOLUTION | Freq: Once | RESPIRATORY_TRACT | Status: AC
  Administered 2018-05-30: 2.5 mg via RESPIRATORY_TRACT

## 2018-05-31 ENCOUNTER — Inpatient Hospital Stay: Payer: BLUE CROSS/BLUE SHIELD | Attending: Hematology | Admitting: Hematology and Oncology

## 2018-05-31 ENCOUNTER — Other Ambulatory Visit: Payer: Self-pay | Admitting: Hematology and Oncology

## 2018-05-31 ENCOUNTER — Other Ambulatory Visit: Payer: Self-pay | Admitting: Radiology

## 2018-05-31 ENCOUNTER — Encounter: Payer: Self-pay | Admitting: Hematology and Oncology

## 2018-05-31 VITALS — BP 132/78 | HR 81 | Temp 98.1°F | Resp 18 | Ht 68.0 in | Wt 182.4 lb

## 2018-05-31 DIAGNOSIS — G629 Polyneuropathy, unspecified: Secondary | ICD-10-CM | POA: Diagnosis not present

## 2018-05-31 DIAGNOSIS — Z5111 Encounter for antineoplastic chemotherapy: Secondary | ICD-10-CM | POA: Insufficient documentation

## 2018-05-31 DIAGNOSIS — C8118 Nodular sclerosis classical Hodgkin lymphoma, lymph nodes of multiple sites: Secondary | ICD-10-CM | POA: Diagnosis present

## 2018-05-31 MED ORDER — ONDANSETRON HCL 8 MG PO TABS: 8.0000 mg | ORAL_TABLET | Freq: Two times a day (BID) | ORAL | 2 refills | Status: AC | PRN

## 2018-05-31 MED ORDER — PROCHLORPERAZINE MALEATE 10 MG PO TABS: 10.0000 mg | ORAL_TABLET | Freq: Four times a day (QID) | ORAL | 0 refills | Status: DC | PRN

## 2018-05-31 NOTE — Progress Notes (Signed)
Hematology/Oncology Outpatient Progress Note  Patient Name:  David Lam  DOB: 03/30/1996   Date of Service: May 31, 2018  Referring Provider: Lanelle Bal, MD [place] [address] [place], [place] [address]   Consulting Physician: Henreitta Leber, MD Hematology/Oncology  Reason for Visit: In the setting of stage II nodular sclerosis Hodgkin lymphoma, he presents now prior to administration of cycle 1A of doxorubicin, bleomycin, vinblastine, and dacarbazine beginning June 05, 2018.  Brief History: David Lam is a 22 year old resident of [place] without significant past medical history until he presented to the emergency department on October 15 with chest pain over 2 days worsened with positional change.  A two-view chest x-ray revealed a mediastinal mass that was suspicious.  A CT thoracic angiogram on October 15 revealed bulky bilateral anterior superior mediastinal confluent soft tissue masses which were not calcified in appearance.  They did not cause occlusion or significant luminal narrowing of the traversing vasculature.  No pulmonary lesions were identified.   On May 10, 2018 the results of his mediastinal and scalene lymph node biopsy revealed nodular sclerosis Hodgkin lymphoma.  Aside from the chest discomfort, there were no recent fevers, sweats, shaking chills, or flulike symptoms.  He had no skin rash or unexpected weight loss.  Since starting on prednisone, his chest pain has improved significantly and he is sleeping without difficulty.  His primary care physician is Dr. Lanelle Bal.  He is accompanied by his friend, Janett Billow.   Conventional CT imaging of the chest, abdomen, and pelvis with intravenous contrast revealed no acute visceral disease within the abdomen or pelvis.  His initial sedimentation rate was 19.  At the time of his initial visit, treatment with multiagent chemotherapy was discussed in detail.  Because of the possible use  of bleomycin, pulmonary function testing was performed on November 7.  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  He had a favorable response to bronchodilators.    On May 22, 2018 a bone marrow aspiration and biopsy revealed a cellular marrow consistent with his age.  The myeloid and erythroid elements were normal in numbers and proportion.  Megakaryocytes are slightly increased but demonstrated a normal spectrum of maturation without clustering.  There were no atypical lymphoid aggregates or granulomas identified.  CD30 by immunohistochemistry did not highlight any atypical cells resembling Reed-Sternberg cells.  Iron stores were present but diminished.  Ringed sideroblasts were not identified.  In short, there was no morphologic or immunophenotypic evidence of Hodgkin lymphoma.  He completed a short pulse course of prednisone on October 31. He is a lifetime non-smoker. Under the direction of Dr. Irene Limbo, arrangements have been made for placement of a subcutaneous venous access device on November 11.  He is scheduled for a chemotherapy educational session with our nurse educator on November 12.  In the absence of any new problems or complaints, his first cycle of doxorubicin, bleomycin, vinblastine, and dacarbazine will begin on November 13.  It is with this background he presents now to review his laboratory studies and further discussion his initial treatment with multiagent chemotherapy, his pulmonary function testing and bone marrow aspiration and biopsy.  Interval History: In the interim since his last visit, he continues to have some soreness at the site of bone marrow puncture.  There is no skin changes or purulent discharge.  Both his appetite and weight remain stable.  He has no rash or itching.  There are no unusual headaches, dizziness, lightheadedness, syncope, or near syncopal episodes.  He reports no chest or abdominal pain.  There is no pain or difficulty in swallowing.  No  nausea, vomiting, diarrhea, or constipation are reported.  He has no fever, shaking chills, sweats, or flulike symptoms.  He has no epistaxis or hemoptysis.  There is no melena or bright red blood per rectum.  No urinary frequency, urgency, hematuria, dysuria are evident.  He has no numbness or tingling in his fingers or toes.  There are no new focal areas of bone, joint, muscle plan.  His overall energy level remains normal and consistent with his baseline.  Past Medical History Reviewed        Family History Reviewed       Social History Reviewed  Allergies:  No Known Allergies   Current Outpatient Medications on File Prior to Visit  Medication Sig  . diphenhydrAMINE (BENADRYL) 25 mg capsule Take 25 mg by mouth as needed.  . traMADol (ULTRAM) 50 MG tablet Take 1 tablet (50 mg total) by mouth every 6 (six) hours as needed.   No current facility-administered medications on file prior to visit.     Review of Systems: Constitutional: No fever, sweats, or shaking chills.  No appetite or weight deficit. Skin: No rash, scaling, sores, lumps, or jaundice. HEENT: No visual changes or hearing deficit. Pulmonary: No unusual cough, sore throat, or orthopnea. Cardiovascular: No coronary artery disease, angina, or myocardial infarction.  No cardiac dysrhythmia, essential hypertension, or dyslipidemia. Gastrointestinal: No indigestion, dysphagia, abdominal pain, diarrhea, or constipation.  No change in bowel habits.  No nausea or vomiting.  No melena or bright red blood per rectum. Genitourinary: No urinary frequency, urgency, hematuria, or dysuria. Musculoskeletal: No arthralgias or myalgias; no joint swelling, pain, or instability. Hematologic: No bleeding tendency or easy bruisability. Endocrine: No intolerance to hot or cold; no thyroid disease or diabetes mellitus. Vascular: No peripheral arterial or venous thromboembolic disease. Psychological: No anxiety, depression, or mood changes; no mental  health illnesses. Neurological: No dizziness, lightheadedness, syncope, or near syncopal episodes; no numbness or tingling in the fingers or toes.  Physical Examination: Vital Signs: Body surface area is 1.99 meters squared.  Vitals:   05/31/18 1624  BP: 132/78  Pulse: 81  Resp: 18  Temp: 98.1 F (36.7 C)  SpO2: 99%    Filed Weights   05/31/18 1624  Weight: 182 lb 6.4 oz (82.7 kg)  Body mass index is 27.73 kg/m. Constitutional: David Lam is a fully nourished and developed African-American.  He looks age appropriate.  He is friendly and cooperative without respiratory compromise at rest. Skin: No rashes, scaling, dryness, jaundice, or itching. HEENT: Head is normocephalic and atraumatic.  Pupils are equal round and reactive to light and accommodation.  Sclerae are anicteric.  Conjunctivae are pink.  No sinus tenderness nor oropharyngeal lesions.  Lips without cracking or peeling; tongue without mass, inflammation, or nodularity.  Mucous membranes are moist. Neck: Supple and symmetric.  No jugular venous distention or thyromegaly.  Trachea is midline. Lymphatics: No cervical or supraclavicular lymphadenopathy.  No epitrochlear, axillary, or inguinal lymphadenopathy is appreciated.  The site of biopsy in his left cervical neck is healed completely. Respiratory/chest: Thorax is symmetrical.  Breath sounds are clear to auscultation and percussion.  Normal excursion and respiratory effort. Back: Symmetric without deformity or tenderness. Cardiovascular: Heart rate and rhythm are regular without murmurs, gallops, or rubs. Gastrointestinal: Abdomen is soft, nontender; no organomegaly.  Bowel sounds are normoactive.  No masses are appreciated. Extremities: In the lower extremities,  there is no asymmetric swelling, erythema, tenderness, or cord formation.  No clubbing, cyanosis, nor edema. Hematologic: No petechiae, hematomas, or ecchymoses. Psychological: He is oriented to person, place,  and time; normal affect, memory, and cognition. Neurological: There are no gross neurologic deficits.  Laboratory Results:  Ref Range & Units 9d ago (05/22/18) 2wk ago (05/16/18) 3wk ago (05/09/18) 3wk ago (05/08/18) 3wk ago (05/07/18)  WBC 4.0 - 10.5 K/uL 11.0High   14.6High   10.6High   11.9High     RBC 4.22 - 5.81 MIL/uL 4.86  5.10  4.42  4.78    Hemoglobin 13.0 - 17.0 g/dL 12.9Low   13.7  11.9Low   12.6Low     HCT 39.0 - 52.0 % 38.5Low   40.7  34.7Low   37.6Low     MCV 80.0 - 100.0 fL 79.2Low   79.8Low   78.5Low   78.7Low     MCH 26.0 - 34.0 pg 26.5  26.9  26.9  26.4    MCHC 30.0 - 36.0 g/dL 33.5  33.7  34.3  33.5    RDW 11.5 - 15.5 % 14.7  14.3  14.6  14.6    Platelets 150 - 400 K/uL 449High   435High   282  322    nRBC 0.0 - 0.2 % 0.0  0.0  0.0  0.0  0 R  Neutrophils Relative % % 77  87  74  81  69   Neutro Abs 1.7 - 7.7 K/uL 8.4High   12.6High   7.9High   9.7High   8.2High    Lymphocytes Relative % '15  6  10  9  19   ' Lymphs Abs 0.7 - 4.0 K/uL 1.6  0.9  1.1  1.1  1.0   Monocytes Relative % '8  5  10  8  8   ' Monocytes Absolute 0.1 - 1.0 K/uL 0.9  0.7  1.0  0.9  1.0   Eosinophils Relative % 0  '2  5  2  3   ' Eosinophils Absolute 0.0 - 0.5 K/uL 0.0  0.3  0.6High   0.2  0.2   Basophils Relative % 0  0  1  0  1   Basophils Absolute 0.0 - 0.1 K/uL 0.0  0.1  0.1  0.0  0.0   Immature Granulocytes % 0  0  0  0    Abs Immature Granulocytes 0.00 - 0.07 K/uL 0.03  0.03 CM 0.02 CM 0.02 CM     Ref Range & Units 2wk ago (05/16/18) 3wk ago (05/09/18) 3wk ago (05/08/18) 3wk ago (05/08/18) 3wk ago (05/07/18)  Sodium 135 - 145 mmol/L 139  135   137  137   Potassium 3.5 - 5.1 mmol/L 4.3  3.5   3.4Low   3.9   Chloride 98 - 111 mmol/L 101  105   105  103   CO2 22 - 32 mmol/L '28  24   26  25   ' Glucose, Bld 70 - 99 mg/dL 82  93   145High   82   BUN 6 - 20 mg/dL '12  12   11  11   ' Creatinine 0.61 - 1.24 mg/dL 1.03  0.89   1.08  1.07   Calcium 8.9 - 10.3 mg/dL 10.1  8.9   9.2  9.5   Total  Protein 6.5 - 8.1 g/dL 9.6High    7.7     Albumin 3.5 - 5.0 g/dL 4.1   3.5  AST 15 - 41 U/L 19   14Low      ALT 0 - 44 U/L 33   13     Alkaline Phosphatase 38 - 126 U/L 111   76     Total Bilirubin 0.3 - 1.2 mg/dL 0.5   1.0     GFR, Est Non Af Am >60 mL/min >60  >60   >60  >60   GFR, Est AFR Am >60 mL/min >60  >60 CM  >60 CM >60 CM  ESR 24  Diagnostic/Imaging Studies: NUCLEAR MEDICINE PET SKULL BASE TO THIGH  TECHNIQUE: 8.82 mCi F-18 FDG was injected intravenously. Full-ring PET imaging was performed from the skull base to thigh after the radiotracer. CT data was obtained and used for attenuation correction and anatomic localization.  Fasting blood glucose: 110 mg/dl  COMPARISON:  05/08/2018  FINDINGS: Mediastinal blood pool activity: SUV max 1.99  Liver activity: SUV max 2.46.  NECK: No hypermetabolic lymph nodes in the neck.  Incidental CT findings: none  CHEST: No hypermetabolic axillary or supraclavicular lymph nodes. Bulky, predominantly anterior mediastinal hypermetabolic adenopathy is identified.  -Left-sided pre-vascular lymph node measures 3.6 x 4.2 cm with SUV max of 4.4.  -right paratracheal lymph node measures 1.6 cm with  SUV max of 7.7.  -right pre-vascular lymph node measures 6.3 x 4.1 cm and has an SUV max of 6.82.  -low left paratracheal lymph node measures 1 cm and has an SUV max of 3.75.  No hypermetabolic hilar lymph nodes.  Pericardial effusion is again noted, unchanged.  Incidental CT findings: No pleural effusion. No airspace consolidation.  ABDOMEN/PELVIS: No abnormal uptake identified within the liver, pancreas, or spleen. No abnormal uptake within the adrenal glands. No hypermetabolic lymph nodes within the abdomen or pelvis. No hypermetabolic inguinal lymph nodes. The spleen appears normal in size without abnormal uptake.  Incidental CT findings: none  SKELETON: No focal hypermetabolic activity to suggest  skeletal metastasis.  Incidental CT findings: none  IMPRESSION: 1. Hypermetabolic mediastinal adenopathy is identified compatible with history of lymphoma. No hypermetabolic lymph nodes within the neck, abdomen or pelvis.  Kerby Moors M.D. 05/21/2018 15:25  Summary/Assessment: In the setting of stage II nodular sclerosis Hodgkin lymphoma, he presents now prior to administration of cycle 1A of doxorubicin, bleomycin, vinblastine, and dacarbazine beginning June 05, 2018.  David Lam presented to the emergency department on October 15 with chest pain over 2 days worsened with positional change.  A two-view chest x-ray revealed a mediastinal mass that was suspicious.  A CT thoracic angiogram on October 15 revealed bulky bilateral anterior superior mediastinal confluent soft tissue masses which were not calcified in appearance.  They did not cause occlusion or significant luminal narrowing of the traversing vasculature.  No pulmonary lesions were identified.   On May 10, 2018 the results of his mediastinal and scalene lymph node biopsy revealed nodular sclerosis Hodgkin lymphoma.  Aside from the chest discomfort, there were no recent fevers, sweats, shaking chills, or flulike symptoms.  He had no skin rash or unexpected weight loss.  Since starting on prednisone, his chest pain has improved significantly and he is sleeping without difficulty.  His primary care physician is Dr. Lanelle Bal.  He is accompanied by his friend, Janett Billow.   Conventional CT imaging of the chest, abdomen, and pelvis with intravenous contrast revealed no acute visceral disease within the abdomen or pelvis.  His initial sedimentation rate was 19.  At the time of his initial visit, treatment with multiagent  chemotherapy was discussed in detail.  Because of the possible use of bleomycin, pulmonary function testing was performed on November 7.  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  He had a  favorable response to bronchodilators.    On May 22, 2018 a bone marrow aspiration and biopsy revealed a cellular marrow consistent with his age.  The myeloid and erythroid elements were normal in numbers and proportion.  Megakaryocytes are slightly increased but demonstrated a normal spectrum of maturation without clustering.  There were no atypical lymphoid aggregates or granulomas identified.  CD30 by immunohistochemistry did not highlight any atypical cells resembling Reed-Sternberg cells.  Iron stores were present but diminished.  Ringed sideroblasts were not identified.  In short, there was no morphologic or immunophenotypic evidence of Hodgkin lymphoma.  He completed a short pulse course of prednisone on October 31. He is a lifetime non-smoker. Under the direction of Dr. Irene Limbo, arrangements have been made for placement of a subcutaneous venous access device on November 11.  He is scheduled for a chemotherapy educational session with our nurse educator on November 12.  In the absence of any new problems or complaints, his first cycle of doxorubicin, bleomycin, vinblastine, and dacarbazine will begin on November 13.  It is with this background he presents now to review his laboratory studies and further discussion regarding his pulmonary function testing and bone marrow aspiration and biopsy.  In the interim since his last visit, he continues to have some soreness at the site of bone marrow puncture.  There is no skin changes or purulent discharge.  Both his appetite and weight remain stable.  He has no rash or itching.  There are no unusual headaches, dizziness, lightheadedness, syncope, or near syncopal episodes.  He reports no chest or abdominal pain.  There is no pain or difficulty in swallowing.  No nausea, vomiting, diarrhea, or constipation are reported.  He has no fever, shaking chills, sweats, or flulike symptoms.  He has no epistaxis or hemoptysis.  There is no melena or bright red blood per  rectum.  No urinary frequency, urgency, hematuria, dysuria are evident.  He has no numbness or tingling in his fingers or toes.  There are no new focal areas of bone, joint, muscle plan. His overall energy level remains normal and consistent with his baseline.  Recommendation/Plan: We discussed in detail the proposed treatment plan which includes 2 cycles of doxorubicin, bleomycin, vinblastine, and dacarbazine every 2 weeks.  Generally after 2 full cycles, diuresis and PET/CT scan will be performed and further decision-making with regard to consolidating radiotherapy or continued multiagent chemotherapy for an additional 2 cycles of treatment.  He is scheduled now for placement of a subcutaneous venous access device on November 11.  Chemotherapy education with our nurse educator is scheduled for November 12.  We reviewed once again his prior serologic testing which failed to reveal any evidence of hepatitis or HIV disease.  The results of his laboratory studies were reviewed also.  Those results are detailed above.  We discussed also the results of his pulmonary function testing which revealed a mild increase in diffusing capacity and slight obstructive changes similar to mild asthma.  He has no history of bronchial asthma or tobacco use.  Barring any unforeseen complications, day 1, cycle 1 of doxorubicin, bleomycin, vinblastine, and dacarbazine will commence on November 13.  Prior to administration of cycle 1A of therapy, antiemetics will be given as per protocol.  Because of potential gastrointestinal toxicity, ondansetron: 8 mg  every 12 hours will be given if necessary for post treatment nausea.  A prescription for prochlorperazine: 10 mg every 6 hours as needed for delayed chemotherapy-induced nausea was prescribed.  He has already a scheduled posttreatment appointment following day 1, cycle 1 of multiagent chemotherapy with Dr. Irene Limbo on November 19.  He was advised to call us in the  interim should any new or untoward problems arise.  The total time spent discussing the results of his bone marrow aspiration and biopsy, pulmonary function testing, and reviewing his serologic and cardiac evaluation with discussion was 30 minutes. At least 50% of that time was spent in face-to-face discussion, counseling, and answering questions.  This note was dictated using voice activated technology/software.  Unfortunately, typographical errors are not uncommon, and transcription is subject to mistakes and regrettably misinterpretation.  If necessary, clarification of the above information can be discussed with me at any time.  FOLLOW UP: AS DIRECTED   cc:        Lanelle Bal, MD              Sullivan Lone MD   Henreitta Leber, MD  Hematology/Oncology [place] [address] [place], [place] [address] Office: [phone] [place]: [phone]

## 2018-06-03 ENCOUNTER — Telehealth: Payer: Self-pay | Admitting: Hematology and Oncology

## 2018-06-03 ENCOUNTER — Ambulatory Visit (HOSPITAL_COMMUNITY)
Admission: RE | Admit: 2018-06-03 | Discharge: 2018-06-03 | Disposition: A | Discharge status: Home / Self Care | Payer: BLUE CROSS/BLUE SHIELD | Source: Ambulatory Visit | Attending: Hematology | Admitting: Hematology

## 2018-06-03 ENCOUNTER — Encounter (HOSPITAL_COMMUNITY): Payer: Self-pay

## 2018-06-03 ENCOUNTER — Encounter (HOSPITAL_COMMUNITY): Payer: Self-pay | Admitting: Hematology

## 2018-06-03 DIAGNOSIS — C8118 Nodular sclerosis classical Hodgkin lymphoma, lymph nodes of multiple sites: Secondary | ICD-10-CM

## 2018-06-03 HISTORY — PX: IR IMAGING GUIDED PORT INSERTION: IMG5740

## 2018-06-03 LAB — CBC WITH DIFFERENTIAL/PLATELET
Abs Immature Granulocytes: 0.06 10*3/uL (ref 0.00–0.07)
BASOS ABS: 0 10*3/uL (ref 0.0–0.1)
Basophils Relative: 0 %
EOS ABS: 0.5 10*3/uL (ref 0.0–0.5)
EOS PCT: 3 %
HEMATOCRIT: 39.4 % (ref 39.0–52.0)
HEMOGLOBIN: 13.4 g/dL (ref 13.0–17.0)
Immature Granulocytes: 0 %
LYMPHS ABS: 0.9 10*3/uL (ref 0.7–4.0)
Lymphocytes Relative: 6 %
MCH: 27.1 pg (ref 26.0–34.0)
MCHC: 34 g/dL (ref 30.0–36.0)
MCV: 79.8 fL — ABNORMAL LOW (ref 80.0–100.0)
Monocytes Absolute: 1.2 10*3/uL — ABNORMAL HIGH (ref 0.1–1.0)
Monocytes Relative: 8 %
NRBC: 0 % (ref 0.0–0.2)
Neutro Abs: 11.9 10*3/uL — ABNORMAL HIGH (ref 1.7–7.7)
Neutrophils Relative %: 83 %
Platelets: 278 10*3/uL (ref 150–400)
RBC: 4.94 MIL/uL (ref 4.22–5.81)
RDW: 15.8 % — ABNORMAL HIGH (ref 11.5–15.5)
WBC: 14.6 10*3/uL — AB (ref 4.0–10.5)

## 2018-06-03 LAB — PROTIME-INR
INR: 1.1
Prothrombin Time: 14.1 seconds (ref 11.4–15.2)

## 2018-06-03 MED ORDER — LIDOCAINE-EPINEPHRINE (PF) 2 %-1:200000 IJ SOLN
INTRAMUSCULAR | Status: AC
  Filled 2018-06-03: qty 20

## 2018-06-03 MED ORDER — HEPARIN SOD (PORK) LOCK FLUSH 100 UNIT/ML IV SOLN
INTRAVENOUS | Status: AC | PRN
  Administered 2018-06-03: 500 [IU] via INTRAVENOUS

## 2018-06-03 MED ORDER — FENTANYL CITRATE (PF) 100 MCG/2ML IJ SOLN
INTRAMUSCULAR | Status: AC | PRN
  Administered 2018-06-03 (×2): 50 ug via INTRAVENOUS

## 2018-06-03 MED ORDER — MIDAZOLAM HCL 2 MG/2ML IJ SOLN
INTRAMUSCULAR | Status: AC | PRN
  Administered 2018-06-03 (×4): 1 mg via INTRAVENOUS

## 2018-06-03 MED ORDER — HYDROCODONE-ACETAMINOPHEN 5-325 MG PO TABS
1.0000 | ORAL_TABLET | ORAL | Status: DC | PRN
  Administered 2018-06-03: 1 via ORAL
  Filled 2018-06-03: qty 1

## 2018-06-03 MED ORDER — LIDOCAINE HCL 1 % IJ SOLN
INTRAMUSCULAR | Status: AC
  Filled 2018-06-03: qty 20

## 2018-06-03 MED ORDER — FENTANYL CITRATE (PF) 100 MCG/2ML IJ SOLN
INTRAMUSCULAR | Status: AC
  Filled 2018-06-03: qty 2

## 2018-06-03 MED ORDER — SODIUM CHLORIDE 0.9 % IV SOLN
INTRAVENOUS | Status: DC
  Administered 2018-06-03: 08:00:00 via INTRAVENOUS

## 2018-06-03 MED ORDER — MIDAZOLAM HCL 2 MG/2ML IJ SOLN
INTRAMUSCULAR | Status: AC
  Filled 2018-06-03: qty 4

## 2018-06-03 MED ORDER — CEFAZOLIN SODIUM-DEXTROSE 2-4 GM/100ML-% IV SOLN
INTRAVENOUS | Status: AC
  Administered 2018-06-03: 2 g via INTRAVENOUS
  Filled 2018-06-03: qty 100

## 2018-06-03 MED ORDER — LIDOCAINE HCL (PF) 1 % IJ SOLN
INTRAMUSCULAR | Status: AC | PRN
  Administered 2018-06-03 (×2): 5 mL via SUBCUTANEOUS

## 2018-06-03 MED ORDER — CEFAZOLIN SODIUM-DEXTROSE 2-4 GM/100ML-% IV SOLN
2.0000 g | INTRAVENOUS | Status: AC
  Administered 2018-06-03: 2 g via INTRAVENOUS

## 2018-06-03 MED ORDER — HEPARIN SOD (PORK) LOCK FLUSH 100 UNIT/ML IV SOLN
INTRAVENOUS | Status: AC
  Filled 2018-06-03: qty 5

## 2018-06-03 NOTE — Discharge Instructions (Addendum)
Moderate Conscious Sedation, Adult, Care After °These instructions provide you with information about caring for yourself after your procedure. Your health care provider may also give you more specific instructions. Your treatment has been planned according to current medical practices, but problems sometimes occur. Call your health care provider if you have any problems or questions after your procedure. °What can I expect after the procedure? °After your procedure, it is common: °· To feel sleepy for several hours. °· To feel clumsy and have poor balance for several hours. °· To have poor judgment for several hours. °· To vomit if you eat too soon. ° °Follow these instructions at home: °For at least 24 hours after the procedure: ° °· Do not: °? Participate in activities where you could fall or become injured. °? Drive. °? Use heavy machinery. °? Drink alcohol. °? Take sleeping pills or medicines that cause drowsiness. °? Make important decisions or sign legal documents. °? Take care of children on your own. °· Rest. °Eating and drinking °· Follow the diet recommended by your health care provider. °· If you vomit: °? Drink water, juice, or soup when you can drink without vomiting. °? Make sure you have little or no nausea before eating solid foods. °General instructions °· Have a responsible adult stay with you until you are awake and alert. °· Take over-the-counter and prescription medicines only as told by your health care provider. °· If you smoke, do not smoke without supervision. °· Keep all follow-up visits as told by your health care provider. This is important. °Contact a health care provider if: °· You keep feeling nauseous or you keep vomiting. °· You feel light-headed. °· You develop a rash. °· You have a fever. °Get help right away if: °· You have trouble breathing. °This information is not intended to replace advice given to you by your health care provider. Make sure you discuss any questions you have  with your health care provider. °Document Released: 04/30/2013 Document Revised: 12/13/2015 Document Reviewed: 10/30/2015 °Elsevier Interactive Patient Education © 2018 [employer] °Implanted Port Home Guide °An implanted port is a type of central line that is placed under the skin. Central lines are used to provide IV access when treatment or nutrition needs to be given through a person’s veins. Implanted ports are used for long-term IV access. An implanted port may be placed because: °· You need IV medicine that would be irritating to the small veins in your hands or arms. °· You need long-term IV medicines, such as antibiotics. °· You need IV nutrition for a long period. °· You need frequent blood draws for lab tests. °· You need dialysis. ° °Implanted ports are usually placed in the chest area, but they can also be placed in the upper arm, the abdomen, or the leg. An implanted port has two main parts: °· Reservoir. The reservoir is round and will appear as a small, raised area under your skin. The reservoir is the part where a needle is inserted to give medicines or draw blood. °· Catheter. The catheter is a thin, flexible tube that extends from the reservoir. The catheter is placed into a large vein. Medicine that is inserted into the reservoir goes into the catheter and then into the vein. ° °How will I care for my incision site? °Do not get the incision site wet. Bathe or shower as directed by your health care provider. °How is my port accessed? °Special steps must be taken to access the port: °·   Before the port is accessed, a numbing cream can be placed on the skin. This helps numb the skin over the port site. °· Your health care provider uses a sterile technique to access the port. °? Your health care provider must put on a mask and sterile gloves. °? The skin over your port is cleaned carefully with an antiseptic and allowed to dry. °? The port is gently pinched between sterile gloves, and a needle is  inserted into the port. °· Only "non-coring" port needles should be used to access the port. Once the port is accessed, a blood return should be checked. This helps ensure that the port is in the vein and is not clogged. °· If your port needs to remain accessed for a constant infusion, a clear (transparent) bandage will be placed over the needle site. The bandage and needle will need to be changed every week, or as directed by your health care provider. °· Keep the bandage covering the needle clean and dry. Do not get it wet. Follow your health care provider’s instructions on how to take a shower or bath while the port is accessed. °· If your port does not need to stay accessed, no bandage is needed over the port. ° °What is flushing? °Flushing helps keep the port from getting clogged. Follow your health care provider’s instructions on how and when to flush the port. Ports are usually flushed with saline solution or a medicine called heparin. The need for flushing will depend on how the port is used. °· If the port is used for intermittent medicines or blood draws, the port will need to be flushed: °? After medicines have been given. °? After blood has been drawn. °? As part of routine maintenance. °· If a constant infusion is running, the port may not need to be flushed. ° °How long will my port stay implanted? °The port can stay in for as long as your health care provider thinks it is needed. When it is time for the port to come out, surgery will be done to remove it. The procedure is similar to the one performed when the port was put in. °When should I seek immediate medical care? °When you have an implanted port, you should seek immediate medical care if: °· You notice a bad smell coming from the incision site. °· You have swelling, redness, or drainage at the incision site. °· You have more swelling or pain at the port site or the surrounding area. °· You have a fever that is not controlled with  medicine. ° °This information is not intended to replace advice given to you by your health care provider. Make sure you discuss any questions you have with your health care provider. °Document Released: 07/10/2005 Document Revised: 12/16/2015 Document Reviewed: 03/17/2013 °Elsevier Interactive Patient Education © 2017 [employer] ° °

## 2018-06-03 NOTE — H&P (Signed)
Referring Physician(s): [name]  Supervising Physician: Markus Daft  Patient Status:  [facility]  Chief Complaint:  "I'm getting a port a cath"  Subjective: Patient familiar to IR service from prior bone marrow biopsy on 05/22/2018.  He has a history of newly diagnosed Hodgkin's lymphoma and presents today for Port-A-Cath placement for chemotherapy.  He currently denies fever, headache, chest pain, dyspnea, abdominal/back pain, nausea, vomiting or bleeding.  He does have occasional cough. History reviewed. No pertinent past medical history. Past Surgical History:  Procedure Laterality Date  . ANKLE SURGERY Left    cleaned up   . FRACTURE SURGERY Left    arm  . MEDIASTINOTOMY CHAMBERLAIN MCNEIL N/A 05/10/2018   Procedure: Mediastinal mass biopsy ;  Surgeon: Grace Isaac, MD;  Location: [facility] OR;  Service: Thoracic;  Laterality: N/A;  . ORIF FINGER / THUMB FRACTURE Left       Allergies: Patient has no known allergies.  Medications: Prior to Admission medications   Medication Sig Start Date End Date Taking? Authorizing Provider  diphenhydrAMINE (BENADRYL) 25 mg capsule Take 25 mg by mouth as needed.    [provider]  ondansetron (ZOFRAN) 8 MG tablet Take 1 tablet (8 mg total) by mouth 2 (two) times daily as needed for up to 20 days for nausea or vomiting. 05/31/18 06/20/18  Henreitta Leber, MD  prochlorperazine (COMPAZINE) 10 MG tablet Take 1 tablet (10 mg total) by mouth every 6 (six) hours as needed for nausea or vomiting. 05/31/18   Henreitta Leber, MD     Vital Signs: BP 126/78   Pulse (!) 104   Temp 98.7 F (37.1 C) (Oral)   Resp 18   SpO2 95%   Physical Exam awake, alert.  Chest clear to auscultation bilaterally.  Heart- slightly tachycardic but regular rhythm.  Abdomen soft, positive bowel sounds, nontender.  No lower extremity edema.  Imaging: No results found.  Labs:  CBC: Recent Labs    05/09/18 0256 05/16/18 1302  05/22/18 0740 06/03/18 0817  WBC 10.6* 14.6* 11.0* 14.6*  HGB 11.9* 13.7 12.9* 13.4  HCT 34.7* 40.7 38.5* 39.4  PLT 282 435* 449* 278    COAGS: Recent Labs    05/10/18 1153 05/22/18 0740 06/03/18 0817  INR 1.23 1.11 1.10  APTT 32 29  --     BMP: Recent Labs    05/07/18 1723 05/08/18 0226 05/09/18 0256 05/16/18 1302  NA 137 137 135 139  K 3.9 3.4* 3.5 4.3  CL 103 105 105 101  CO2 '25 26 24 28  ' GLUCOSE 82 145* 93 82  BUN '11 11 12 12  ' CALCIUM 9.5 9.2 8.9 10.1  CREATININE 1.07 1.08 0.89 1.03  GFRNONAA >60 >60 >60 >60  GFRAA >60 >60 >60 >60    LIVER FUNCTION TESTS: Recent Labs    05/08/18 0226 05/16/18 1302  BILITOT 1.0 0.5  AST 14* 19  ALT 13 33  ALKPHOS 76 111  PROT 7.7 9.6*  ALBUMIN 3.5 4.1    Assessment and Plan: Pt with  history of newly diagnosed Hodgkin's lymphoma ; presents today for Port-A-Cath placement for chemotherapy.  Risks and benefits of image guided port-a-catheter placement was discussed with the patient/sister including, but not limited to bleeding, infection, pneumothorax, or fibrin sheath development and need for additional procedures.  All of the patient's questions were answered, patient is agreeable to proceed. Consent signed and in chart.     Electronically Signed: D. Rowe Robert, PA-C 06/03/2018,  8:55 AM   I spent a total of 20 minutes at the the patient's bedside AND on the patient's hospital floor or unit, greater than 50% of which was counseling/coordinating care for port a cath placement

## 2018-06-03 NOTE — Sedation Documentation (Signed)
Patient is resting comfortably with eyes closed in NAD. 

## 2018-06-03 NOTE — Telephone Encounter (Signed)
Appointment for 11/19 was already scheduled.

## 2018-06-03 NOTE — Procedures (Signed)
Port placement, tip at SVC/RA junction.  See full report in Imaging.  Minimal blood loss and no immediate complication.

## 2018-06-04 ENCOUNTER — Other Ambulatory Visit: Payer: Self-pay | Admitting: *Deleted

## 2018-06-04 ENCOUNTER — Inpatient Hospital Stay: Payer: BLUE CROSS/BLUE SHIELD

## 2018-06-04 MED ORDER — LIDOCAINE-PRILOCAINE 2.5-2.5 % EX CREA: 1.0000 "application " | TOPICAL_CREAM | CUTANEOUS | 0 refills | Status: AC | PRN

## 2018-06-05 ENCOUNTER — Inpatient Hospital Stay: Payer: BLUE CROSS/BLUE SHIELD

## 2018-06-05 ENCOUNTER — Other Ambulatory Visit: Payer: Self-pay | Admitting: Hematology and Oncology

## 2018-06-05 ENCOUNTER — Other Ambulatory Visit: Payer: Self-pay | Admitting: Medical

## 2018-06-05 VITALS — BP 110/67 | HR 99 | Temp 99.2°F | Resp 16

## 2018-06-05 DIAGNOSIS — C8110 Nodular sclerosis classical Hodgkin lymphoma, unspecified site: Secondary | ICD-10-CM

## 2018-06-05 DIAGNOSIS — D473 Essential (hemorrhagic) thrombocythemia: Secondary | ICD-10-CM

## 2018-06-05 DIAGNOSIS — D75839 Thrombocytosis, unspecified: Secondary | ICD-10-CM

## 2018-06-05 DIAGNOSIS — J9859 Other diseases of mediastinum, not elsewhere classified: Secondary | ICD-10-CM

## 2018-06-05 DIAGNOSIS — L0291 Cutaneous abscess, unspecified: Secondary | ICD-10-CM

## 2018-06-05 DIAGNOSIS — R718 Other abnormality of red blood cells: Secondary | ICD-10-CM

## 2018-06-05 DIAGNOSIS — C8190 Hodgkin lymphoma, unspecified, unspecified site: Secondary | ICD-10-CM | POA: Insufficient documentation

## 2018-06-05 DIAGNOSIS — Z5111 Encounter for antineoplastic chemotherapy: Secondary | ICD-10-CM | POA: Diagnosis not present

## 2018-06-05 DIAGNOSIS — C8118 Nodular sclerosis classical Hodgkin lymphoma, lymph nodes of multiple sites: Secondary | ICD-10-CM

## 2018-06-05 DIAGNOSIS — Z95828 Presence of other vascular implants and grafts: Secondary | ICD-10-CM

## 2018-06-05 LAB — COMPREHENSIVE METABOLIC PANEL
ALT: 16 U/L (ref 0–44)
AST: 14 U/L — AB (ref 15–41)
Albumin: 3.8 g/dL (ref 3.5–5.0)
Alkaline Phosphatase: 85 U/L (ref 38–126)
Anion gap: 7 (ref 5–15)
BUN: 9 mg/dL (ref 6–20)
CHLORIDE: 103 mmol/L (ref 98–111)
CO2: 27 mmol/L (ref 22–32)
CREATININE: 0.99 mg/dL (ref 0.61–1.24)
Calcium: 9.7 mg/dL (ref 8.9–10.3)
GFR calc Af Amer: >60 mL/min (ref >60)
GFR calc non Af Amer: >60 mL/min (ref >60)
Glucose, Bld: 89 mg/dL (ref 70–99)
POTASSIUM: 4.5 mmol/L (ref 3.5–5.1)
SODIUM: 137 mmol/L (ref 135–145)
Total Bilirubin: 0.8 mg/dL (ref 0.3–1.2)
Total Protein: 8.3 g/dL — ABNORMAL HIGH (ref 6.5–8.1)

## 2018-06-05 LAB — IRON AND TIBC
Iron: 27 ug/dL — ABNORMAL LOW (ref 42–163)
SATURATION RATIOS: 10 % — AB (ref 20–55)
TIBC: 278 ug/dL (ref 202–409)
UIBC: 250 ug/dL (ref 117–376)

## 2018-06-05 LAB — FERRITIN: Ferritin: 197 ng/mL (ref 24–336)

## 2018-06-05 LAB — SEDIMENTATION RATE: SED RATE: 24 mm/h — AB (ref 0–16)

## 2018-06-05 MED ORDER — PALONOSETRON HCL INJECTION 0.25 MG/5ML
0.2500 mg | Freq: Once | INTRAVENOUS | Status: AC
  Administered 2018-06-05: 0.25 mg via INTRAVENOUS

## 2018-06-05 MED ORDER — HEPARIN SOD (PORK) LOCK FLUSH 100 UNIT/ML IV SOLN
500.0000 [IU] | Freq: Once | INTRAVENOUS | Status: AC | PRN
  Administered 2018-06-05: 500 [IU]
  Filled 2018-06-05: qty 5

## 2018-06-05 MED ORDER — SODIUM CHLORIDE 0.9 % IV SOLN
Freq: Once | INTRAVENOUS | Status: AC
  Administered 2018-06-05: 14:00:00 via INTRAVENOUS
  Filled 2018-06-05: qty 5

## 2018-06-05 MED ORDER — VINBLASTINE SULFATE CHEMO INJECTION 1 MG/ML
6.0500 mg/m2 | Freq: Once | INTRAVENOUS | Status: AC
  Administered 2018-06-05: 12 mg via INTRAVENOUS
  Filled 2018-06-05: qty 12

## 2018-06-05 MED ORDER — SODIUM CHLORIDE 0.9 % IV SOLN
10.0000 [IU]/m2 | Freq: Once | INTRAVENOUS | Status: AC
  Administered 2018-06-05: 20 [IU] via INTRAVENOUS
  Filled 2018-06-05: qty 6.67

## 2018-06-05 MED ORDER — SODIUM CHLORIDE 0.9 % IV SOLN
375.0000 mg/m2 | Freq: Once | INTRAVENOUS | Status: AC
  Administered 2018-06-05: 750 mg via INTRAVENOUS
  Filled 2018-06-05: qty 75

## 2018-06-05 MED ORDER — DOXORUBICIN HCL CHEMO IV INJECTION 2 MG/ML
25.0000 mg/m2 | Freq: Once | INTRAVENOUS | Status: AC
  Administered 2018-06-05: 50 mg via INTRAVENOUS
  Filled 2018-06-05: qty 25

## 2018-06-05 MED ORDER — SODIUM CHLORIDE 0.9 % IV SOLN
Freq: Once | INTRAVENOUS | Status: AC
  Administered 2018-06-05: 13:00:00 via INTRAVENOUS
  Filled 2018-06-05: qty 250

## 2018-06-05 MED ORDER — PALONOSETRON HCL INJECTION 0.25 MG/5ML
INTRAVENOUS | Status: AC
  Filled 2018-06-05: qty 5

## 2018-06-05 MED ORDER — SODIUM CHLORIDE 0.9% FLUSH
10.0000 mL | INTRAVENOUS | Status: DC | PRN
  Administered 2018-06-05: 10 mL
  Filled 2018-06-05: qty 10

## 2018-06-05 MED ORDER — SODIUM CHLORIDE 0.9% FLUSH
10.0000 mL | INTRAVENOUS | Status: DC | PRN
  Administered 2018-06-05: 10 mL via INTRAVENOUS
  Filled 2018-06-05: qty 10

## 2018-06-05 MED ORDER — MUPIROCIN 2 % EX OINT: 1.0000 "application " | TOPICAL_OINTMENT | Freq: Three times a day (TID) | CUTANEOUS | 1 refills | Status: DC

## 2018-06-05 NOTE — Progress Notes (Signed)
Ok to release tx without CMP today per Dr. Truddie Coco.

## 2018-06-05 NOTE — Patient Instructions (Signed)
[facility] Discharge Instructions for Patients Receiving Chemotherapy  Today you received the following chemotherapy agents: Adriamycin, vinblastine, bleomycin, DTIC.  To help prevent nausea and vomiting after your treatment, we encourage you to take your nausea medication as prescribed.   If you develop nausea and vomiting that is not controlled by your nausea medication, call the clinic.   BELOW ARE SYMPTOMS THAT SHOULD BE REPORTED IMMEDIATELY:  *FEVER GREATER THAN 100.5 F  *CHILLS WITH OR WITHOUT FEVER  NAUSEA AND VOMITING THAT IS NOT CONTROLLED WITH YOUR NAUSEA MEDICATION  *UNUSUAL SHORTNESS OF BREATH  *UNUSUAL BRUISING OR BLEEDING  TENDERNESS IN MOUTH AND THROAT WITH OR WITHOUT PRESENCE OF ULCERS  *URINARY PROBLEMS  *BOWEL PROBLEMS  UNUSUAL RASH Items with * indicate a potential emergency and should be followed up as soon as possible.  Feel free to call the clinic should you have any questions or concerns. The clinic phone number is [phone].  Please show the [facility] at check-in to the Emergency Department and triage nurse.   Doxorubicin injection (Adriamycin) What is this medicine? DOXORUBICIN (dox oh ROO bi sin) is a chemotherapy drug. It is used to treat many kinds of cancer like leukemia, lymphoma, neuroblastoma, sarcoma, and Wilms' tumor. It is also used to treat bladder cancer, breast cancer, lung cancer, ovarian cancer, stomach cancer, and thyroid cancer. This medicine may be used for other purposes; ask your health care provider or pharmacist if you have questions. COMMON BRAND NAME(S): Adriamycin, Adriamycin PFS, Adriamycin RDF, Rubex What should I tell my health care provider before I take this medicine? They need to know if you have any of these conditions: -heart disease -history of low blood counts caused by a medicine -liver disease -recent or ongoing radiation therapy -an unusual or allergic reaction to doxorubicin, other  chemotherapy agents, other medicines, foods, dyes, or preservatives -pregnant or trying to get pregnant -breast-feeding How should I use this medicine? This drug is given as an infusion into a vein. It is administered in a hospital or clinic by a specially trained health care professional. If you have pain, swelling, burning or any unusual feeling around the site of your injection, tell your health care professional right away. Talk to your pediatrician regarding the use of this medicine in children. Special care may be needed. Overdosage: If you think you have taken too much of this medicine contact a poison control center or emergency room at once. NOTE: This medicine is only for you. Do not share this medicine with others. What if I miss a dose? It is important not to miss your dose. Call your doctor or health care professional if you are unable to keep an appointment. What may interact with this medicine? This medicine may interact with the following medications: -6-mercaptopurine -paclitaxel -phenytoin -St. John's Wort -trastuzumab -verapamil This list may not describe all possible interactions. Give your health care provider a list of all the medicines, herbs, non-prescription drugs, or dietary supplements you use. Also tell them if you smoke, drink alcohol, or use illegal drugs. Some items may interact with your medicine. What should I watch for while using this medicine? This drug may make you feel generally unwell. This is not uncommon, as chemotherapy can affect healthy cells as well as cancer cells. Report any side effects. Continue your course of treatment even though you feel ill unless your doctor tells you to stop. There is a maximum amount of this medicine you should receive throughout your life. The  amount depends on the medical condition being treated and your overall health. Your doctor will watch how much of this medicine you receive in your lifetime. Tell your doctor if you  have taken this medicine before. You may need blood work done while you are taking this medicine. Your urine may turn red for a few days after your dose. This is not blood. If your urine is dark or brown, call your doctor. In some cases, you may be given additional medicines to help with side effects. Follow all directions for their use. Call your doctor or health care professional for advice if you get a fever, chills or sore throat, or other symptoms of a cold or flu. Do not treat yourself. This drug decreases your body's ability to fight infections. Try to avoid being around people who are sick. This medicine may increase your risk to bruise or bleed. Call your doctor or health care professional if you notice any unusual bleeding. Talk to your doctor about your risk of cancer. You may be more at risk for certain types of cancers if you take this medicine. Do not become pregnant while taking this medicine or for 6 months after stopping it. Women should inform their doctor if they wish to become pregnant or think they might be pregnant. Men should not father a child while taking this medicine and for 6 months after stopping it. There is a potential for serious side effects to an unborn child. Talk to your health care professional or pharmacist for more information. Do not breast-feed an infant while taking this medicine. This medicine has caused ovarian failure in some women and reduced sperm counts in some men This medicine may interfere with the ability to have a child. Talk with your doctor or health care professional if you are concerned about your fertility. What side effects may I notice from receiving this medicine? Side effects that you should report to your doctor or health care professional as soon as possible: -allergic reactions like skin rash, itching or hives, swelling of the face, lips, or tongue -breathing problems -chest pain -fast or irregular heartbeat -low blood counts - this  medicine may decrease the number of white blood cells, red blood cells and platelets. You may be at increased risk for infections and bleeding. -pain, redness, or irritation at site where injected -signs of infection - fever or chills, cough, sore throat, pain or difficulty passing urine -signs of decreased platelets or bleeding - bruising, pinpoint red spots on the skin, black, tarry stools, blood in the urine -swelling of the ankles, feet, hands -tiredness -weakness Side effects that usually do not require medical attention (report to your doctor or health care professional if they continue or are bothersome): -diarrhea -hair loss -mouth sores -nail discoloration or damage -nausea -red colored urine -vomiting This list may not describe all possible side effects. Call your doctor for medical advice about side effects. You may report side effects to FDA at 1-800-FDA-1088. Where should I keep my medicine? This drug is given in a hospital or clinic and will not be stored at home. NOTE: This sheet is a summary. It may not cover all possible information. If you have questions about this medicine, talk to your doctor, pharmacist, or health care provider.  2018 Elsevier/Gold Standard (2015-09-06 11:28:51)  Vinblastine injection What is this medicine? VINBLASTINE (vin BLAS teen) is a chemotherapy drug. It slows the growth of cancer cells. This medicine is used to treat many types of cancer like  breast cancer, testicular cancer, Hodgkin's disease, non-Hodgkin's lymphoma, and sarcoma. This medicine may be used for other purposes; ask your health care provider or pharmacist if you have questions. COMMON BRAND NAME(S): Velban What should I tell my health care provider before I take this medicine? They need to know if you have any of these conditions: -blood disorders -dental disease -gout -infection (especially a virus infection such as chickenpox, cold sores, or herpes) -liver disease -lung  disease -nervous system disease -recent or ongoing radiation therapy -an unusual or allergic reaction to vinblastine, other chemotherapy agents, other medicines, foods, dyes, or preservatives -pregnant or trying to get pregnant -breast-feeding How should I use this medicine? This drug is given as an infusion into a vein. It is administered in a hospital or clinic by a specially trained health care professional. If you have pain, swelling, burning or any unusual feeling around the site of your injection, tell your health care professional right away. Talk to your pediatrician regarding the use of this medicine in children. While this drug may be prescribed for selected conditions, precautions do apply. Overdosage: If you think you have taken too much of this medicine contact a poison control center or emergency room at once. NOTE: This medicine is only for you. Do not share this medicine with others. What if I miss a dose? It is important not to miss your dose. Call your doctor or health care professional if you are unable to keep an appointment. What may interact with this medicine? Do not take this medicine with any of the following medications: -erythromycin -itraconazole -mibefradil -voriconazole This medicine may also interact with the following medications: -cyclosporine -fluconazole -ketoconazole -medicines for seizures like phenytoin -medicines to increase blood counts like filgrastim, pegfilgrastim, sargramostim -vaccines -verapamil Talk to your doctor or health care professional before taking any of these medicines: -acetaminophen -aspirin -ibuprofen -ketoprofen -naproxen This list may not describe all possible interactions. Give your health care provider a list of all the medicines, herbs, non-prescription drugs, or dietary supplements you use. Also tell them if you smoke, drink alcohol, or use illegal drugs. Some items may interact with your medicine. What should I watch  for while using this medicine? Your condition will be monitored carefully while you are receiving this medicine. You will need important blood work done while you are taking this medicine. This drug may make you feel generally unwell. This is not uncommon, as chemotherapy can affect healthy cells as well as cancer cells. Report any side effects. Continue your course of treatment even though you feel ill unless your doctor tells you to stop. In some cases, you may be given additional medicines to help with side effects. Follow all directions for their use. Call your doctor or health care professional for advice if you get a fever, chills or sore throat, or other symptoms of a cold or flu. Do not treat yourself. This drug decreases your body's ability to fight infections. Try to avoid being around people who are sick. This medicine may increase your risk to bruise or bleed. Call your doctor or health care professional if you notice any unusual bleeding. Be careful brushing and flossing your teeth or using a toothpick because you may get an infection or bleed more easily. If you have any dental work done, tell your dentist you are receiving this medicine. Avoid taking products that contain aspirin, acetaminophen, ibuprofen, naproxen, or ketoprofen unless instructed by your doctor. These medicines may hide a fever. Do not become pregnant while  taking this medicine. Women should inform their doctor if they wish to become pregnant or think they might be pregnant. There is a potential for serious side effects to an unborn child. Talk to your health care professional or pharmacist for more information. Do not breast-feed an infant while taking this medicine. Men may have a lower sperm count while taking this medicine. Talk to your doctor if you plan to father a child. What side effects may I notice from receiving this medicine? Side effects that you should report to your doctor or health care professional as soon  as possible: -allergic reactions like skin rash, itching or hives, swelling of the face, lips, or tongue -low blood counts - This drug may decrease the number of white blood cells, red blood cells and platelets. You may be at increased risk for infections and bleeding. -signs of infection - fever or chills, cough, sore throat, pain or difficulty passing urine -signs of decreased platelets or bleeding - bruising, pinpoint red spots on the skin, black, tarry stools, nosebleeds -signs of decreased red blood cells - unusually weak or tired, fainting spells, lightheadedness -breathing problems -changes in hearing -change in the amount of urine -chest pain -high blood pressure -mouth sores -nausea and vomiting -pain, swelling, redness or irritation at the injection site -pain, tingling, numbness in the hands or feet -problems with balance, dizziness -seizures Side effects that usually do not require medical attention (report to your doctor or health care professional if they continue or are bothersome): -constipation -hair loss -jaw pain -loss of appetite -sensitivity to light -stomach pain -tumor pain This list may not describe all possible side effects. Call your doctor for medical advice about side effects. You may report side effects to FDA at 1-800-FDA-1088. Where should I keep my medicine? This drug is given in a hospital or clinic and will not be stored at home. NOTE: This sheet is a summary. It may not cover all possible information. If you have questions about this medicine, talk to your doctor, pharmacist, or health care provider.  2018 Elsevier/Gold Standard (2008-04-06 17:15:59)   Bleomycin injection What is this medicine? BLEOMYCIN (blee oh MYE sin) is a chemotherapy drug. It is used to treat many kinds of cancer like lymphoma, cervical cancer, head and neck cancer, and testicular cancer. It is also used to prevent and to treat fluid build-up around the lungs caused by some  cancers. This medicine may be used for other purposes; ask your health care provider or pharmacist if you have questions. COMMON BRAND NAME(S): Blenoxane What should I tell my health care provider before I take this medicine? They need to know if you have any of these conditions: -cigarette smoker -kidney disease -lung disease -recent or ongoing radiation therapy -an unusual or allergic reaction to bleomycin, other chemotherapy agents, other medicines, foods, dyes, or preservatives -pregnant or trying to get pregnant -breast-feeding How should I use this medicine? This drug is given as an infusion into a vein or a body cavity. It can also be given as an injection into a muscle or under the skin. It is administered in a hospital or clinic by a specially trained health care professional. Talk to your pediatrician regarding the use of this medicine in children. Special care may be needed. Overdosage: If you think you have taken too much of this medicine contact a poison control center or emergency room at once. NOTE: This medicine is only for you. Do not share this medicine with others. What if  I miss a dose? It is important not to miss your dose. Call your doctor or health care professional if you are unable to keep an appointment. What may interact with this medicine? -certain antibiotics given by injection -cisplatin -cyclosporine -diuretics -foscarnet -medicines to increase blood counts like filgrastim, pegfilgrastim, sargramostim -vaccines This list may not describe all possible interactions. Give your health care provider a list of all the medicines, herbs, non-prescription drugs, or dietary supplements you use. Also tell them if you smoke, drink alcohol, or use illegal drugs. Some items may interact with your medicine. What should I watch for while using this medicine? Visit your doctor for checks on your progress. This drug may make you feel generally unwell. This is not uncommon,  as chemotherapy can affect healthy cells as well as cancer cells. Report any side effects. Continue your course of treatment even though you feel ill unless your doctor tells you to stop. Call your doctor or health care professional for advice if you get a fever, chills or sore throat, or other symptoms of a cold or flu. Do not treat yourself. This drug decreases your body's ability to fight infections. Try to avoid being around people who are sick. Avoid taking products that contain aspirin, acetaminophen, ibuprofen, naproxen, or ketoprofen unless instructed by your doctor. These medicines may hide a fever. Do not become pregnant while taking this medicine. Women should inform their doctor if they wish to become pregnant or think they might be pregnant. There is a potential for serious side effects to an unborn child. Talk to your health care professional or pharmacist for more information. Do not breast-feed an infant while taking this medicine. There is a maximum amount of this medicine you should receive throughout your life. The amount depends on the medical condition being treated and your overall health. Your doctor will watch how much of this medicine you receive in your lifetime. Tell your doctor if you have taken this medicine before. What side effects may I notice from receiving this medicine? Side effects that you should report to your doctor or health care professional as soon as possible: -allergic reactions like skin rash, itching or hives, swelling of the face, lips, or tongue -breathing problems -chest pain -confusion -cough -fast, irregular heartbeat -feeling faint or lightheaded, falls -fever or chills -mouth sores -pain, tingling, numbness in the hands or feet -trouble passing urine or change in the amount of urine -yellowing of the eyes or skin Side effects that usually do not require medical attention (report to your doctor or health care professional if they continue or are  bothersome): -darker skin color -hair loss -irritation at site where injected -loss of appetite -nail changes -nausea and vomiting -weight loss This list may not describe all possible side effects. Call your doctor for medical advice about side effects. You may report side effects to FDA at 1-800-FDA-1088. Where should I keep my medicine? This drug is given in a hospital or clinic and will not be stored at home. NOTE: This sheet is a summary. It may not cover all possible information. If you have questions about this medicine, talk to your doctor, pharmacist, or health care provider.  2018 Elsevier/Gold Standard (2012-11-05 09:36:48)  Dacarbazine, DTIC injection What is this medicine? DACARBAZINE (da KAR ba zeen) is a chemotherapy drug. This medicine is used to treat skin cancer. It is also used with other medicines to treat Hodgkin's disease. This medicine may be used for other purposes; ask your health care provider  or pharmacist if you have questions. COMMON BRAND NAME(S): DTIC-Dome What should I tell my health care provider before I take this medicine? They need to know if you have any of these conditions: -infection (especially virus infection such as chickenpox, cold sores, or herpes) -kidney disease -liver disease -low blood counts like low platelets, red blood cells, white blood cells -recent radiation therapy -an unusual or allergic reaction to dacarbazine, other chemotherapy agents, other medicines, foods, dyes, or preservatives -pregnant or trying to get pregnant -breast-feeding How should I use this medicine? This drug is given as an injection or infusion into a vein. It is administered in a hospital or clinic by a specially trained health care professional. Talk to your pediatrician regarding the use of this medicine in children. While this drug may be prescribed for selected conditions, precautions do apply. Overdosage: If you think you have taken too much of this  medicine contact a poison control center or emergency room at once. NOTE: This medicine is only for you. Do not share this medicine with others. What if I miss a dose? It is important not to miss your dose. Call your doctor or health care professional if you are unable to keep an appointment. What may interact with this medicine? -medicines to increase blood counts like filgrastim, pegfilgrastim, sargramostim -vaccines This list may not describe all possible interactions. Give your health care provider a list of all the medicines, herbs, non-prescription drugs, or dietary supplements you use. Also tell them if you smoke, drink alcohol, or use illegal drugs. Some items may interact with your medicine. What should I watch for while using this medicine? Your condition will be monitored carefully while you are receiving this medicine. You will need important blood work done while you are taking this medicine. This drug may make you feel generally unwell. This is not uncommon, as chemotherapy can affect healthy cells as well as cancer cells. Report any side effects. Continue your course of treatment even though you feel ill unless your doctor tells you to stop. Call your doctor or health care professional for advice if you get a fever, chills or sore throat, or other symptoms of a cold or flu. Do not treat yourself. This drug decreases your body's ability to fight infections. Try to avoid being around people who are sick. This medicine may increase your risk to bruise or bleed. Call your doctor or health care professional if you notice any unusual bleeding. Talk to your doctor about your risk of cancer. You may be more at risk for certain types of cancers if you take this medicine. Do not become pregnant while taking this medicine. Women should inform their doctor if they wish to become pregnant or think they might be pregnant. There is a potential for serious side effects to an unborn child. Talk to your  health care professional or pharmacist for more information. Do not breast-feed an infant while taking this medicine. What side effects may I notice from receiving this medicine? Side effects that you should report to your doctor or health care professional as soon as possible: -allergic reactions like skin rash, itching or hives, swelling of the face, lips, or tongue -low blood counts - this medicine may decrease the number of white blood cells, red blood cells and platelets. You may be at increased risk for infections and bleeding. -signs of infection - fever or chills, cough, sore throat, pain or difficulty passing urine -signs of decreased platelets or bleeding - bruising, pinpoint red  spots on the skin, black, tarry stools, blood in the urine -signs of decreased red blood cells - unusually weak or tired, fainting spells, lightheadedness -breathing problems -muscle pains -pain at site where injected -trouble passing urine or change in the amount of urine -vomiting -yellowing of the eyes or skin Side effects that usually do not require medical attention (report to your doctor or health care professional if they continue or are bothersome): -diarrhea -hair loss -loss of appetite -nausea -skin more sensitive to sun or ultraviolet light -stomach upset This list may not describe all possible side effects. Call your doctor for medical advice about side effects. You may report side effects to FDA at 1-800-FDA-1088. Where should I keep my medicine? This drug is given in a hospital or clinic and will not be stored at home. NOTE: This sheet is a summary. It may not cover all possible information. If you have questions about this medicine, talk to your doctor, pharmacist, or health care provider.  2018 [employer]vier/Gold Standard (2015-09-10 15:17:39)

## 2018-06-06 LAB — ERYTHROPOIETIN: ERYTHROPOIETIN: 10.4 m[IU]/mL (ref 2.6–18.5)

## 2018-06-10 ENCOUNTER — Other Ambulatory Visit: Payer: Self-pay | Admitting: *Deleted

## 2018-06-10 ENCOUNTER — Encounter: Payer: Self-pay | Admitting: Hematology

## 2018-06-10 ENCOUNTER — Telehealth: Payer: Self-pay | Admitting: *Deleted

## 2018-06-10 DIAGNOSIS — C8110 Nodular sclerosis classical Hodgkin lymphoma, unspecified site: Secondary | ICD-10-CM

## 2018-06-10 NOTE — Telephone Encounter (Signed)
FYI "We were told lab needs to be checked within seven days before receipt of chemotherapy treatments.  Does David Lam need to come in tomorrow?  When is he scheduled to receive next chemotherapy?"  Reviewed one week F/U after first chemotherapy will evaluate blood work to determine how he tolerated first treatment and how to proceed.  Next chemotherapy will be scheduled after this evaluation.   Denies further questions or needs at this time.

## 2018-06-10 NOTE — Progress Notes (Signed)
HEMATOLOGY/ONCOLOGY CONSULTATION NOTE  Date of Service: 06/11/2018  Patient Care Team: System, Pcp Not In as PCP - General  CHIEF COMPLAINTS/PURPOSE OF CONSULTATION:  Nodular sclerosis Hodgkin's Lymphoma   HISTORY OF PRESENTING ILLNESS:   David Lam is a wonderful 22 y.o. male who has been referred to [place] by Dr. 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 He is accompanied today by his mother and sister. The pt reports that he is doing well overall.   The pt presented to the ED on 05/07/18 after developing chest pain over 2 days, with positional elements. The pt was evaluated with a CXR which revealed a mediastinal mass that was biopsied on 05/10/18 and confirmed nodular sclerosis Hodgkin's lymphoma, as noted below.   The pt notes that he though his chest discomfort was due to working out, and denies noticing any swelling in the area. The pt denies any recent fevers, chills, night sweats, skin rashes, unexpected weight loss. The pt notes that his chest pain continues and is making his sleeping difficult. He denies any difficulty ambulating or SOB.   The pt reports that he has not had any prior medical problems, and has had left thumb and left ankle surgeries from injuries. 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   Of note prior to the patient's visit today, pt has had a Scalene lymph node biopsy completed on 05/10/18 with results revealing Nodular sclerosis Hodgkin's Lymphoma    The pt also had a CTA Chest on 05/07/18 which revealed Bulky, bilateral anterior superior mediastinal confluent soft tissue masses which are not calcified in appearance and which do not appear to cause occlusion or significant luminal narrowing of traversing vasculature. Leading consideration is lymphoma, possibly Hodgkin's. Given lack of differentiating soft tissue densities, a teratoma is believed less likely. Nonseminomatous germ cell tumor given presence of  small effusions might also be within differential though no pulmonary lesions are visualized. Metastatic disease, infection or inflammatory process are believed less likely.  The pt also had a CT A/P on 05/08/18 which revealed No acute finding or evidence of malignancy in the abdomen.  Most recent lab results (05/09/18) of CBC is as follows: all values are WNL except for WBC at 10.6k, HGB at 11.9, HCT at 34.7, MCV at 78.5, ANC at 7.9k, Eosinophils abs at 600. 05/08/18 Sed Rate at 19  On review of systems, pt reports chest pain, moving his bowels well, and denies skin rashes, fevers, chills, night sweats, unexpected weight loss, SOB, headaches, changes in vision, urinary pain or discomfort, abdominal pains, testicular pain or swelling, leg swelling, arm swelling, and any other symptoms.   On PMHx the pt reports left thumb and left ankle surgeries, denies blood transfusions. On Social Hx the pt denies alcohol consumption and denies smoking cigarettes or other recreational drugs. 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 Denies tattoos.  On Family Hx the pt denies any blood disorders or cancers.  He denies any medications.  Interval History:   David Lam returns today for management and evaluation of his Hodgkin's lymphoma. The patient's last visit with [place] was on 05/23/18, and the pt did see my colleague Dr. Audelia Hives in the interim. His sister is present via cell phone. The pt reports that he is doing well overall.   The pt began C1D1 ABVD on 06/05/18. He denies any vomiting or diarrhea. He notes that he tolerated the treatment well but has had some mild nausea which  has been well controlled. He denies any chest pain or chest heaviness and notes that his breathing has been good.   The pt reports that he has continued to stay active with walking and is hoping to increase his activity levels slowly and progressively. The pt notes that he has been staying very well hydrated and is eating  well.   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   He denies any problems with the port.   Lab results today (06/11/18) of CBC w/diff is as follows: all values are WNL except for MCV at 79.0, Lymphs abs at 600. 06/11/18 CMP is pending  06/11/18 Ferritin is pending 06/11/18 Iron and TIBC is pending 06/11/18 Sed Rate is pending   On review of systems, pt reports eating well, well controlled mild nausea, fingertip numbness, and denies vomiting, diarrhea, abdominal pains, mouth sores, chest pain, chest heaviness, and any other symptoms.   MEDICAL HISTORY:  No past medical history on file.  SURGICAL HISTORY: Past Surgical History:  Procedure Laterality Date  . ANKLE SURGERY Left    cleaned up   . FRACTURE SURGERY Left    arm  . IR IMAGING GUIDED PORT INSERTION  06/03/2018  . MEDIASTINOTOMY CHAMBERLAIN MCNEIL N/A 05/10/2018   Procedure: Mediastinal mass biopsy ;  Surgeon: Grace Isaac, MD;  Location: [facility] OR;  Service: Thoracic;  Laterality: N/A;  . ORIF FINGER / THUMB FRACTURE Left     SOCIAL HISTORY: Social History   Socioeconomic History  . Marital status: Unknown    Spouse name: Not on file  . Number of children: Not on file  . Years of education: Not on file  . Highest education level: Not on file  Occupational History  . Not on file  Social Needs  . Financial resource strain: Not on file  . Food insecurity:    Worry: Not on file    Inability: Not on file  . Transportation needs:    Medical: Not on file    Non-medical: Not on file  Tobacco Use  . Smoking status: Never Smoker  . Smokeless tobacco: Never Used  Substance and Sexual Activity  . Alcohol use: Never    Frequency: Never  . Drug use: Never  . Sexual activity: Not on file  Lifestyle  . Physical activity:    Days per week: Not on file    Minutes per session: Not on file  . Stress: Not on file  Relationships  . Social connections:    Talks  on phone: Not on file    Gets together: Not on file    Attends religious service: Not on file    Active member of club or organization: Not on file    Attends meetings of clubs or organizations: Not on file    Relationship status: Not on file  . Intimate partner violence:    Fear of current or ex partner: Not on file    Emotionally abused: Not on file    Physically abused: Not on file    Forced sexual activity: Not on file  Other Topics Concern  . Not on file  Social History Narrative  . Not on file    FAMILY HISTORY: No family history on file.  ALLERGIES:  has No Known Allergies.  MEDICATIONS:  Current Outpatient Medications  Medication Sig Dispense Refill  . diphenhydrAMINE (BENADRYL) 25 mg capsule Take 25 mg by mouth as needed.    Marland Kitchen  lidocaine-prilocaine (EMLA) cream Apply 1 application topically as needed. 30 g 0  . mupirocin ointment (BACTROBAN) 2 % Place 1 application into the nose 3 (three) times daily. 30 g 1  . ondansetron (ZOFRAN) 8 MG tablet Take 1 tablet (8 mg total) by mouth 2 (two) times daily as needed for up to 20 days for nausea or vomiting. 20 tablet 2  . prochlorperazine (COMPAZINE) 10 MG tablet Take 1 tablet (10 mg total) by mouth every 6 (six) hours as needed for nausea or vomiting. 30 tablet 0   No current facility-administered medications for this visit.     REVIEW OF SYSTEMS:    A 10+ POINT REVIEW OF SYSTEMS WAS OBTAINED including neurology, dermatology, psychiatry, cardiac, respiratory, lymph, extremities, GI, GU, Musculoskeletal, constitutional, breasts, reproductive, HEENT.  All pertinent positives are noted in the HPI.  All others are negative.   PHYSICAL EXAMINATION: ECOG PERFORMANCE STATUS: 1 - Symptomatic but completely ambulatory  . Vitals:   06/11/18 1147  BP: 134/81  Pulse: 95  Resp: 18  Temp: 97.8 F (36.6 C)  SpO2: 100%   Filed Weights   06/11/18 1147  Weight: 187 lb (84.8 kg)   .Body mass index is 28.43  kg/m.  GENERAL:alert, in no acute distress and comfortable SKIN: no acute rashes, no significant lesions EYES: conjunctiva are pink and non-injected, sclera anicteric OROPHARYNX: MMM, no exudates, no oropharyngeal erythema or ulceration NECK: supple, no JVD LYMPH:  no palpable lymphadenopathy in the cervical, axillary or inguinal regions LUNGS: clear to auscultation b/l with normal respiratory effort HEART: regular rate & rhythm ABDOMEN:  normoactive bowel sounds , non tender, not distended. No palpable hepatosplenomegaly.  Extremity: no pedal edema PSYCH: alert & oriented x 3 with fluent speech NEURO: no focal motor/sensory deficits   LABORATORY DATA:  I have reviewed the data as listed  . CBC Latest Ref Rng & Units 06/11/2018 06/03/2018 05/22/2018  WBC 4.0 - 10.5 K/uL 7.6 14.6(H) 11.0(H)  Hemoglobin 13.0 - 17.0 g/dL 13.2 13.4 12.9(L)  Hematocrit 39.0 - 52.0 % 39.1 39.4 38.5(L)  Platelets 150 - 400 K/uL 265 278 449(H)    . CMP Latest Ref Rng & Units 06/11/2018 06/05/2018 05/16/2018  Glucose 70 - 99 mg/dL 88 89 82  BUN 6 - 20 mg/dL _0 Creatinine 0.61 - 1.24 mg/dL 0.90 0.99 1.03  Sodium 135 - 145 mmol/L 137 137 139  Potassium 3.5 - 5.1 mmol/L 4.5 4.5 4.3  Chloride 98 - 111 mmol/L 101 103 101  CO2 22 - 32 mmol/L _1 Calcium 8.9 - 10.3 mg/dL 9.9 9.7 10.1  Total Protein 6.5 - 8.1 g/dL 8.3(H) 8.3(H) 9.6(H)  Total Bilirubin 0.3 - 1.2 mg/dL 0.5 0.8 0.5  Alkaline Phos 38 - 126 U/L 91 85 111  AST 15 - 41 U/L 14(L) 14(L) 19  ALT 0 - 44 U/L 30 16 33   05/10/18 Biopsy:   05/22/18 BM Bx:    RADIOGRAPHIC STUDIES: I have personally reviewed the radiological images as listed and agreed with the findings in the report. Nm Pet Image Initial (pi) Skull Base To Thigh  Result Date: 05/21/2018 CLINICAL DATA:  Initial treatment strategy for Nodular sclerosis hodgkin lymphoma . EXAM: NUCLEAR MEDICINE PET SKULL BASE TO THIGH TECHNIQUE: 8.82 mCi F-18 FDG was injected  intravenously. Full-ring PET imaging was performed from the skull base to thigh after the radiotracer. CT data was obtained and used for attenuation correction and anatomic localization. Fasting blood glucose: 110 mg/dl  COMPARISON:  05/08/2018 FINDINGS: Mediastinal blood pool activity: SUV max 1.99 Liver activity: SUV max 2.46. NECK: No hypermetabolic lymph nodes in the neck. Incidental CT findings: none CHEST: No hypermetabolic axillary or supraclavicular lymph nodes. Bulky, predominantly anterior mediastinal hypermetabolic adenopathy is identified. -Left-sided pre-vascular lymph node measures 3.6 x 4.2 cm with SUV max of 4.4. -right paratracheal lymph node measures 1.6 cm with  SUV max of 7.7. -right pre-vascular lymph node measures 6.3 x 4.1 cm and has an SUV max of 6.82. -low left paratracheal lymph node measures 1 cm and has an SUV max of 3.75. No hypermetabolic hilar lymph nodes. Pericardial effusion is again noted, unchanged. Incidental CT findings: No pleural effusion. No airspace consolidation. ABDOMEN/PELVIS: No abnormal uptake identified within the liver, pancreas, or spleen. No abnormal uptake within the adrenal glands. No hypermetabolic lymph nodes within the abdomen or pelvis. No hypermetabolic inguinal lymph nodes. The spleen appears normal in size without abnormal uptake. Incidental CT findings: none SKELETON: No focal hypermetabolic activity to suggest skeletal metastasis. Incidental CT findings: none IMPRESSION: 1. Hypermetabolic mediastinal adenopathy is identified compatible with history of lymphoma. No hypermetabolic lymph nodes within the neck, abdomen or pelvis. Electronically Signed   By: Kerby Moors M.D.   On: 05/21/2018 15:25   Ct Biopsy  Result Date: 05/22/2018 INDICATION: 22 year old male with a history of Hodgkin's lymphoma EXAM: CT-GUIDED BONE MARROW BIOPSY MEDICATIONS: None. ANESTHESIA/SEDATION: Moderate (conscious) sedation was employed during this procedure. A total of Versed  3.0 mg and Fentanyl 100 mcg was administered intravenously. Moderate Sedation Time: 10 minutes. The patient's level of consciousness and vital signs were monitored continuously by radiology nursing throughout the procedure under my direct supervision. FLUOROSCOPY TIME:  CT COMPLICATIONS: None PROCEDURE: The procedure risks, benefits, and alternatives were explained to the patient. Questions regarding the procedure were encouraged and answered. The patient understands and consents to the procedure. Scout CT of the pelvis was performed for surgical planning purposes. The posterior pelvis was prepped with Chlorhexidine in a sterile fashion, and a sterile drape was applied covering the operative field. A sterile gown and sterile gloves were used for the procedure. Local anesthesia was provided with 1% Lidocaine. Posterior iliac bone was targeted for biopsy. The skin and subcutaneous tissues were infiltrated with 1% lidocaine without epinephrine. A small stab incision was made with an 11 blade scalpel, and an 11 gauge Murphy needle was advanced with CT guidance to the posterior cortex. Manual forced was used to advance the needle through the posterior cortex and the stylet was removed. A bone marrow aspirate was retrieved and passed to a cytotechnologist in the room. The Murphy needle was then advanced without the stylet for a core biopsy. The core biopsy was retrieved and also passed to a cytotechnologist. Manual pressure was used for hemostasis and a sterile dressing was placed. No complications were encountered no significant blood loss was encountered. Patient tolerated the procedure well and remained hemodynamically stable throughout. IMPRESSION: Status post CT-guided bone marrow biopsy, with tissue specimen sent to pathology for complete histopathologic analysis Signed, Dulcy Fanny. Earleen Newport, DO [facility] Electronically Signed   By: Corrie Mckusick D.O.   On:  05/22/2018 11:10   Ct Bone Marrow Biopsy & Aspiration  Result Date: 05/22/2018 INDICATION: 22 year old male with a history of Hodgkin's lymphoma EXAM: CT-GUIDED BONE MARROW BIOPSY MEDICATIONS: None. ANESTHESIA/SEDATION: Moderate (conscious) sedation was employed during this procedure. A total of Versed 3.0 mg and Fentanyl 100 mcg was administered intravenously. Moderate  Sedation Time: 10 minutes. The patient's level of consciousness and vital signs were monitored continuously by radiology nursing throughout the procedure under my direct supervision. FLUOROSCOPY TIME:  CT COMPLICATIONS: None PROCEDURE: The procedure risks, benefits, and alternatives were explained to the patient. Questions regarding the procedure were encouraged and answered. The patient understands and consents to the procedure. Scout CT of the pelvis was performed for surgical planning purposes. The posterior pelvis was prepped with Chlorhexidine in a sterile fashion, and a sterile drape was applied covering the operative field. A sterile gown and sterile gloves were used for the procedure. Local anesthesia was provided with 1% Lidocaine. Posterior iliac bone was targeted for biopsy. The skin and subcutaneous tissues were infiltrated with 1% lidocaine without epinephrine. A small stab incision was made with an 11 blade scalpel, and an 11 gauge Murphy needle was advanced with CT guidance to the posterior cortex. Manual forced was used to advance the needle through the posterior cortex and the stylet was removed. A bone marrow aspirate was retrieved and passed to a cytotechnologist in the room. The Murphy needle was then advanced without the stylet for a core biopsy. The core biopsy was retrieved and also passed to a cytotechnologist. Manual pressure was used for hemostasis and a sterile dressing was placed. No complications were encountered no significant blood loss was encountered. Patient tolerated the procedure well and remained  hemodynamically stable throughout. IMPRESSION: Status post CT-guided bone marrow biopsy, with tissue specimen sent to pathology for complete histopathologic analysis Signed, Dulcy Fanny. Earleen Newport, DO [facility] Electronically Signed   By: Corrie Mckusick D.O.   On: 05/22/2018 11:10   Ir Imaging Guided Port Insertion  Result Date: 06/03/2018 INDICATION: 22 year old with nodular sclerosis Hodgkin's lymphoma. Port-A-Cath needed for treatment. EXAM: FLUOROSCOPIC AND ULTRASOUND GUIDED PLACEMENT OF A SUBCUTANEOUS PORT COMPARISON:  None. MEDICATIONS: Ancef 2 g; The antibiotic was administered within an appropriate time interval prior to skin puncture. ANESTHESIA/SEDATION: Versed 4 mg IV; Fentanyl 100 mcg IV; Moderate Sedation Time:  35 minutes The patient was continuously monitored during the procedure by the interventional radiology nurse under my direct supervision. FLUOROSCOPY TIME:  30 seconds, 5 mGy COMPLICATIONS: None immediate. PROCEDURE: The procedure, risks, benefits, and alternatives were explained to the patient. Questions regarding the procedure were encouraged and answered. The patient understands and consents to the procedure. Patient was placed supine on the interventional table. Ultrasound confirmed a patent right internal jugular vein. The right chest and neck were cleaned with a skin antiseptic and a sterile drape was placed. Maximal barrier sterile technique was utilized including caps, mask, sterile gowns, sterile gloves, sterile drape, hand hygiene and skin antiseptic. The right neck was anesthetized with 1% lidocaine. Small incision was made in the right neck with a blade. Micropuncture set was placed in the right internal jugular vein with ultrasound guidance. The micropuncture wire was used for measurement purposes. The right chest was anesthetized with 1% lidocaine with epinephrine. #15 blade was used to make an incision and a subcutaneous  port pocket was formed. [place] was assembled. Subcutaneous tunnel was formed with a stiff tunneling device. The port catheter was brought through the subcutaneous tunnel. The port was placed in the subcutaneous pocket. The micropuncture set was exchanged for a peel-away sheath. The catheter was placed through the peel-away sheath and the tip was positioned at the SVC and right atrium junction. Catheter placement was confirmed with fluoroscopy. The port was accessed and flushed with  heparinized saline. The port pocket was closed using two layers of absorbable sutures and Dermabond. The vein skin site was closed using a single layer of absorbable suture and Dermabond. Sterile dressings were applied. Patient tolerated the procedure well without an immediate complication. Ultrasound and fluoroscopic images were taken and saved for this procedure. IMPRESSION: Placement of a subcutaneous CT injectable port device. Electronically Signed   By: Markus Daft M.D.   On: 06/03/2018 15:36    ASSESSMENT & PLAN:   22 y.o. male with  1. Nodular sclerosis Hodgkin's Lymphoma  05/07/18 CTA Chest revealed Bulky, bilateral anterior superior mediastinal confluent soft tissue masses which are not calcified in appearance and which do not appear to cause occlusion or significant luminal narrowing of traversing vasculature. Leading consideration is lymphoma, possibly Hodgkin's. Given lack of differentiating soft tissue densities, a teratoma is believed less likely. Nonseminomatous germ cell tumor given presence of small effusions might also be within differential though no pulmonary lesions are visualized. Metastatic disease, infection or inflammatory process are believed less likely.    05/08/18 CT A/P revealed No acute finding or evidence of malignancy in the abdomen.  05/08/18 ECHO revealed LV EF of 55-60%   05/10/18 Scalene lymph node Biopsy revealed Nodular sclerosis Hodgkin's Lymphoma   05/08/18 ECHO  revealed LV EF of 55-60%  05/16/18 Hep B, Hep C, and HIV labs all negative   05/21/18 PET/CT revealed Hypermetabolic mediastinal adenopathy is identified compatible with history of lymphoma. No hypermetabolic lymph nodes within the neck, abdomen or pelvis.  05/22/18 BM Bx was negative   PLAN:  -Discussed the risk of infertility that can result from treatment, and the recommendation for sperm banking. Provided contact for [place] [facility]. -Discussed that the staging indicated so far is a Stage I/II bulky  -Intend to treat with ABVD for 2 cycles followed by repeat PET/CT and if responsive, AVD for up to 4 cycles +/- ISRT -Discussed pt labwork today, 06/11/18; blood counts are stable, normal WBC, normal HGB and normal PLT -Discussed the 05/29/18 PFT was not prohibitive  -The pt has no prohibitive toxicities from continuing C1 ABVD at this time. C1D15 treatment will be on 06/19/18  -Avoid very stressful isometric exercises, but aerobic exercises are fine to continue -Avoid uncooked or raw foods, and recommended avoiding crowds and individuals with infections -Begin Vitamin B complex -Recommend salt and baking soda mouthwashes    Please schedule next 4 treatment q15days as per orders Port flush and labs with each treatment RTC with Dr Irene Limbo on 11/27 with next treatment   All of the patients questions were answered with apparent satisfaction. The patient knows to call the clinic with any problems, questions or concerns.  The total time spent in the appt was 30 minutes and more than 50% was on counseling and direct patient cares.    Sullivan Lone MD MS AAHIVMS [facility] Hematology/Oncology Physician [facility]  (Office):       [phone] (Work cell):  [phone] (Fax):           [phone]  06/11/2018 12:38 PM  I, Baldwin Jamaica, am acting as a scribe for Dr. Sullivan Lone.   .I have reviewed the above documentation for accuracy and completeness, and I agree with  the above. [name] MD

## 2018-06-10 NOTE — Progress Notes (Signed)
Called pt to introduce myself as his Arboriculturist.  Unfortunately there aren't any foundations offering copay assistance for his Dx but I offered the [employer], went over what it covers and gave him the income requirement.  He would like to discuss it with his older sibling and call me back.  If he would like to apply we will meet on 06/11/18.  I will also give him my card for any questions or concerns he may have in the future.

## 2018-06-11 ENCOUNTER — Encounter: Payer: Self-pay | Admitting: Hematology

## 2018-06-11 ENCOUNTER — Inpatient Hospital Stay (HOSPITAL_BASED_OUTPATIENT_CLINIC_OR_DEPARTMENT_OTHER): Payer: BLUE CROSS/BLUE SHIELD | Admitting: Hematology

## 2018-06-11 ENCOUNTER — Inpatient Hospital Stay: Payer: BLUE CROSS/BLUE SHIELD

## 2018-06-11 VITALS — BP 134/81 | HR 95 | Temp 97.8°F | Resp 18 | Ht 68.0 in | Wt 187.0 lb

## 2018-06-11 DIAGNOSIS — C8110 Nodular sclerosis classical Hodgkin lymphoma, unspecified site: Secondary | ICD-10-CM

## 2018-06-11 DIAGNOSIS — C8118 Nodular sclerosis classical Hodgkin lymphoma, lymph nodes of multiple sites: Secondary | ICD-10-CM | POA: Diagnosis not present

## 2018-06-11 DIAGNOSIS — Z5111 Encounter for antineoplastic chemotherapy: Secondary | ICD-10-CM | POA: Diagnosis not present

## 2018-06-11 DIAGNOSIS — Z95828 Presence of other vascular implants and grafts: Secondary | ICD-10-CM

## 2018-06-11 DIAGNOSIS — G629 Polyneuropathy, unspecified: Secondary | ICD-10-CM | POA: Diagnosis not present

## 2018-06-11 LAB — CBC WITH DIFFERENTIAL (CANCER CENTER ONLY)
Abs Immature Granulocytes: 0.06 10*3/uL (ref 0.00–0.07)
Basophils Absolute: 0 10*3/uL (ref 0.0–0.1)
Basophils Relative: 1 %
EOS PCT: 5 %
Eosinophils Absolute: 0.4 10*3/uL (ref 0.0–0.5)
HEMATOCRIT: 39.1 % (ref 39.0–52.0)
HEMOGLOBIN: 13.2 g/dL (ref 13.0–17.0)
Immature Granulocytes: 1 %
LYMPHS ABS: 0.6 10*3/uL — AB (ref 0.7–4.0)
Lymphocytes Relative: 8 %
MCH: 26.7 pg (ref 26.0–34.0)
MCHC: 33.8 g/dL (ref 30.0–36.0)
MCV: 79 fL — AB (ref 80.0–100.0)
MONO ABS: 0.2 10*3/uL (ref 0.1–1.0)
MONOS PCT: 2 %
Neutro Abs: 6.3 10*3/uL (ref 1.7–7.7)
Neutrophils Relative %: 83 %
Platelet Count: 265 10*3/uL (ref 150–400)
RBC: 4.95 MIL/uL (ref 4.22–5.81)
RDW: 15.1 % (ref 11.5–15.5)
WBC: 7.6 10*3/uL (ref 4.0–10.5)
nRBC: 0 % (ref 0.0–0.2)

## 2018-06-11 LAB — IRON AND TIBC
Iron: 45 ug/dL (ref 42–163)
Saturation Ratios: 15 % — ABNORMAL LOW (ref 20–55)
TIBC: 295 ug/dL (ref 202–409)
UIBC: 250 ug/dL (ref 117–376)

## 2018-06-11 LAB — CMP (CANCER CENTER ONLY)
ALK PHOS: 91 U/L (ref 38–126)
ALT: 30 U/L (ref 0–44)
ANION GAP: 8 (ref 5–15)
AST: 14 U/L — ABNORMAL LOW (ref 15–41)
Albumin: 4.1 g/dL (ref 3.5–5.0)
BILIRUBIN TOTAL: 0.5 mg/dL (ref 0.3–1.2)
BUN: 13 mg/dL (ref 6–20)
CALCIUM: 9.9 mg/dL (ref 8.9–10.3)
CO2: 28 mmol/L (ref 22–32)
CREATININE: 0.9 mg/dL (ref 0.61–1.24)
Chloride: 101 mmol/L (ref 98–111)
GFR, Estimated: >60 mL/min (ref >60)
Glucose, Bld: 88 mg/dL (ref 70–99)
Potassium: 4.5 mmol/L (ref 3.5–5.1)
Sodium: 137 mmol/L (ref 135–145)
TOTAL PROTEIN: 8.3 g/dL — AB (ref 6.5–8.1)

## 2018-06-11 LAB — SEDIMENTATION RATE: Sed Rate: 14 mm/h (ref 0–16)

## 2018-06-11 LAB — FERRITIN: Ferritin: 227 ng/mL (ref 24–336)

## 2018-06-11 MED ORDER — HEPARIN SOD (PORK) LOCK FLUSH 100 UNIT/ML IV SOLN
500.0000 [IU] | Freq: Once | INTRAVENOUS | Status: AC
  Administered 2018-06-11: 500 [IU] via INTRAVENOUS
  Filled 2018-06-11: qty 5

## 2018-06-11 MED ORDER — SODIUM CHLORIDE 0.9% FLUSH
10.0000 mL | INTRAVENOUS | Status: DC | PRN
  Administered 2018-06-11: 10 mL via INTRAVENOUS
  Filled 2018-06-11: qty 10

## 2018-06-11 NOTE — Patient Instructions (Signed)
Thank you for choosing [facility] to provide your oncology and hematology care.  To afford each patient quality time with our providers, please arrive 30 minutes before your scheduled appointment time.  If you arrive late for your appointment, you may be asked to reschedule.  We strive to give you quality time with our providers, and arriving late affects you and other patients whose appointments are after yours.    If you are a no show for multiple scheduled visits, you may be dismissed from the clinic at the providers discretion.     Again, thank you for choosing [facility], our hope is that these requests will decrease the amount of time that you wait before being seen by our physicians.  ______________________________________________________________________   Should you have questions after your visit to the [facility], please contact our office at [phone] between the hours of 8:30 and 4:30 p.m.    Voicemails left after 4:30p.m will not be returned until the following business day.     For prescription refill requests, please have your pharmacy contact us directly.  Please also try to allow 48 hours for prescription requests.     Please contact the scheduling department for questions regarding scheduling.  For scheduling of procedures such as PET scans, CT scans, MRI, Ultrasound, etc please contact central scheduling at [phone].     Resources For Cancer Patients and Caregivers:    Oncolink.org:  A wonderful resource for patients and healthcare providers for information regarding your disease, ways to tract your treatment, what to expect, etc.      American Cancer Society:  800-227-2345  Can help patients locate various types of support and financial assistance   Cancer Care: 1-800-813-HOPE (4673) Provides financial assistance, online support groups, medication/co-pay assistance.     [place] DSS:  336-641-3447 Where to apply  for food stamps, Medicaid, and utility assistance   Medicare Rights Center: 800-333-4114 Helps people with Medicare understand their rights and benefits, navigate the Medicare system, and secure the quality healthcare they deserve   SCAT: 336-333-6589 [employer]'s shared-ride transportation service for eligible riders who have a disability that prevents them from riding the fixed route bus.     For additional information on assistance programs please contact our social worker:   Abigail Elmore:  [phone]  

## 2018-06-13 ENCOUNTER — Telehealth: Payer: Self-pay | Admitting: Hematology

## 2018-06-13 NOTE — Progress Notes (Signed)
FMLA for mother, Dashiell Franchino, successfully faxed to [place] at [phone]. Mailed copy to patient address on file.

## 2018-06-13 NOTE — Telephone Encounter (Signed)
Called patient and verified upcoming appointments. ° °Printed and mailed calendar. °

## 2018-06-18 ENCOUNTER — Inpatient Hospital Stay (HOSPITAL_BASED_OUTPATIENT_CLINIC_OR_DEPARTMENT_OTHER): Payer: BLUE CROSS/BLUE SHIELD | Admitting: Medical

## 2018-06-18 ENCOUNTER — Telehealth: Payer: Self-pay | Admitting: Emergency Medicine

## 2018-06-18 VITALS — BP 133/76 | HR 90 | Temp 98.0°F | Resp 18 | Ht 68.0 in | Wt 192.1 lb

## 2018-06-18 DIAGNOSIS — Z5111 Encounter for antineoplastic chemotherapy: Secondary | ICD-10-CM | POA: Diagnosis not present

## 2018-06-18 DIAGNOSIS — C8110 Nodular sclerosis classical Hodgkin lymphoma, unspecified site: Secondary | ICD-10-CM

## 2018-06-18 DIAGNOSIS — C8118 Nodular sclerosis classical Hodgkin lymphoma, lymph nodes of multiple sites: Secondary | ICD-10-CM

## 2018-06-18 DIAGNOSIS — S91201A Unspecified open wound of right great toe with damage to nail, initial encounter: Secondary | ICD-10-CM | POA: Diagnosis not present

## 2018-06-18 DIAGNOSIS — S91202A Unspecified open wound of left great toe with damage to nail, initial encounter: Secondary | ICD-10-CM

## 2018-06-18 DIAGNOSIS — S91209A Unspecified open wound of unspecified toe(s) with damage to nail, initial encounter: Secondary | ICD-10-CM

## 2018-06-18 MED ORDER — CEPHALEXIN 500 MG PO CAPS: 500.0000 mg | ORAL_CAPSULE | Freq: Three times a day (TID) | ORAL | 0 refills | Status: DC

## 2018-06-18 NOTE — Patient Instructions (Signed)
Implanted Port Home Guide An implanted port is a type of central line that is placed under the skin. Central lines are used to provide IV access when treatment or nutrition needs to be given through a person's veins. Implanted ports are used for long-term IV access. An implanted port may be placed because:  You need IV medicine that would be irritating to the small veins in your hands or arms.  You need long-term IV medicines, such as antibiotics.  You need IV nutrition for a long period.  You need frequent blood draws for lab tests.  You need dialysis.  Implanted ports are usually placed in the chest area, but they can also be placed in the upper arm, the abdomen, or the leg. An implanted port has two main parts:  Reservoir. The reservoir is round and will appear as a small, raised area under your skin. The reservoir is the part where a needle is inserted to give medicines or draw blood.  Catheter. The catheter is a thin, flexible tube that extends from the reservoir. The catheter is placed into a large vein. Medicine that is inserted into the reservoir goes into the catheter and then into the vein.  How will I care for my incision site? Do not get the incision site wet. Bathe or shower as directed by your health care provider. How is my port accessed? Special steps must be taken to access the port:  Before the port is accessed, a numbing cream can be placed on the skin. This helps numb the skin over the port site.  Your health care provider uses a sterile technique to access the port. ? Your health care provider must put on a mask and sterile gloves. ? The skin over your port is cleaned carefully with an antiseptic and allowed to dry. ? The port is gently pinched between sterile gloves, and a needle is inserted into the port.  Only "non-coring" port needles should be used to access the port. Once the port is accessed, a blood return should be checked. This helps ensure that the port  is in the vein and is not clogged.  If your port needs to remain accessed for a constant infusion, a clear (transparent) bandage will be placed over the needle site. The bandage and needle will need to be changed every week, or as directed by your health care provider.  Keep the bandage covering the needle clean and dry. Do not get it wet. Follow your health care provider's instructions on how to take a shower or bath while the port is accessed.  If your port does not need to stay accessed, no bandage is needed over the port.  What is flushing? Flushing helps keep the port from getting clogged. Follow your health care provider's instructions on how and when to flush the port. Ports are usually flushed with saline solution or a medicine called heparin. The need for flushing will depend on how the port is used.  If the port is used for intermittent medicines or blood draws, the port will need to be flushed: ? After medicines have been given. ? After blood has been drawn. ? As part of routine maintenance.  If a constant infusion is running, the port may not need to be flushed.  How long will my port stay implanted? The port can stay in for as long as your health care provider thinks it is needed. When it is time for the port to come out, surgery will be   done to remove it. The procedure is similar to the one performed when the port was put in. When should I seek immediate medical care? When you have an implanted port, you should seek immediate medical care if:  You notice a bad smell coming from the incision site.  You have swelling, redness, or drainage at the incision site.  You have more swelling or pain at the port site or the surrounding area.  You have a fever that is not controlled with medicine.  This information is not intended to replace advice given to you by your health care provider. Make sure you discuss any questions you have with your health care provider. Document  Released: 07/10/2005 Document Revised: 12/16/2015 Document Reviewed: 03/17/2013 Elsevier Interactive Patient Education  2017 Elsevier Inc.  

## 2018-06-18 NOTE — Progress Notes (Signed)
Pt presents today with bilat swelling, drainage, and tenderness around big toes/nail beds.  Denies recent injury.  States that pain/swelling makes it painful to walk.

## 2018-06-18 NOTE — Telephone Encounter (Signed)
Returning pt's VM asking to be seen for swelling in bilat big toes.  Pt advised to come into [name] clinic today at 2pm.  Verbalized understanding.

## 2018-06-19 NOTE — Progress Notes (Signed)
Symptoms Management Clinic Progress Note   David Lam [id number] February 03, 1996 22 y.o.  David Lam is managed by Dr. Sullivan Lone  Actively treated with chemotherapy/immunotherapy/hormonal therapy: yes  Current Therapy: ABVD  Last Treated: 06/05/2018 (cycle 1, day 1)  Assessment: Plan:    Nodular sclerosing Hodgkin's lymphoma, unspecified body region [facility])  Avulsion of toenail, initial encounter - Plan: cephALEXin (KEFLEX) 500 MG capsule   Nodular sclerosing Hodgkin's lymphoma: The patient is status post cycle 1, day 1 of ABVD which was dosed on 06/05/2018.  He is scheduled to return to clinic for consideration of cycle 2 of therapy on 06/21/2018.  Avulsion of the bilateral great toenails: Patient given prescription for Keflex and was instructed to soak his feet in warm salt water.  He was also told to allow his nails to fall off on their own if it is a appeared to be coming off.    Please see After Visit Summary for patient specific instructions.  Future Appointments  Date Time Provider [place]  06/21/2018  9:45 AM [facility] LAB 3 [facility] None  06/21/2018 10:00 AM [facility] FLUSH [facility] None  06/21/2018 10:20 AM [name], MD [facility] None  06/21/2018 11:15 AM [facility] INFUSION [facility] None  07/05/2018 11:00 AM [facility] LAB 6 [facility] None  07/05/2018 11:15 AM [facility] [place] FLUSH [facility] None  07/05/2018 12:00 PM [facility] INFUSION [facility] None  07/19/2018 10:30 AM [facility] LAB 1 [facility] None  07/19/2018 10:45 AM [facility] [place] FLUSH [facility] None  07/19/2018 12:15 PM [facility] INFUSION [facility] None    No orders of the defined types were placed in this encounter.      Subjective:   Patient ID:  David Lam is a 22 y.o. (DOB [birth year]) male.  Chief Complaint:  Chief Complaint  Patient presents with  . Big Toe, Skin Issue    HPI David Lam is a 23 year old male with a nodular  sclerosing Hodgkin's lymphoma who is managed by Dr. Sullivan Lone and is status post cycle 1 of ABVD chemotherapy.  He presents to the clinic today with a report of swelling of the bilateral great toes over the last 3 days with bloody discharge from under his toenail while soaking his toes.  He states that it appears that his toenails are falling off.  He denies fevers, chills, or sweats.  Medications: I have reviewed the patient's current medications.  Allergies: No Known Allergies  No past medical history on file.  Past Surgical History:  Procedure Laterality Date  . ANKLE SURGERY Left    cleaned up   . FRACTURE SURGERY Left    arm  . IR IMAGING GUIDED PORT INSERTION  06/03/2018  . MEDIASTINOTOMY CHAMBERLAIN MCNEIL N/A 05/10/2018   Procedure: Mediastinal mass biopsy ;  Surgeon: Grace Isaac, MD;  Location: [facility] OR;  Service: Thoracic;  Laterality: N/A;  . ORIF FINGER / THUMB FRACTURE Left     No family history on file.  Social History   Socioeconomic History  . Marital status: Unknown    Spouse name: Not on file  . Number of children: Not on file  . Years of education: Not on file  . Highest education level: Not on file  Occupational History  . Not on file  Social Needs  . Financial resource strain: Not on file  . Food insecurity:    Worry: Not on file    Inability: Not on file  . Transportation needs:    Medical: Not on file  Non-medical: Not on file  Tobacco Use  . Smoking status: Never Smoker  . Smokeless tobacco: Never Used  Substance and Sexual Activity  . Alcohol use: Never    Frequency: Never  . Drug use: Never  . Sexual activity: Not on file  Lifestyle  . Physical activity:    Days per week: Not on file    Minutes per session: Not on file  . Stress: Not on file  Relationships  . Social connections:    Talks on phone: Not on file    Gets together: Not on file    Attends religious service: Not on file    Active member of club or organization:  Not on file    Attends meetings of clubs or organizations: Not on file    Relationship status: Not on file  . Intimate partner violence:    Fear of current or ex partner: Not on file    Emotionally abused: Not on file    Physically abused: Not on file    Forced sexual activity: Not on file  Other Topics Concern  . Not on file  Social History Narrative  . Not on file    Past Medical History, Surgical history, Social history, and Family history were reviewed and updated as appropriate.   Please see review of systems for further details on the patient's review from today.   Review of Systems:  Review of Systems  Constitutional: Negative for chills, diaphoresis and fever.  Skin:       Bilateral great toe pain with avulsion of the bilateral great toenails and with bloody discharge from underneath the toenail.    Objective:   Physical Exam:  BP 133/76 (BP Location: Right Arm, Patient Position: Sitting)   Pulse 90   Temp 98 F (36.7 C) (Oral)   Resp 18   Ht '5\' 8"'  (1.727 m)   Wt 192 lb 1.6 oz (87.1 kg)   SpO2 98%   BMI 29.21 kg/m  ECOG: 0  Physical Exam  Constitutional: No distress.  HENT:  Head: Normocephalic and atraumatic.  Neurological: He is alert. Coordination normal.  Skin: Skin is warm and dry. No rash noted. He is not diaphoretic. No erythema.  The bilateral great toenails appear to be avulsing from the nail bed.  There is tenderness.  No discharge is noted.  No erythema or increased warmth.  Psychiatric: He has a normal mood and affect. His behavior is normal. Judgment and thought content normal.    Lab Review:     Component Value Date/Time   NA 137 06/11/2018 1043   K 4.5 06/11/2018 1043   CL 101 06/11/2018 1043   CO2 28 06/11/2018 1043   GLUCOSE 88 06/11/2018 1043   BUN 13 06/11/2018 1043   CREATININE 0.90 06/11/2018 1043   CALCIUM 9.9 06/11/2018 1043   PROT 8.3 (H) 06/11/2018 1043   ALBUMIN 4.1 06/11/2018 1043   AST 14 (L) 06/11/2018 1043   ALT 30  06/11/2018 1043   ALKPHOS 91 06/11/2018 1043   BILITOT 0.5 06/11/2018 1043   GFRNONAA >60 06/11/2018 1043   GFRAA >60 06/11/2018 1043       Component Value Date/Time   WBC 7.6 06/11/2018 1043   WBC 14.6 (H) 06/03/2018 0817   RBC 4.95 06/11/2018 1043   HGB 13.2 06/11/2018 1043   HCT 39.1 06/11/2018 1043   PLT 265 06/11/2018 1043   MCV 79.0 (L) 06/11/2018 1043   MCH 26.7 06/11/2018 1043   MCHC 33.8 06/11/2018  1043   RDW 15.1 06/11/2018 1043   LYMPHSABS 0.6 (L) 06/11/2018 1043   MONOABS 0.2 06/11/2018 1043   EOSABS 0.4 06/11/2018 1043   BASOSABS 0.0 06/11/2018 1043   -------------------------------  Imaging from last 24 hours (if applicable):  Radiology interpretation: Nm Pet Image Initial (pi) Skull Base To Thigh  Result Date: 05/21/2018 CLINICAL DATA:  Initial treatment strategy for Nodular sclerosis hodgkin lymphoma . EXAM: NUCLEAR MEDICINE PET SKULL BASE TO THIGH TECHNIQUE: 8.82 mCi F-18 FDG was injected intravenously. Full-ring PET imaging was performed from the skull base to thigh after the radiotracer. CT data was obtained and used for attenuation correction and anatomic localization. Fasting blood glucose: 110 mg/dl COMPARISON:  05/08/2018 FINDINGS: Mediastinal blood pool activity: SUV max 1.99 Liver activity: SUV max 2.46. NECK: No hypermetabolic lymph nodes in the neck. Incidental CT findings: none CHEST: No hypermetabolic axillary or supraclavicular lymph nodes. Bulky, predominantly anterior mediastinal hypermetabolic adenopathy is identified. -Left-sided pre-vascular lymph node measures 3.6 x 4.2 cm with SUV max of 4.4. -right paratracheal lymph node measures 1.6 cm with  SUV max of 7.7. -right pre-vascular lymph node measures 6.3 x 4.1 cm and has an SUV max of 6.82. -low left paratracheal lymph node measures 1 cm and has an SUV max of 3.75. No hypermetabolic hilar lymph nodes. Pericardial effusion is again noted, unchanged. Incidental CT findings: No pleural effusion. No  airspace consolidation. ABDOMEN/PELVIS: No abnormal uptake identified within the liver, pancreas, or spleen. No abnormal uptake within the adrenal glands. No hypermetabolic lymph nodes within the abdomen or pelvis. No hypermetabolic inguinal lymph nodes. The spleen appears normal in size without abnormal uptake. Incidental CT findings: none SKELETON: No focal hypermetabolic activity to suggest skeletal metastasis. Incidental CT findings: none IMPRESSION: 1. Hypermetabolic mediastinal adenopathy is identified compatible with history of lymphoma. No hypermetabolic lymph nodes within the neck, abdomen or pelvis. Electronically Signed   By: Kerby Moors M.D.   On: 05/21/2018 15:25   Ct Biopsy  Result Date: 05/22/2018 INDICATION: 22 year old male with a history of Hodgkin's lymphoma EXAM: CT-GUIDED BONE MARROW BIOPSY MEDICATIONS: None. ANESTHESIA/SEDATION: Moderate (conscious) sedation was employed during this procedure. A total of Versed 3.0 mg and Fentanyl 100 mcg was administered intravenously. Moderate Sedation Time: 10 minutes. The patient's level of consciousness and vital signs were monitored continuously by radiology nursing throughout the procedure under my direct supervision. FLUOROSCOPY TIME:  CT COMPLICATIONS: None PROCEDURE: The procedure risks, benefits, and alternatives were explained to the patient. Questions regarding the procedure were encouraged and answered. The patient understands and consents to the procedure. Scout CT of the pelvis was performed for surgical planning purposes. The posterior pelvis was prepped with Chlorhexidine in a sterile fashion, and a sterile drape was applied covering the operative field. A sterile gown and sterile gloves were used for the procedure. Local anesthesia was provided with 1% Lidocaine. Posterior iliac bone was targeted for biopsy. The skin and subcutaneous tissues were infiltrated with 1% lidocaine without epinephrine. A small stab incision was made with an  11 blade scalpel, and an 11 gauge Murphy needle was advanced with CT guidance to the posterior cortex. Manual forced was used to advance the needle through the posterior cortex and the stylet was removed. A bone marrow aspirate was retrieved and passed to a cytotechnologist in the room. The Murphy needle was then advanced without the stylet for a core biopsy. The core biopsy was retrieved and also passed to a cytotechnologist. Manual pressure was used for hemostasis and a  sterile dressing was placed. No complications were encountered no significant blood loss was encountered. Patient tolerated the procedure well and remained hemodynamically stable throughout. IMPRESSION: Status post CT-guided bone marrow biopsy, with tissue specimen sent to pathology for complete histopathologic analysis Signed, Dulcy Fanny. Earleen Newport, DO [facility] Electronically Signed   By: Corrie Mckusick D.O.   On: 05/22/2018 11:10   Ct Bone Marrow Biopsy & Aspiration  Result Date: 05/22/2018 INDICATION: 22 year old male with a history of Hodgkin's lymphoma EXAM: CT-GUIDED BONE MARROW BIOPSY MEDICATIONS: None. ANESTHESIA/SEDATION: Moderate (conscious) sedation was employed during this procedure. A total of Versed 3.0 mg and Fentanyl 100 mcg was administered intravenously. Moderate Sedation Time: 10 minutes. The patient's level of consciousness and vital signs were monitored continuously by radiology nursing throughout the procedure under my direct supervision. FLUOROSCOPY TIME:  CT COMPLICATIONS: None PROCEDURE: The procedure risks, benefits, and alternatives were explained to the patient. Questions regarding the procedure were encouraged and answered. The patient understands and consents to the procedure. Scout CT of the pelvis was performed for surgical planning purposes. The posterior pelvis was prepped with Chlorhexidine in a sterile fashion, and a sterile drape was applied covering  the operative field. A sterile gown and sterile gloves were used for the procedure. Local anesthesia was provided with 1% Lidocaine. Posterior iliac bone was targeted for biopsy. The skin and subcutaneous tissues were infiltrated with 1% lidocaine without epinephrine. A small stab incision was made with an 11 blade scalpel, and an 11 gauge Murphy needle was advanced with CT guidance to the posterior cortex. Manual forced was used to advance the needle through the posterior cortex and the stylet was removed. A bone marrow aspirate was retrieved and passed to a cytotechnologist in the room. The Murphy needle was then advanced without the stylet for a core biopsy. The core biopsy was retrieved and also passed to a cytotechnologist. Manual pressure was used for hemostasis and a sterile dressing was placed. No complications were encountered no significant blood loss was encountered. Patient tolerated the procedure well and remained hemodynamically stable throughout. IMPRESSION: Status post CT-guided bone marrow biopsy, with tissue specimen sent to pathology for complete histopathologic analysis Signed, Dulcy Fanny. Earleen Newport, DO [facility] Electronically Signed   By: Corrie Mckusick D.O.   On: 05/22/2018 11:10   Ir Imaging Guided Port Insertion  Result Date: 06/03/2018 INDICATION: 22 year old with nodular sclerosis Hodgkin's lymphoma. Port-A-Cath needed for treatment. EXAM: FLUOROSCOPIC AND ULTRASOUND GUIDED PLACEMENT OF A SUBCUTANEOUS PORT COMPARISON:  None. MEDICATIONS: Ancef 2 g; The antibiotic was administered within an appropriate time interval prior to skin puncture. ANESTHESIA/SEDATION: Versed 4 mg IV; Fentanyl 100 mcg IV; Moderate Sedation Time:  35 minutes The patient was continuously monitored during the procedure by the interventional radiology nurse under my direct supervision. FLUOROSCOPY TIME:  30 seconds, 5 mGy COMPLICATIONS: None immediate.  PROCEDURE: The procedure, risks, benefits, and alternatives were explained to the patient. Questions regarding the procedure were encouraged and answered. The patient understands and consents to the procedure. Patient was placed supine on the interventional table. Ultrasound confirmed a patent right internal jugular vein. The right chest and neck were cleaned with a skin antiseptic and a sterile drape was placed. Maximal barrier sterile technique was utilized including caps, mask, sterile gowns, sterile gloves, sterile drape, hand hygiene and skin antiseptic. The right neck was anesthetized with 1% lidocaine. Small incision was made in the right neck with a blade. Micropuncture set  was placed in the right internal jugular vein with ultrasound guidance. The micropuncture wire was used for measurement purposes. The right chest was anesthetized with 1% lidocaine with epinephrine. #15 blade was used to make an incision and a subcutaneous port pocket was formed. [place] was assembled. Subcutaneous tunnel was formed with a stiff tunneling device. The port catheter was brought through the subcutaneous tunnel. The port was placed in the subcutaneous pocket. The micropuncture set was exchanged for a peel-away sheath. The catheter was placed through the peel-away sheath and the tip was positioned at the SVC and right atrium junction. Catheter placement was confirmed with fluoroscopy. The port was accessed and flushed with heparinized saline. The port pocket was closed using two layers of absorbable sutures and Dermabond. The vein skin site was closed using a single layer of absorbable suture and Dermabond. Sterile dressings were applied. Patient tolerated the procedure well without an immediate complication. Ultrasound and fluoroscopic images were taken and saved for this procedure. IMPRESSION: Placement of a subcutaneous CT injectable port device. Electronically Signed   By: Markus Daft M.D.   On: 06/03/2018 15:36

## 2018-06-21 ENCOUNTER — Inpatient Hospital Stay: Payer: BLUE CROSS/BLUE SHIELD

## 2018-06-21 ENCOUNTER — Inpatient Hospital Stay (HOSPITAL_BASED_OUTPATIENT_CLINIC_OR_DEPARTMENT_OTHER): Payer: BLUE CROSS/BLUE SHIELD | Admitting: Hematology

## 2018-06-21 ENCOUNTER — Other Ambulatory Visit: Payer: BLUE CROSS/BLUE SHIELD

## 2018-06-21 VITALS — BP 130/78 | HR 91 | Temp 98.6°F | Resp 18 | Ht 68.0 in | Wt 187.2 lb

## 2018-06-21 DIAGNOSIS — S91201D Unspecified open wound of right great toe with damage to nail, subsequent encounter: Secondary | ICD-10-CM

## 2018-06-21 DIAGNOSIS — C8110 Nodular sclerosis classical Hodgkin lymphoma, unspecified site: Secondary | ICD-10-CM

## 2018-06-21 DIAGNOSIS — C8118 Nodular sclerosis classical Hodgkin lymphoma, lymph nodes of multiple sites: Secondary | ICD-10-CM | POA: Diagnosis not present

## 2018-06-21 DIAGNOSIS — S91202D Unspecified open wound of left great toe with damage to nail, subsequent encounter: Secondary | ICD-10-CM | POA: Diagnosis not present

## 2018-06-21 DIAGNOSIS — Z5111 Encounter for antineoplastic chemotherapy: Secondary | ICD-10-CM | POA: Diagnosis not present

## 2018-06-21 LAB — CBC WITH DIFFERENTIAL/PLATELET
Abs Immature Granulocytes: 0.04 10*3/uL (ref 0.00–0.07)
BASOS PCT: 1 %
Basophils Absolute: 0.1 10*3/uL (ref 0.0–0.1)
EOS ABS: 0.3 10*3/uL (ref 0.0–0.5)
EOS PCT: 3 %
HCT: 37.8 % — ABNORMAL LOW (ref 39.0–52.0)
HEMOGLOBIN: 12.8 g/dL — AB (ref 13.0–17.0)
Immature Granulocytes: 1 %
LYMPHS PCT: 11 %
Lymphs Abs: 0.9 10*3/uL (ref 0.7–4.0)
MCH: 26.7 pg (ref 26.0–34.0)
MCHC: 33.9 g/dL (ref 30.0–36.0)
MCV: 78.8 fL — ABNORMAL LOW (ref 80.0–100.0)
MONO ABS: 1.2 10*3/uL — AB (ref 0.1–1.0)
Monocytes Relative: 14 %
NEUTROS ABS: 5.9 10*3/uL (ref 1.7–7.7)
NEUTROS PCT: 70 %
NRBC: 0 % (ref 0.0–0.2)
PLATELETS: 438 10*3/uL — AB (ref 150–400)
RBC: 4.8 MIL/uL (ref 4.22–5.81)
RDW: 15.5 % (ref 11.5–15.5)
WBC: 8.4 10*3/uL (ref 4.0–10.5)

## 2018-06-21 LAB — CMP (CANCER CENTER ONLY)
ALBUMIN: 4 g/dL (ref 3.5–5.0)
ALT: 21 U/L (ref 0–44)
ANION GAP: 8 (ref 5–15)
AST: 14 U/L — ABNORMAL LOW (ref 15–41)
Alkaline Phosphatase: 94 U/L (ref 38–126)
BUN: 12 mg/dL (ref 6–20)
CHLORIDE: 106 mmol/L (ref 98–111)
CO2: 25 mmol/L (ref 22–32)
Calcium: 9.4 mg/dL (ref 8.9–10.3)
Creatinine: 0.86 mg/dL (ref 0.61–1.24)
GFR, Estimated: >60 mL/min (ref >60)
GLUCOSE: 93 mg/dL (ref 70–99)
Potassium: 4.1 mmol/L (ref 3.5–5.1)
Sodium: 139 mmol/L (ref 135–145)
Total Bilirubin: 0.6 mg/dL (ref 0.3–1.2)
Total Protein: 7.9 g/dL (ref 6.5–8.1)

## 2018-06-21 MED ORDER — PALONOSETRON HCL INJECTION 0.25 MG/5ML
INTRAVENOUS | Status: AC
  Filled 2018-06-21: qty 5

## 2018-06-21 MED ORDER — DOXORUBICIN HCL CHEMO IV INJECTION 2 MG/ML
25.0000 mg/m2 | Freq: Once | INTRAVENOUS | Status: AC
  Administered 2018-06-21: 50 mg via INTRAVENOUS
  Filled 2018-06-21: qty 25

## 2018-06-21 MED ORDER — HEPARIN SOD (PORK) LOCK FLUSH 100 UNIT/ML IV SOLN
500.0000 [IU] | Freq: Once | INTRAVENOUS | Status: AC | PRN
  Administered 2018-06-21: 500 [IU]
  Filled 2018-06-21: qty 5

## 2018-06-21 MED ORDER — SODIUM CHLORIDE 0.9% FLUSH
10.0000 mL | INTRAVENOUS | Status: DC | PRN
  Administered 2018-06-21: 10 mL
  Filled 2018-06-21: qty 10

## 2018-06-21 MED ORDER — SODIUM CHLORIDE 0.9 % IV SOLN
375.0000 mg/m2 | Freq: Once | INTRAVENOUS | Status: AC
  Administered 2018-06-21: 750 mg via INTRAVENOUS
  Filled 2018-06-21: qty 75

## 2018-06-21 MED ORDER — PALONOSETRON HCL INJECTION 0.25 MG/5ML
0.2500 mg | Freq: Once | INTRAVENOUS | Status: AC
  Administered 2018-06-21: 0.25 mg via INTRAVENOUS

## 2018-06-21 MED ORDER — SODIUM CHLORIDE 0.9 % IV SOLN
10.0000 [IU]/m2 | Freq: Once | INTRAVENOUS | Status: AC
  Administered 2018-06-21: 20 [IU] via INTRAVENOUS
  Filled 2018-06-21: qty 6.67

## 2018-06-21 MED ORDER — VINBLASTINE SULFATE CHEMO INJECTION 1 MG/ML
6.0500 mg/m2 | Freq: Once | INTRAVENOUS | Status: AC
  Administered 2018-06-21: 12 mg via INTRAVENOUS
  Filled 2018-06-21: qty 12

## 2018-06-21 MED ORDER — SODIUM CHLORIDE 0.9 % IV SOLN
Freq: Once | INTRAVENOUS | Status: AC
  Administered 2018-06-21: 13:00:00 via INTRAVENOUS
  Filled 2018-06-21: qty 250

## 2018-06-21 MED ORDER — SODIUM CHLORIDE 0.9 % IV SOLN
Freq: Once | INTRAVENOUS | Status: AC
  Administered 2018-06-21: 13:00:00 via INTRAVENOUS
  Filled 2018-06-21: qty 5

## 2018-06-21 NOTE — Patient Instructions (Signed)
[facility] Discharge Instructions for Patients Receiving Chemotherapy  Today you received the following chemotherapy agents: Adriamycin, vinblastine, bleomycin, DTIC.  To help prevent nausea and vomiting after your treatment, we encourage you to take your nausea medication as prescribed.   If you develop nausea and vomiting that is not controlled by your nausea medication, call the clinic.   BELOW ARE SYMPTOMS THAT SHOULD BE REPORTED IMMEDIATELY:  *FEVER GREATER THAN 100.5 F  *CHILLS WITH OR WITHOUT FEVER  NAUSEA AND VOMITING THAT IS NOT CONTROLLED WITH YOUR NAUSEA MEDICATION  *UNUSUAL SHORTNESS OF BREATH  *UNUSUAL BRUISING OR BLEEDING  TENDERNESS IN MOUTH AND THROAT WITH OR WITHOUT PRESENCE OF ULCERS  *URINARY PROBLEMS  *BOWEL PROBLEMS  UNUSUAL RASH Items with * indicate a potential emergency and should be followed up as soon as possible.  Feel free to call the clinic should you have any questions or concerns. The clinic phone number is [phone].  Please show the [facility] at check-in to the Emergency Department and triage nurse.   Doxorubicin injection (Adriamycin) What is this medicine? DOXORUBICIN (dox oh ROO bi sin) is a chemotherapy drug. It is used to treat many kinds of cancer like leukemia, lymphoma, neuroblastoma, sarcoma, and Wilms' tumor. It is also used to treat bladder cancer, breast cancer, lung cancer, ovarian cancer, stomach cancer, and thyroid cancer. This medicine may be used for other purposes; ask your health care provider or pharmacist if you have questions. COMMON BRAND NAME(S): Adriamycin, Adriamycin PFS, Adriamycin RDF, Rubex What should I tell my health care provider before I take this medicine? They need to know if you have any of these conditions: -heart disease -history of low blood counts caused by a medicine -liver disease -recent or ongoing radiation therapy -an unusual or allergic reaction to doxorubicin, other  chemotherapy agents, other medicines, foods, dyes, or preservatives -pregnant or trying to get pregnant -breast-feeding How should I use this medicine? This drug is given as an infusion into a vein. It is administered in a hospital or clinic by a specially trained health care professional. If you have pain, swelling, burning or any unusual feeling around the site of your injection, tell your health care professional right away. Talk to your pediatrician regarding the use of this medicine in children. Special care may be needed. Overdosage: If you think you have taken too much of this medicine contact a poison control center or emergency room at once. NOTE: This medicine is only for you. Do not share this medicine with others. What if I miss a dose? It is important not to miss your dose. Call your doctor or health care professional if you are unable to keep an appointment. What may interact with this medicine? This medicine may interact with the following medications: -6-mercaptopurine -paclitaxel -phenytoin -St. John's Wort -trastuzumab -verapamil This list may not describe all possible interactions. Give your health care provider a list of all the medicines, herbs, non-prescription drugs, or dietary supplements you use. Also tell them if you smoke, drink alcohol, or use illegal drugs. Some items may interact with your medicine. What should I watch for while using this medicine? This drug may make you feel generally unwell. This is not uncommon, as chemotherapy can affect healthy cells as well as cancer cells. Report any side effects. Continue your course of treatment even though you feel ill unless your doctor tells you to stop. There is a maximum amount of this medicine you should receive throughout your life. The  amount depends on the medical condition being treated and your overall health. Your doctor will watch how much of this medicine you receive in your lifetime. Tell your doctor if you  have taken this medicine before. You may need blood work done while you are taking this medicine. Your urine may turn red for a few days after your dose. This is not blood. If your urine is dark or brown, call your doctor. In some cases, you may be given additional medicines to help with side effects. Follow all directions for their use. Call your doctor or health care professional for advice if you get a fever, chills or sore throat, or other symptoms of a cold or flu. Do not treat yourself. This drug decreases your body's ability to fight infections. Try to avoid being around people who are sick. This medicine may increase your risk to bruise or bleed. Call your doctor or health care professional if you notice any unusual bleeding. Talk to your doctor about your risk of cancer. You may be more at risk for certain types of cancers if you take this medicine. Do not become pregnant while taking this medicine or for 6 months after stopping it. Women should inform their doctor if they wish to become pregnant or think they might be pregnant. Men should not father a child while taking this medicine and for 6 months after stopping it. There is a potential for serious side effects to an unborn child. Talk to your health care professional or pharmacist for more information. Do not breast-feed an infant while taking this medicine. This medicine has caused ovarian failure in some women and reduced sperm counts in some men This medicine may interfere with the ability to have a child. Talk with your doctor or health care professional if you are concerned about your fertility. What side effects may I notice from receiving this medicine? Side effects that you should report to your doctor or health care professional as soon as possible: -allergic reactions like skin rash, itching or hives, swelling of the face, lips, or tongue -breathing problems -chest pain -fast or irregular heartbeat -low blood counts - this  medicine may decrease the number of white blood cells, red blood cells and platelets. You may be at increased risk for infections and bleeding. -pain, redness, or irritation at site where injected -signs of infection - fever or chills, cough, sore throat, pain or difficulty passing urine -signs of decreased platelets or bleeding - bruising, pinpoint red spots on the skin, black, tarry stools, blood in the urine -swelling of the ankles, feet, hands -tiredness -weakness Side effects that usually do not require medical attention (report to your doctor or health care professional if they continue or are bothersome): -diarrhea -hair loss -mouth sores -nail discoloration or damage -nausea -red colored urine -vomiting This list may not describe all possible side effects. Call your doctor for medical advice about side effects. You may report side effects to FDA at 1-800-FDA-1088. Where should I keep my medicine? This drug is given in a hospital or clinic and will not be stored at home. NOTE: This sheet is a summary. It may not cover all possible information. If you have questions about this medicine, talk to your doctor, pharmacist, or health care provider.  2018 Elsevier/Gold Standard (2015-09-06 11:28:51)  Vinblastine injection What is this medicine? VINBLASTINE (vin BLAS teen) is a chemotherapy drug. It slows the growth of cancer cells. This medicine is used to treat many types of cancer like  breast cancer, testicular cancer, Hodgkin's disease, non-Hodgkin's lymphoma, and sarcoma. This medicine may be used for other purposes; ask your health care provider or pharmacist if you have questions. COMMON BRAND NAME(S): Velban What should I tell my health care provider before I take this medicine? They need to know if you have any of these conditions: -blood disorders -dental disease -gout -infection (especially a virus infection such as chickenpox, cold sores, or herpes) -liver disease -lung  disease -nervous system disease -recent or ongoing radiation therapy -an unusual or allergic reaction to vinblastine, other chemotherapy agents, other medicines, foods, dyes, or preservatives -pregnant or trying to get pregnant -breast-feeding How should I use this medicine? This drug is given as an infusion into a vein. It is administered in a hospital or clinic by a specially trained health care professional. If you have pain, swelling, burning or any unusual feeling around the site of your injection, tell your health care professional right away. Talk to your pediatrician regarding the use of this medicine in children. While this drug may be prescribed for selected conditions, precautions do apply. Overdosage: If you think you have taken too much of this medicine contact a poison control center or emergency room at once. NOTE: This medicine is only for you. Do not share this medicine with others. What if I miss a dose? It is important not to miss your dose. Call your doctor or health care professional if you are unable to keep an appointment. What may interact with this medicine? Do not take this medicine with any of the following medications: -erythromycin -itraconazole -mibefradil -voriconazole This medicine may also interact with the following medications: -cyclosporine -fluconazole -ketoconazole -medicines for seizures like phenytoin -medicines to increase blood counts like filgrastim, pegfilgrastim, sargramostim -vaccines -verapamil Talk to your doctor or health care professional before taking any of these medicines: -acetaminophen -aspirin -ibuprofen -ketoprofen -naproxen This list may not describe all possible interactions. Give your health care provider a list of all the medicines, herbs, non-prescription drugs, or dietary supplements you use. Also tell them if you smoke, drink alcohol, or use illegal drugs. Some items may interact with your medicine. What should I watch  for while using this medicine? Your condition will be monitored carefully while you are receiving this medicine. You will need important blood work done while you are taking this medicine. This drug may make you feel generally unwell. This is not uncommon, as chemotherapy can affect healthy cells as well as cancer cells. Report any side effects. Continue your course of treatment even though you feel ill unless your doctor tells you to stop. In some cases, you may be given additional medicines to help with side effects. Follow all directions for their use. Call your doctor or health care professional for advice if you get a fever, chills or sore throat, or other symptoms of a cold or flu. Do not treat yourself. This drug decreases your body's ability to fight infections. Try to avoid being around people who are sick. This medicine may increase your risk to bruise or bleed. Call your doctor or health care professional if you notice any unusual bleeding. Be careful brushing and flossing your teeth or using a toothpick because you may get an infection or bleed more easily. If you have any dental work done, tell your dentist you are receiving this medicine. Avoid taking products that contain aspirin, acetaminophen, ibuprofen, naproxen, or ketoprofen unless instructed by your doctor. These medicines may hide a fever. Do not become pregnant while  taking this medicine. Women should inform their doctor if they wish to become pregnant or think they might be pregnant. There is a potential for serious side effects to an unborn child. Talk to your health care professional or pharmacist for more information. Do not breast-feed an infant while taking this medicine. Men may have a lower sperm count while taking this medicine. Talk to your doctor if you plan to father a child. What side effects may I notice from receiving this medicine? Side effects that you should report to your doctor or health care professional as soon  as possible: -allergic reactions like skin rash, itching or hives, swelling of the face, lips, or tongue -low blood counts - This drug may decrease the number of white blood cells, red blood cells and platelets. You may be at increased risk for infections and bleeding. -signs of infection - fever or chills, cough, sore throat, pain or difficulty passing urine -signs of decreased platelets or bleeding - bruising, pinpoint red spots on the skin, black, tarry stools, nosebleeds -signs of decreased red blood cells - unusually weak or tired, fainting spells, lightheadedness -breathing problems -changes in hearing -change in the amount of urine -chest pain -high blood pressure -mouth sores -nausea and vomiting -pain, swelling, redness or irritation at the injection site -pain, tingling, numbness in the hands or feet -problems with balance, dizziness -seizures Side effects that usually do not require medical attention (report to your doctor or health care professional if they continue or are bothersome): -constipation -hair loss -jaw pain -loss of appetite -sensitivity to light -stomach pain -tumor pain This list may not describe all possible side effects. Call your doctor for medical advice about side effects. You may report side effects to FDA at 1-800-FDA-1088. Where should I keep my medicine? This drug is given in a hospital or clinic and will not be stored at home. NOTE: This sheet is a summary. It may not cover all possible information. If you have questions about this medicine, talk to your doctor, pharmacist, or health care provider.  2018 Elsevier/Gold Standard (2008-04-06 17:15:59)   Bleomycin injection What is this medicine? BLEOMYCIN (blee oh MYE sin) is a chemotherapy drug. It is used to treat many kinds of cancer like lymphoma, cervical cancer, head and neck cancer, and testicular cancer. It is also used to prevent and to treat fluid build-up around the lungs caused by some  cancers. This medicine may be used for other purposes; ask your health care provider or pharmacist if you have questions. COMMON BRAND NAME(S): Blenoxane What should I tell my health care provider before I take this medicine? They need to know if you have any of these conditions: -cigarette smoker -kidney disease -lung disease -recent or ongoing radiation therapy -an unusual or allergic reaction to bleomycin, other chemotherapy agents, other medicines, foods, dyes, or preservatives -pregnant or trying to get pregnant -breast-feeding How should I use this medicine? This drug is given as an infusion into a vein or a body cavity. It can also be given as an injection into a muscle or under the skin. It is administered in a hospital or clinic by a specially trained health care professional. Talk to your pediatrician regarding the use of this medicine in children. Special care may be needed. Overdosage: If you think you have taken too much of this medicine contact a poison control center or emergency room at once. NOTE: This medicine is only for you. Do not share this medicine with others. What if  I miss a dose? It is important not to miss your dose. Call your doctor or health care professional if you are unable to keep an appointment. What may interact with this medicine? -certain antibiotics given by injection -cisplatin -cyclosporine -diuretics -foscarnet -medicines to increase blood counts like filgrastim, pegfilgrastim, sargramostim -vaccines This list may not describe all possible interactions. Give your health care provider a list of all the medicines, herbs, non-prescription drugs, or dietary supplements you use. Also tell them if you smoke, drink alcohol, or use illegal drugs. Some items may interact with your medicine. What should I watch for while using this medicine? Visit your doctor for checks on your progress. This drug may make you feel generally unwell. This is not uncommon,  as chemotherapy can affect healthy cells as well as cancer cells. Report any side effects. Continue your course of treatment even though you feel ill unless your doctor tells you to stop. Call your doctor or health care professional for advice if you get a fever, chills or sore throat, or other symptoms of a cold or flu. Do not treat yourself. This drug decreases your body's ability to fight infections. Try to avoid being around people who are sick. Avoid taking products that contain aspirin, acetaminophen, ibuprofen, naproxen, or ketoprofen unless instructed by your doctor. These medicines may hide a fever. Do not become pregnant while taking this medicine. Women should inform their doctor if they wish to become pregnant or think they might be pregnant. There is a potential for serious side effects to an unborn child. Talk to your health care professional or pharmacist for more information. Do not breast-feed an infant while taking this medicine. There is a maximum amount of this medicine you should receive throughout your life. The amount depends on the medical condition being treated and your overall health. Your doctor will watch how much of this medicine you receive in your lifetime. Tell your doctor if you have taken this medicine before. What side effects may I notice from receiving this medicine? Side effects that you should report to your doctor or health care professional as soon as possible: -allergic reactions like skin rash, itching or hives, swelling of the face, lips, or tongue -breathing problems -chest pain -confusion -cough -fast, irregular heartbeat -feeling faint or lightheaded, falls -fever or chills -mouth sores -pain, tingling, numbness in the hands or feet -trouble passing urine or change in the amount of urine -yellowing of the eyes or skin Side effects that usually do not require medical attention (report to your doctor or health care professional if they continue or are  bothersome): -darker skin color -hair loss -irritation at site where injected -loss of appetite -nail changes -nausea and vomiting -weight loss This list may not describe all possible side effects. Call your doctor for medical advice about side effects. You may report side effects to FDA at 1-800-FDA-1088. Where should I keep my medicine? This drug is given in a hospital or clinic and will not be stored at home. NOTE: This sheet is a summary. It may not cover all possible information. If you have questions about this medicine, talk to your doctor, pharmacist, or health care provider.  2018 [employer]/Gold Standard (2012-11-05 09:36:48)  Dacarbazine, DTIC injection What is this medicine? DACARBAZINE (da KAR ba zeen) is a chemotherapy drug. This medicine is used to treat skin cancer. It is also used with other medicines to treat Hodgkin's disease. This medicine may be used for other purposes; ask your health care provider  or pharmacist if you have questions. COMMON BRAND NAME(S): DTIC-Dome What should I tell my health care provider before I take this medicine? They need to know if you have any of these conditions: -infection (especially virus infection such as chickenpox, cold sores, or herpes) -kidney disease -liver disease -low blood counts like low platelets, red blood cells, white blood cells -recent radiation therapy -an unusual or allergic reaction to dacarbazine, other chemotherapy agents, other medicines, foods, dyes, or preservatives -pregnant or trying to get pregnant -breast-feeding How should I use this medicine? This drug is given as an injection or infusion into a vein. It is administered in a hospital or clinic by a specially trained health care professional. Talk to your pediatrician regarding the use of this medicine in children. While this drug may be prescribed for selected conditions, precautions do apply. Overdosage: If you think you have taken too much of this  medicine contact a poison control center or emergency room at once. NOTE: This medicine is only for you. Do not share this medicine with others. What if I miss a dose? It is important not to miss your dose. Call your doctor or health care professional if you are unable to keep an appointment. What may interact with this medicine? -medicines to increase blood counts like filgrastim, pegfilgrastim, sargramostim -vaccines This list may not describe all possible interactions. Give your health care provider a list of all the medicines, herbs, non-prescription drugs, or dietary supplements you use. Also tell them if you smoke, drink alcohol, or use illegal drugs. Some items may interact with your medicine. What should I watch for while using this medicine? Your condition will be monitored carefully while you are receiving this medicine. You will need important blood work done while you are taking this medicine. This drug may make you feel generally unwell. This is not uncommon, as chemotherapy can affect healthy cells as well as cancer cells. Report any side effects. Continue your course of treatment even though you feel ill unless your doctor tells you to stop. Call your doctor or health care professional for advice if you get a fever, chills or sore throat, or other symptoms of a cold or flu. Do not treat yourself. This drug decreases your body's ability to fight infections. Try to avoid being around people who are sick. This medicine may increase your risk to bruise or bleed. Call your doctor or health care professional if you notice any unusual bleeding. Talk to your doctor about your risk of cancer. You may be more at risk for certain types of cancers if you take this medicine. Do not become pregnant while taking this medicine. Women should inform their doctor if they wish to become pregnant or think they might be pregnant. There is a potential for serious side effects to an unborn child. Talk to your  health care professional or pharmacist for more information. Do not breast-feed an infant while taking this medicine. What side effects may I notice from receiving this medicine? Side effects that you should report to your doctor or health care professional as soon as possible: -allergic reactions like skin rash, itching or hives, swelling of the face, lips, or tongue -low blood counts - this medicine may decrease the number of white blood cells, red blood cells and platelets. You may be at increased risk for infections and bleeding. -signs of infection - fever or chills, cough, sore throat, pain or difficulty passing urine -signs of decreased platelets or bleeding - bruising, pinpoint red  spots on the skin, black, tarry stools, blood in the urine -signs of decreased red blood cells - unusually weak or tired, fainting spells, lightheadedness -breathing problems -muscle pains -pain at site where injected -trouble passing urine or change in the amount of urine -vomiting -yellowing of the eyes or skin Side effects that usually do not require medical attention (report to your doctor or health care professional if they continue or are bothersome): -diarrhea -hair loss -loss of appetite -nausea -skin more sensitive to sun or ultraviolet light -stomach upset This list may not describe all possible side effects. Call your doctor for medical advice about side effects. You may report side effects to FDA at 1-800-FDA-1088. Where should I keep my medicine? This drug is given in a hospital or clinic and will not be stored at home. NOTE: This sheet is a summary. It may not cover all possible information. If you have questions about this medicine, talk to your doctor, pharmacist, or health care provider.  2018 [employer]/Gold Standard (2015-09-10 15:17:39)

## 2018-06-24 ENCOUNTER — Telehealth: Payer: Self-pay

## 2018-06-24 NOTE — Telephone Encounter (Signed)
Ref. Sent per 11/29 los

## 2018-06-27 NOTE — Progress Notes (Signed)
HEMATOLOGY/ONCOLOGY CLINIC NOTE  Date of Service: .06/21/2018  Patient Care Team: System, Pcp Not In as PCP - General  CHIEF COMPLAINTS/PURPOSE OF CONSULTATION:  Nodular sclerosis Hodgkin's Lymphoma   HISTORY OF PRESENTING ILLNESS:   David Lam is a wonderful 22 y.o. male who has been referred to [place] by Dr. 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 He is accompanied today by his mother and sister. The pt reports that he is doing well overall.   The pt presented to the ED on 05/07/18 after developing chest pain over 2 days, with positional elements. The pt was evaluated with a CXR which revealed a mediastinal mass that was biopsied on 05/10/18 and confirmed nodular sclerosis Hodgkin's lymphoma, as noted below.   The pt notes that he though his chest discomfort was due to working out, and denies noticing any swelling in the area. The pt denies any recent fevers, chills, night sweats, skin rashes, unexpected weight loss. The pt notes that his chest pain continues and is making his sleeping difficult. He denies any difficulty ambulating or SOB.   The pt reports that he has not had any prior medical problems, and has had left thumb and left ankle surgeries from injuries. 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   Of note prior to the patient's visit today, pt has had a [place] lymph node biopsy completed on 05/10/18 with results revealing Nodular sclerosis Hodgkin's Lymphoma    The pt also had a CTA Chest on 05/07/18 which revealed Bulky, bilateral anterior superior mediastinal confluent soft tissue masses which are not calcified in appearance and which do not appear to cause occlusion or significant luminal narrowing of traversing vasculature. Leading consideration is lymphoma, possibly Hodgkin's. Given lack of differentiating soft tissue densities, a teratoma is believed less likely. Nonseminomatous germ cell tumor given presence of  small effusions might also be within differential though no pulmonary lesions are visualized. Metastatic disease, infection or inflammatory process are believed less likely.  The pt also had a CT A/P on 05/08/18 which revealed No acute finding or evidence of malignancy in the abdomen.  Most recent lab results (05/09/18) of CBC is as follows: all values are WNL except for WBC at 10.6k, HGB at 11.9, HCT at 34.7, MCV at 78.5, ANC at 7.9k, Eosinophils abs at 600. 05/08/18 Sed Rate at 19  On review of systems, pt reports chest pain, moving his bowels well, and denies skin rashes, fevers, chills, night sweats, unexpected weight loss, SOB, headaches, changes in vision, urinary pain or discomfort, abdominal pains, testicular pain or swelling, leg swelling, arm swelling, and any other symptoms.   On PMHx the pt reports left thumb and left ankle surgeries, denies blood transfusions. On Social Hx the pt denies alcohol consumption and denies smoking cigarettes or other recreational drugs. 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 Denies tattoos.  On Family Hx the pt denies any blood disorders or cancers.  He denies any medications.  Interval History:   David Lam returns today for continued mx of his CHL. Notes no acute new symptoms. No overt CP/SOB. No tingling/numbness in her hands and feet. Labs reviewed with the patient.  On review of systems, pt reports eating well, well controlled mild nausea, fingertip numbness, and denies vomiting, diarrhea, abdominal pains, mouth sores, chest pain, chest heaviness, and any other symptoms.   MEDICAL HISTORY:  No past medical history on file.  SURGICAL HISTORY: Past Surgical History:  Procedure Laterality Date  . ANKLE SURGERY Left    cleaned up   . FRACTURE SURGERY Left    arm  . IR IMAGING GUIDED PORT INSERTION  06/03/2018  . MEDIASTINOTOMY CHAMBERLAIN MCNEIL N/A 05/10/2018   Procedure: Mediastinal mass biopsy ;  Surgeon: Grace Isaac, MD;  Location: [facility] OR;  Service: Thoracic;  Laterality: N/A;  . ORIF FINGER / THUMB FRACTURE Left     SOCIAL HISTORY: Social History   Socioeconomic History  . Marital status: Unknown    Spouse name: Not on file  . Number of children: Not on file  . Years of education: Not on file  . Highest education level: Not on file  Occupational History  . Not on file  Social Needs  . Financial resource strain: Not on file  . Food insecurity:    Worry: Not on file    Inability: Not on file  . Transportation needs:    Medical: Not on file    Non-medical: Not on file  Tobacco Use  . Smoking status: Never Smoker  . Smokeless tobacco: Never Used  Substance and Sexual Activity  . Alcohol use: Never    Frequency: Never  . Drug use: Never  . Sexual activity: Not on file  Lifestyle  . Physical activity:    Days per week: Not on file    Minutes per session: Not on file  . Stress: Not on file  Relationships  . Social connections:    Talks on phone: Not on file    Gets together: Not on file    Attends religious service: Not on file    Active member of club or organization: Not on file    Attends meetings of clubs or organizations: Not on file    Relationship status: Not on file  . Intimate partner violence:    Fear of current or ex partner: Not on file    Emotionally abused: Not on file    Physically abused: Not on file    Forced sexual activity: Not on file  Other Topics Concern  . Not on file  Social History Narrative  . Not on file    FAMILY HISTORY: No family history on file.  ALLERGIES:  has No Known Allergies.  MEDICATIONS:  Current Outpatient Medications  Medication Sig Dispense Refill  . cephALEXin (KEFLEX) 500 MG capsule Take 1 capsule (500 mg total) by mouth 3 (three) times daily. 21 capsule 0  . diphenhydrAMINE (BENADRYL) 25 mg capsule Take 25 mg by mouth as needed.    . lidocaine-prilocaine (EMLA) cream Apply 1 application topically as needed. 30 g 0  .  prochlorperazine (COMPAZINE) 10 MG tablet Take 1 tablet (10 mg total) by mouth every 6 (six) hours as needed for nausea or vomiting. 30 tablet 0   No current facility-administered medications for this visit.     REVIEW OF SYSTEMS:    .10 Point review of Systems was done is negative except as noted above.  PHYSICAL EXAMINATION: ECOG PERFORMANCE STATUS: 1 - Symptomatic but completely ambulatory  . Vitals:   06/21/18 1143  BP: 130/78  Pulse: 91  Resp: 18  Temp: 98.6 F (37 C)  SpO2: 100%   Filed Weights   06/21/18 1143  Weight: 187 lb 3.2 oz (84.9 kg)   .Body mass index is 28.46 kg/m.  Marland Kitchen GENERAL:alert, in no acute distress and comfortable SKIN: no acute rashes, no significant lesions EYES: conjunctiva are pink and non-injected, sclera anicteric OROPHARYNX: MMM, no  exudates, no oropharyngeal erythema or ulceration NECK: supple, no JVD LYMPH:  no palpable lymphadenopathy in the cervical, axillary or inguinal regions LUNGS: clear to auscultation b/l with normal respiratory effort HEART: regular rate & rhythm ABDOMEN:  normoactive bowel sounds , non tender, not distended. Extremity: no pedal edema PSYCH: alert & oriented x 3 with fluent speech NEURO: no focal motor/sensory deficits  LABORATORY DATA:  I have reviewed the data as listed  . CBC Latest Ref Rng & Units 06/21/2018 06/11/2018 06/03/2018  WBC 4.0 - 10.5 K/uL 8.4 7.6 14.6(H)  Hemoglobin 13.0 - 17.0 g/dL 12.8(L) 13.2 13.4  Hematocrit 39.0 - 52.0 % 37.8(L) 39.1 39.4  Platelets 150 - 400 K/uL 438(H) 265 278    . CMP Latest Ref Rng & Units 06/21/2018 06/11/2018 06/05/2018  Glucose 70 - 99 mg/dL 93 88 89  BUN 6 - 20 mg/dL 12 13 9   Creatinine 0.61 - 1.24 mg/dL 0.86 0.90 0.99  Sodium 135 - 145 mmol/L 139 137 137  Potassium 3.5 - 5.1 mmol/L 4.1 4.5 4.5  Chloride 98 - 111 mmol/L 106 101 103  CO2 22 - 32 mmol/L 25 28 27   Calcium 8.9 - 10.3 mg/dL 9.4 9.9 9.7  Total Protein 6.5 - 8.1 g/dL 7.9 8.3(H) 8.3(H)  Total  Bilirubin 0.3 - 1.2 mg/dL 0.6 0.5 0.8  Alkaline Phos 38 - 126 U/L 94 91 85  AST 15 - 41 U/L 14(L) 14(L) 14(L)  ALT 0 - 44 U/L 21 30 16    05/10/18 Biopsy:   05/22/18 BM Bx:    RADIOGRAPHIC STUDIES: I have personally reviewed the radiological images as listed and agreed with the findings in the report. Ir Imaging Guided Port Insertion  Result Date: 06/03/2018 INDICATION: 22 year old with nodular sclerosis Hodgkin's lymphoma. Port-A-Cath needed for treatment. EXAM: FLUOROSCOPIC AND ULTRASOUND GUIDED PLACEMENT OF A SUBCUTANEOUS PORT COMPARISON:  None. MEDICATIONS: Ancef 2 g; The antibiotic was administered within an appropriate time interval prior to skin puncture. ANESTHESIA/SEDATION: Versed 4 mg IV; Fentanyl 100 mcg IV; Moderate Sedation Time:  35 minutes The patient was continuously monitored during the procedure by the interventional radiology nurse under my direct supervision. FLUOROSCOPY TIME:  30 seconds, 5 mGy COMPLICATIONS: None immediate. PROCEDURE: The procedure, risks, benefits, and alternatives were explained to the patient. Questions regarding the procedure were encouraged and answered. The patient understands and consents to the procedure. Patient was placed supine on the interventional table. Ultrasound confirmed a patent right internal jugular vein. The right chest and neck were cleaned with a skin antiseptic and a sterile drape was placed. Maximal barrier sterile technique was utilized including caps, mask, sterile gowns, sterile gloves, sterile drape, hand hygiene and skin antiseptic. The right neck was anesthetized with 1% lidocaine. Small incision was made in the right neck with a blade. Micropuncture set was placed in the right internal jugular vein with ultrasound guidance. The micropuncture wire was used for measurement purposes. The right chest was anesthetized with 1% lidocaine with epinephrine. #15 blade was used to make an incision and a subcutaneous port pocket was formed. [place] was assembled. Subcutaneous tunnel was formed with a stiff tunneling device. The port catheter was brought through the subcutaneous tunnel. The port was placed in the subcutaneous pocket. The micropuncture set was exchanged for a peel-away sheath. The catheter was placed through the peel-away sheath and the tip was positioned at the SVC and right atrium junction. Catheter placement was confirmed with fluoroscopy. The port was accessed and flushed with  heparinized saline. The port pocket was closed using two layers of absorbable sutures and Dermabond. The vein skin site was closed using a single layer of absorbable suture and Dermabond. Sterile dressings were applied. Patient tolerated the procedure well without an immediate complication. Ultrasound and fluoroscopic images were taken and saved for this procedure. IMPRESSION: Placement of a subcutaneous CT injectable port device. Electronically Signed   By: Markus Daft M.D.   On: 06/03/2018 15:36    ASSESSMENT & PLAN:   22 y.o. male with  1. 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 Leading consideration is lymphoma, possibly Hodgkin's. Given lack of differentiating soft tissue densities, a teratoma is believed less likely. Nonseminomatous germ cell tumor given presence of small effusions might also be within differential though no pulmonary lesions are visualized. Metastatic disease, infection or inflammatory process are believed less likely.    05/08/18 CT A/P revealed No acute finding or evidence of malignancy in the abdomen.  05/08/18 ECHO revealed LV EF of 55-60%   05/10/18 Scalene lymph node Biopsy revealed Nodular sclerosis Hodgkin's Lymphoma   05/08/18 ECHO revealed  LV EF of 55-60%  05/16/18 Hep B, Hep C, and HIV labs all negative   05/21/18 PET/CT revealed Hypermetabolic mediastinal adenopathy is identified compatible with history of lymphoma. No hypermetabolic lymph nodes within the neck, abdomen or pelvis.  05/22/18 BM Bx was negative   PLAN:   -Discussed pt labwork today, 06/21/18; blood counts are stable, normal WBC, normal HGB and normal PLT -The pt has no prohibitive toxicities from continuing C1D15 of ABVD at this time -Avoid uncooked or raw foods, and recommended avoiding crowds and individuals with infections -Begin Vitamin B complex -Recommend salt and baking soda mouthwashes for moutrh soreness  2. B/L great toe subungal hematomas - likely from chemotherapy -podiatry referral  -Please schedule next 2 treatments of ABVD q2weeks with labs and MD visit -Podiatry urgent referral for left 1st toe subungal hematoma/infection All of the patients questions were answered with apparent satisfaction. The patient knows to call the clinic with any problems, questions or concerns.  The total time spent in the appt was 25 minutes and more than 50% was on counseling and direct patient cares.    Sullivan Lone MD [place] AAHIVMS [facility] Hematology/Oncology Physician [facility]  (Office):       [phone] (Work cell):  [phone] (Fax):           [phone]  06/27/2018 11:22 PM

## 2018-06-28 ENCOUNTER — Ambulatory Visit: Payer: BLUE CROSS/BLUE SHIELD | Admitting: Podiatry

## 2018-06-28 VITALS — BP 117/56 | HR 106

## 2018-06-28 DIAGNOSIS — L600 Ingrowing nail: Secondary | ICD-10-CM

## 2018-06-28 DIAGNOSIS — M79676 Pain in unspecified toe(s): Secondary | ICD-10-CM | POA: Diagnosis not present

## 2018-06-28 NOTE — Patient Instructions (Signed)

## 2018-07-05 ENCOUNTER — Inpatient Hospital Stay: Payer: BLUE CROSS/BLUE SHIELD

## 2018-07-05 ENCOUNTER — Inpatient Hospital Stay: Payer: BLUE CROSS/BLUE SHIELD | Attending: Hematology

## 2018-07-05 ENCOUNTER — Inpatient Hospital Stay (HOSPITAL_BASED_OUTPATIENT_CLINIC_OR_DEPARTMENT_OTHER): Payer: BLUE CROSS/BLUE SHIELD | Admitting: Hematology

## 2018-07-05 VITALS — BP 144/78 | HR 88 | Temp 97.9°F | Resp 16 | Wt 188.8 lb

## 2018-07-05 DIAGNOSIS — C8110 Nodular sclerosis classical Hodgkin lymphoma, unspecified site: Secondary | ICD-10-CM

## 2018-07-05 DIAGNOSIS — C8118 Nodular sclerosis classical Hodgkin lymphoma, lymph nodes of multiple sites: Secondary | ICD-10-CM | POA: Insufficient documentation

## 2018-07-05 DIAGNOSIS — Z5111 Encounter for antineoplastic chemotherapy: Secondary | ICD-10-CM | POA: Diagnosis not present

## 2018-07-05 DIAGNOSIS — Z95828 Presence of other vascular implants and grafts: Secondary | ICD-10-CM

## 2018-07-05 LAB — CBC WITH DIFFERENTIAL/PLATELET
Abs Immature Granulocytes: 0.06 10*3/uL (ref 0.00–0.07)
Basophils Absolute: 0.1 10*3/uL (ref 0.0–0.1)
Basophils Relative: 0 %
Eosinophils Absolute: 0.2 10*3/uL (ref 0.0–0.5)
Eosinophils Relative: 2 %
HCT: 37.4 % — ABNORMAL LOW (ref 39.0–52.0)
HEMOGLOBIN: 12.8 g/dL — AB (ref 13.0–17.0)
Immature Granulocytes: 1 %
LYMPHS ABS: 0.7 10*3/uL (ref 0.7–4.0)
LYMPHS PCT: 6 %
MCH: 27.1 pg (ref 26.0–34.0)
MCHC: 34.2 g/dL (ref 30.0–36.0)
MCV: 79.1 fL — ABNORMAL LOW (ref 80.0–100.0)
Monocytes Absolute: 1.1 10*3/uL — ABNORMAL HIGH (ref 0.1–1.0)
Monocytes Relative: 8 %
Neutro Abs: 10.5 10*3/uL — ABNORMAL HIGH (ref 1.7–7.7)
Neutrophils Relative %: 83 %
Platelets: 316 10*3/uL (ref 150–400)
RBC: 4.73 MIL/uL (ref 4.22–5.81)
RDW: 15.4 % (ref 11.5–15.5)
WBC: 12.6 10*3/uL — AB (ref 4.0–10.5)
nRBC: 0 % (ref 0.0–0.2)

## 2018-07-05 LAB — CMP (CANCER CENTER ONLY)
ALT: 21 U/L (ref 0–44)
AST: 14 U/L — ABNORMAL LOW (ref 15–41)
Albumin: 3.8 g/dL (ref 3.5–5.0)
Alkaline Phosphatase: 90 U/L (ref 38–126)
Anion gap: 9 (ref 5–15)
BUN: 16 mg/dL (ref 6–20)
CO2: 27 mmol/L (ref 22–32)
CREATININE: 1 mg/dL (ref 0.61–1.24)
Calcium: 9.3 mg/dL (ref 8.9–10.3)
Chloride: 104 mmol/L (ref 98–111)
Glucose, Bld: 98 mg/dL (ref 70–99)
Potassium: 3.7 mmol/L (ref 3.5–5.1)
Sodium: 140 mmol/L (ref 135–145)
Total Bilirubin: 0.4 mg/dL (ref 0.3–1.2)
Total Protein: 7.7 g/dL (ref 6.5–8.1)

## 2018-07-05 LAB — SEDIMENTATION RATE: Sed Rate: 11 mm/hr (ref 0–16)

## 2018-07-05 MED ORDER — HEPARIN SOD (PORK) LOCK FLUSH 100 UNIT/ML IV SOLN
500.0000 [IU] | Freq: Once | INTRAVENOUS | Status: AC | PRN
  Administered 2018-07-05: 500 [IU]
  Filled 2018-07-05: qty 5

## 2018-07-05 MED ORDER — PALONOSETRON HCL INJECTION 0.25 MG/5ML
INTRAVENOUS | Status: AC
  Filled 2018-07-05: qty 5

## 2018-07-05 MED ORDER — DOXORUBICIN HCL CHEMO IV INJECTION 2 MG/ML
25.0000 mg/m2 | Freq: Once | INTRAVENOUS | Status: AC
  Administered 2018-07-05: 50 mg via INTRAVENOUS
  Filled 2018-07-05: qty 25

## 2018-07-05 MED ORDER — SODIUM CHLORIDE 0.9% FLUSH
10.0000 mL | INTRAVENOUS | Status: DC | PRN
  Administered 2018-07-05: 10 mL
  Filled 2018-07-05: qty 10

## 2018-07-05 MED ORDER — SODIUM CHLORIDE 0.9 % IV SOLN
Freq: Once | INTRAVENOUS | Status: AC
  Administered 2018-07-05: 13:00:00 via INTRAVENOUS
  Filled 2018-07-05: qty 250

## 2018-07-05 MED ORDER — PALONOSETRON HCL INJECTION 0.25 MG/5ML
0.2500 mg | Freq: Once | INTRAVENOUS | Status: AC
  Administered 2018-07-05: 0.25 mg via INTRAVENOUS

## 2018-07-05 MED ORDER — SODIUM CHLORIDE 0.9 % IV SOLN
10.0000 [IU]/m2 | Freq: Once | INTRAVENOUS | Status: AC
  Administered 2018-07-05: 20 [IU] via INTRAVENOUS
  Filled 2018-07-05: qty 6.67

## 2018-07-05 MED ORDER — VINBLASTINE SULFATE CHEMO INJECTION 1 MG/ML
6.0200 mg/m2 | Freq: Once | INTRAVENOUS | Status: AC
  Administered 2018-07-05: 12 mg via INTRAVENOUS
  Filled 2018-07-05: qty 12

## 2018-07-05 MED ORDER — SODIUM CHLORIDE 0.9 % IV SOLN
Freq: Once | INTRAVENOUS | Status: AC
  Administered 2018-07-05: 13:00:00 via INTRAVENOUS
  Filled 2018-07-05: qty 5

## 2018-07-05 MED ORDER — SODIUM CHLORIDE 0.9 % IV SOLN
375.0000 mg/m2 | Freq: Once | INTRAVENOUS | Status: AC
  Administered 2018-07-05: 750 mg via INTRAVENOUS
  Filled 2018-07-05: qty 75

## 2018-07-05 NOTE — Patient Instructions (Signed)
[facility] Discharge Instructions for Patients Receiving Chemotherapy  Today you received the following chemotherapy agents: Adriamycin, vinblastine, bleomycin, DTIC.  To help prevent nausea and vomiting after your treatment, we encourage you to take your nausea medication as prescribed.   If you develop nausea and vomiting that is not controlled by your nausea medication, call the clinic.   BELOW ARE SYMPTOMS THAT SHOULD BE REPORTED IMMEDIATELY:  *FEVER GREATER THAN 100.5 F  *CHILLS WITH OR WITHOUT FEVER  NAUSEA AND VOMITING THAT IS NOT CONTROLLED WITH YOUR NAUSEA MEDICATION  *UNUSUAL SHORTNESS OF BREATH  *UNUSUAL BRUISING OR BLEEDING  TENDERNESS IN MOUTH AND THROAT WITH OR WITHOUT PRESENCE OF ULCERS  *URINARY PROBLEMS  *BOWEL PROBLEMS  UNUSUAL RASH Items with * indicate a potential emergency and should be followed up as soon as possible.  Feel free to call the clinic should you have any questions or concerns. The clinic phone number is [phone].  Please show the [facility] at check-in to the Emergency Department and triage nurse.   Doxorubicin injection (Adriamycin) What is this medicine? DOXORUBICIN (dox oh ROO bi sin) is a chemotherapy drug. It is used to treat many kinds of cancer like leukemia, lymphoma, neuroblastoma, sarcoma, and Wilms' tumor. It is also used to treat bladder cancer, breast cancer, lung cancer, ovarian cancer, stomach cancer, and thyroid cancer. This medicine may be used for other purposes; ask your health care provider or pharmacist if you have questions. COMMON BRAND NAME(S): Adriamycin, Adriamycin PFS, Adriamycin RDF, Rubex What should I tell my health care provider before I take this medicine? They need to know if you have any of these conditions: -heart disease -history of low blood counts caused by a medicine -liver disease -recent or ongoing radiation therapy -an unusual or allergic reaction to doxorubicin, other  chemotherapy agents, other medicines, foods, dyes, or preservatives -pregnant or trying to get pregnant -breast-feeding How should I use this medicine? This drug is given as an infusion into a vein. It is administered in a hospital or clinic by a specially trained health care professional. If you have pain, swelling, burning or any unusual feeling around the site of your injection, tell your health care professional right away. Talk to your pediatrician regarding the use of this medicine in children. Special care may be needed. Overdosage: If you think you have taken too much of this medicine contact a poison control center or emergency room at once. NOTE: This medicine is only for you. Do not share this medicine with others. What if I miss a dose? It is important not to miss your dose. Call your doctor or health care professional if you are unable to keep an appointment. What may interact with this medicine? This medicine may interact with the following medications: -6-mercaptopurine -paclitaxel -phenytoin -St. John's Wort -trastuzumab -verapamil This list may not describe all possible interactions. Give your health care provider a list of all the medicines, herbs, non-prescription drugs, or dietary supplements you use. Also tell them if you smoke, drink alcohol, or use illegal drugs. Some items may interact with your medicine. What should I watch for while using this medicine? This drug may make you feel generally unwell. This is not uncommon, as chemotherapy can affect healthy cells as well as cancer cells. Report any side effects. Continue your course of treatment even though you feel ill unless your doctor tells you to stop. There is a maximum amount of this medicine you should receive throughout your life. The  amount depends on the medical condition being treated and your overall health. Your doctor will watch how much of this medicine you receive in your lifetime. Tell your doctor if you  have taken this medicine before. You may need blood work done while you are taking this medicine. Your urine may turn red for a few days after your dose. This is not blood. If your urine is dark or brown, call your doctor. In some cases, you may be given additional medicines to help with side effects. Follow all directions for their use. Call your doctor or health care professional for advice if you get a fever, chills or sore throat, or other symptoms of a cold or flu. Do not treat yourself. This drug decreases your body's ability to fight infections. Try to avoid being around people who are sick. This medicine may increase your risk to bruise or bleed. Call your doctor or health care professional if you notice any unusual bleeding. Talk to your doctor about your risk of cancer. You may be more at risk for certain types of cancers if you take this medicine. Do not become pregnant while taking this medicine or for 6 months after stopping it. Women should inform their doctor if they wish to become pregnant or think they might be pregnant. Men should not father a child while taking this medicine and for 6 months after stopping it. There is a potential for serious side effects to an unborn child. Talk to your health care professional or pharmacist for more information. Do not breast-feed an infant while taking this medicine. This medicine has caused ovarian failure in some women and reduced sperm counts in some men This medicine may interfere with the ability to have a child. Talk with your doctor or health care professional if you are concerned about your fertility. What side effects may I notice from receiving this medicine? Side effects that you should report to your doctor or health care professional as soon as possible: -allergic reactions like skin rash, itching or hives, swelling of the face, lips, or tongue -breathing problems -chest pain -fast or irregular heartbeat -low blood counts - this  medicine may decrease the number of white blood cells, red blood cells and platelets. You may be at increased risk for infections and bleeding. -pain, redness, or irritation at site where injected -signs of infection - fever or chills, cough, sore throat, pain or difficulty passing urine -signs of decreased platelets or bleeding - bruising, pinpoint red spots on the skin, black, tarry stools, blood in the urine -swelling of the ankles, feet, hands -tiredness -weakness Side effects that usually do not require medical attention (report to your doctor or health care professional if they continue or are bothersome): -diarrhea -hair loss -mouth sores -nail discoloration or damage -nausea -red colored urine -vomiting This list may not describe all possible side effects. Call your doctor for medical advice about side effects. You may report side effects to FDA at 1-800-FDA-1088. Where should I keep my medicine? This drug is given in a hospital or clinic and will not be stored at home. NOTE: This sheet is a summary. It may not cover all possible information. If you have questions about this medicine, talk to your doctor, pharmacist, or health care provider.  2018 Elsevier/Gold Standard (2015-09-06 11:28:51)  Vinblastine injection What is this medicine? VINBLASTINE (vin BLAS teen) is a chemotherapy drug. It slows the growth of cancer cells. This medicine is used to treat many types of cancer like  breast cancer, testicular cancer, Hodgkin's disease, non-Hodgkin's lymphoma, and sarcoma. This medicine may be used for other purposes; ask your health care provider or pharmacist if you have questions. COMMON BRAND NAME(S): Velban What should I tell my health care provider before I take this medicine? They need to know if you have any of these conditions: -blood disorders -dental disease -gout -infection (especially a virus infection such as chickenpox, cold sores, or herpes) -liver disease -lung  disease -nervous system disease -recent or ongoing radiation therapy -an unusual or allergic reaction to vinblastine, other chemotherapy agents, other medicines, foods, dyes, or preservatives -pregnant or trying to get pregnant -breast-feeding How should I use this medicine? This drug is given as an infusion into a vein. It is administered in a hospital or clinic by a specially trained health care professional. If you have pain, swelling, burning or any unusual feeling around the site of your injection, tell your health care professional right away. Talk to your pediatrician regarding the use of this medicine in children. While this drug may be prescribed for selected conditions, precautions do apply. Overdosage: If you think you have taken too much of this medicine contact a poison control center or emergency room at once. NOTE: This medicine is only for you. Do not share this medicine with others. What if I miss a dose? It is important not to miss your dose. Call your doctor or health care professional if you are unable to keep an appointment. What may interact with this medicine? Do not take this medicine with any of the following medications: -erythromycin -itraconazole -mibefradil -voriconazole This medicine may also interact with the following medications: -cyclosporine -fluconazole -ketoconazole -medicines for seizures like phenytoin -medicines to increase blood counts like filgrastim, pegfilgrastim, sargramostim -vaccines -verapamil Talk to your doctor or health care professional before taking any of these medicines: -acetaminophen -aspirin -ibuprofen -ketoprofen -naproxen This list may not describe all possible interactions. Give your health care provider a list of all the medicines, herbs, non-prescription drugs, or dietary supplements you use. Also tell them if you smoke, drink alcohol, or use illegal drugs. Some items may interact with your medicine. What should I watch  for while using this medicine? Your condition will be monitored carefully while you are receiving this medicine. You will need important blood work done while you are taking this medicine. This drug may make you feel generally unwell. This is not uncommon, as chemotherapy can affect healthy cells as well as cancer cells. Report any side effects. Continue your course of treatment even though you feel ill unless your doctor tells you to stop. In some cases, you may be given additional medicines to help with side effects. Follow all directions for their use. Call your doctor or health care professional for advice if you get a fever, chills or sore throat, or other symptoms of a cold or flu. Do not treat yourself. This drug decreases your body's ability to fight infections. Try to avoid being around people who are sick. This medicine may increase your risk to bruise or bleed. Call your doctor or health care professional if you notice any unusual bleeding. Be careful brushing and flossing your teeth or using a toothpick because you may get an infection or bleed more easily. If you have any dental work done, tell your dentist you are receiving this medicine. Avoid taking products that contain aspirin, acetaminophen, ibuprofen, naproxen, or ketoprofen unless instructed by your doctor. These medicines may hide a fever. Do not become pregnant while  taking this medicine. Women should inform their doctor if they wish to become pregnant or think they might be pregnant. There is a potential for serious side effects to an unborn child. Talk to your health care professional or pharmacist for more information. Do not breast-feed an infant while taking this medicine. Men may have a lower sperm count while taking this medicine. Talk to your doctor if you plan to father a child. What side effects may I notice from receiving this medicine? Side effects that you should report to your doctor or health care professional as soon  as possible: -allergic reactions like skin rash, itching or hives, swelling of the face, lips, or tongue -low blood counts - This drug may decrease the number of white blood cells, red blood cells and platelets. You may be at increased risk for infections and bleeding. -signs of infection - fever or chills, cough, sore throat, pain or difficulty passing urine -signs of decreased platelets or bleeding - bruising, pinpoint red spots on the skin, black, tarry stools, nosebleeds -signs of decreased red blood cells - unusually weak or tired, fainting spells, lightheadedness -breathing problems -changes in hearing -change in the amount of urine -chest pain -high blood pressure -mouth sores -nausea and vomiting -pain, swelling, redness or irritation at the injection site -pain, tingling, numbness in the hands or feet -problems with balance, dizziness -seizures Side effects that usually do not require medical attention (report to your doctor or health care professional if they continue or are bothersome): -constipation -hair loss -jaw pain -loss of appetite -sensitivity to light -stomach pain -tumor pain This list may not describe all possible side effects. Call your doctor for medical advice about side effects. You may report side effects to FDA at 1-800-FDA-1088. Where should I keep my medicine? This drug is given in a hospital or clinic and will not be stored at home. NOTE: This sheet is a summary. It may not cover all possible information. If you have questions about this medicine, talk to your doctor, pharmacist, or health care provider.  2018 Elsevier/Gold Standard (2008-04-06 17:15:59)   Bleomycin injection What is this medicine? BLEOMYCIN (blee oh MYE sin) is a chemotherapy drug. It is used to treat many kinds of cancer like lymphoma, cervical cancer, head and neck cancer, and testicular cancer. It is also used to prevent and to treat fluid build-up around the lungs caused by some  cancers. This medicine may be used for other purposes; ask your health care provider or pharmacist if you have questions. COMMON BRAND NAME(S): Blenoxane What should I tell my health care provider before I take this medicine? They need to know if you have any of these conditions: -cigarette smoker -kidney disease -lung disease -recent or ongoing radiation therapy -an unusual or allergic reaction to bleomycin, other chemotherapy agents, other medicines, foods, dyes, or preservatives -pregnant or trying to get pregnant -breast-feeding How should I use this medicine? This drug is given as an infusion into a vein or a body cavity. It can also be given as an injection into a muscle or under the skin. It is administered in a hospital or clinic by a specially trained health care professional. Talk to your pediatrician regarding the use of this medicine in children. Special care may be needed. Overdosage: If you think you have taken too much of this medicine contact a poison control center or emergency room at once. NOTE: This medicine is only for you. Do not share this medicine with others. What if  I miss a dose? It is important not to miss your dose. Call your doctor or health care professional if you are unable to keep an appointment. What may interact with this medicine? -certain antibiotics given by injection -cisplatin -cyclosporine -diuretics -foscarnet -medicines to increase blood counts like filgrastim, pegfilgrastim, sargramostim -vaccines This list may not describe all possible interactions. Give your health care provider a list of all the medicines, herbs, non-prescription drugs, or dietary supplements you use. Also tell them if you smoke, drink alcohol, or use illegal drugs. Some items may interact with your medicine. What should I watch for while using this medicine? Visit your doctor for checks on your progress. This drug may make you feel generally unwell. This is not uncommon,  as chemotherapy can affect healthy cells as well as cancer cells. Report any side effects. Continue your course of treatment even though you feel ill unless your doctor tells you to stop. Call your doctor or health care professional for advice if you get a fever, chills or sore throat, or other symptoms of a cold or flu. Do not treat yourself. This drug decreases your body's ability to fight infections. Try to avoid being around people who are sick. Avoid taking products that contain aspirin, acetaminophen, ibuprofen, naproxen, or ketoprofen unless instructed by your doctor. These medicines may hide a fever. Do not become pregnant while taking this medicine. Women should inform their doctor if they wish to become pregnant or think they might be pregnant. There is a potential for serious side effects to an unborn child. Talk to your health care professional or pharmacist for more information. Do not breast-feed an infant while taking this medicine. There is a maximum amount of this medicine you should receive throughout your life. The amount depends on the medical condition being treated and your overall health. Your doctor will watch how much of this medicine you receive in your lifetime. Tell your doctor if you have taken this medicine before. What side effects may I notice from receiving this medicine? Side effects that you should report to your doctor or health care professional as soon as possible: -allergic reactions like skin rash, itching or hives, swelling of the face, lips, or tongue -breathing problems -chest pain -confusion -cough -fast, irregular heartbeat -feeling faint or lightheaded, falls -fever or chills -mouth sores -pain, tingling, numbness in the hands or feet -trouble passing urine or change in the amount of urine -yellowing of the eyes or skin Side effects that usually do not require medical attention (report to your doctor or health care professional if they continue or are  bothersome): -darker skin color -hair loss -irritation at site where injected -loss of appetite -nail changes -nausea and vomiting -weight loss This list may not describe all possible side effects. Call your doctor for medical advice about side effects. You may report side effects to FDA at 1-800-FDA-1088. Where should I keep my medicine? This drug is given in a hospital or clinic and will not be stored at home. NOTE: This sheet is a summary. It may not cover all possible information. If you have questions about this medicine, talk to your doctor, pharmacist, or health care provider.  2018 Elsevier/Gold Standard (2012-11-05 09:36:48)  Dacarbazine, DTIC injection What is this medicine? DACARBAZINE (da KAR ba zeen) is a chemotherapy drug. This medicine is used to treat skin cancer. It is also used with other medicines to treat Hodgkin's disease. This medicine may be used for other purposes; ask your health care provider  or pharmacist if you have questions. COMMON BRAND NAME(S): DTIC-Dome What should I tell my health care provider before I take this medicine? They need to know if you have any of these conditions: -infection (especially virus infection such as chickenpox, cold sores, or herpes) -kidney disease -liver disease -low blood counts like low platelets, red blood cells, white blood cells -recent radiation therapy -an unusual or allergic reaction to dacarbazine, other chemotherapy agents, other medicines, foods, dyes, or preservatives -pregnant or trying to get pregnant -breast-feeding How should I use this medicine? This drug is given as an injection or infusion into a vein. It is administered in a hospital or clinic by a specially trained health care professional. Talk to your pediatrician regarding the use of this medicine in children. While this drug may be prescribed for selected conditions, precautions do apply. Overdosage: If you think you have taken too much of this  medicine contact a poison control center or emergency room at once. NOTE: This medicine is only for you. Do not share this medicine with others. What if I miss a dose? It is important not to miss your dose. Call your doctor or health care professional if you are unable to keep an appointment. What may interact with this medicine? -medicines to increase blood counts like filgrastim, pegfilgrastim, sargramostim -vaccines This list may not describe all possible interactions. Give your health care provider a list of all the medicines, herbs, non-prescription drugs, or dietary supplements you use. Also tell them if you smoke, drink alcohol, or use illegal drugs. Some items may interact with your medicine. What should I watch for while using this medicine? Your condition will be monitored carefully while you are receiving this medicine. You will need important blood work done while you are taking this medicine. This drug may make you feel generally unwell. This is not uncommon, as chemotherapy can affect healthy cells as well as cancer cells. Report any side effects. Continue your course of treatment even though you feel ill unless your doctor tells you to stop. Call your doctor or health care professional for advice if you get a fever, chills or sore throat, or other symptoms of a cold or flu. Do not treat yourself. This drug decreases your body's ability to fight infections. Try to avoid being around people who are sick. This medicine may increase your risk to bruise or bleed. Call your doctor or health care professional if you notice any unusual bleeding. Talk to your doctor about your risk of cancer. You may be more at risk for certain types of cancers if you take this medicine. Do not become pregnant while taking this medicine. Women should inform their doctor if they wish to become pregnant or think they might be pregnant. There is a potential for serious side effects to an unborn child. Talk to your  health care professional or pharmacist for more information. Do not breast-feed an infant while taking this medicine. What side effects may I notice from receiving this medicine? Side effects that you should report to your doctor or health care professional as soon as possible: -allergic reactions like skin rash, itching or hives, swelling of the face, lips, or tongue -low blood counts - this medicine may decrease the number of white blood cells, red blood cells and platelets. You may be at increased risk for infections and bleeding. -signs of infection - fever or chills, cough, sore throat, pain or difficulty passing urine -signs of decreased platelets or bleeding - bruising, pinpoint red  spots on the skin, black, tarry stools, blood in the urine -signs of decreased red blood cells - unusually weak or tired, fainting spells, lightheadedness -breathing problems -muscle pains -pain at site where injected -trouble passing urine or change in the amount of urine -vomiting -yellowing of the eyes or skin Side effects that usually do not require medical attention (report to your doctor or health care professional if they continue or are bothersome): -diarrhea -hair loss -loss of appetite -nausea -skin more sensitive to sun or ultraviolet light -stomach upset This list may not describe all possible side effects. Call your doctor for medical advice about side effects. You may report side effects to FDA at 1-800-FDA-1088. Where should I keep my medicine? This drug is given in a hospital or clinic and will not be stored at home. NOTE: This sheet is a summary. It may not cover all possible information. If you have questions about this medicine, talk to your doctor, pharmacist, or health care provider.  2018 Elsevier/Gold Standard (2015-09-10 15:17:39)

## 2018-07-05 NOTE — Progress Notes (Signed)
No MD visit today.  Per Dr. Irene Limbo, pt needs to be added to schedule.  MD to see in infusion.

## 2018-07-05 NOTE — Progress Notes (Signed)
HEMATOLOGY/ONCOLOGY CLINIC NOTE  Date of Service: ..07/05/2018   Patient Care Team: System, Pcp Not In as PCP - General  CHIEF COMPLAINTS/PURPOSE OF CONSULTATION:  Nodular sclerosis Hodgkin's Lymphoma   HISTORY OF PRESENTING ILLNESS:   David Lam is a wonderful 22 y.o. male who has been referred to [place] by Dr. 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 He is accompanied today by his mother and sister. The pt reports that he is doing well overall.   The pt presented to the ED on 05/07/18 after developing chest pain over 2 days, with positional elements. The pt was evaluated with a CXR which revealed a mediastinal mass that was biopsied on 05/10/18 and confirmed nodular sclerosis Hodgkin's lymphoma, as noted below.   The pt notes that he though his chest discomfort was due to working out, and denies noticing any swelling in the area. The pt denies any recent fevers, chills, night sweats, skin rashes, unexpected weight loss. The pt notes that his chest pain continues and is making his sleeping difficult. He denies any difficulty ambulating or SOB.   The pt reports that he has not had any prior medical problems, and has had left thumb and left ankle surgeries from injuries. 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   Of note prior to the patient's visit today, pt has had a Scal[place] lymph node biopsy completed on 05/10/18 with results revealing Nodular sclerosis Hodgkin's Lymphoma    The pt also had a CTA Chest on 05/07/18 which revealed Bulky, bilateral anterior superior mediastinal confluent soft tissue masses which are not calcified in appearance and which do not appear to cause occlusion or significant luminal narrowing of traversing vasculature. Leading consideration is lymphoma, possibly Hodgkin's. Given lack of differentiating soft tissue densities, a teratoma is believed less likely. Nonseminomatous germ cell tumor given presence of  small effusions might also be within differential though no pulmonary lesions are visualized. Metastatic disease, infection or inflammatory process are believed less likely.  The pt also had a CT A/P on 05/08/18 which revealed No acute finding or evidence of malignancy in the abdomen.  Most recent lab results (05/09/18) of CBC is as follows: all values are WNL except for WBC at 10.6k, HGB at 11.9, HCT at 34.7, MCV at 78.5, ANC at 7.9k, Eosinophils abs at 600. 05/08/18 Sed Rate at 19  On review of systems, pt reports chest pain, moving his bowels well, and denies skin rashes, fevers, chills, night sweats, unexpected weight loss, SOB, headaches, changes in vision, urinary pain or discomfort, abdominal pains, testicular pain or swelling, leg swelling, arm swelling, and any other symptoms.   On PMHx the pt reports left thumb and left ankle surgeries, denies blood transfusions. On Social Hx the pt denies alcohol consumption and denies smoking cigarettes or other recreational drugs. 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 Denies tattoos.  On Family Hx the pt denies any blood disorders or cancers.  He denies any medications.  Interval History:   David Lam returns today for management, evaluation and C2D1 ABVD treatment of his Classical Hodgkin's Lymphoma. The patient's last visit with [place] was on 06/21/18. He is accompanied today by his mother. The pt reports that he is doing well overall.   The pt reports that he has not developed any concerns in the interim. He is walking around without SOB, and is continuing to walk around. The pt notes that his breathing continues to improve overall.  He does note that he was wheezing a little bit a couple nights ago, and notes some mild positional elements to how he lies down to sleep. He denies any chest pain or chest tightness.   He notes that his energy levels have been stable and he denies any concerns for current or recent infections. He also  denies any mouth sores.   The pt notes that he was able to see a podiatrist as recommended, and his toenail was removed. He continues soaking it three times a day in water and epsom salts.  Lab results today (07/05/18) of CBC w/diff and CMP is as follows: all values are WNL except for WBC at 12.6k, HGB at 12.8, HCT at 37.4, MCV at 79.1, ANC at 10.5k, Monocytes abs at 1.1k, AST at 14. 07/05/18 Sed Rate is at 11  On review of systems, pt reports improved breathing, stable energy levels, moving his bowels well, and denies SOB when walking around, fevers, concerns for infections, fatigue, chest pain, chest tightness, mouth sores, abdominal pains, leg swelling, and any other symptoms.    MEDICAL HISTORY:  No past medical history on file.  SURGICAL HISTORY: Past Surgical History:  Procedure Laterality Date  . ANKLE SURGERY Left    cleaned up   . FRACTURE SURGERY Left    arm  . IR IMAGING GUIDED PORT INSERTION  06/03/2018  . MEDIASTINOTOMY CHAMBERLAIN MCNEIL N/A 05/10/2018   Procedure: Mediastinal mass biopsy ;  Surgeon: Grace Isaac, MD;  Location: [facility] OR;  Service: Thoracic;  Laterality: N/A;  . ORIF FINGER / THUMB FRACTURE Left     SOCIAL HISTORY: Social History   Socioeconomic History  . Marital status: Unknown    Spouse name: Not on file  . Number of children: Not on file  . Years of education: Not on file  . Highest education level: Not on file  Occupational History  . Not on file  Social Needs  . Financial resource strain: Not on file  . Food insecurity:    Worry: Not on file    Inability: Not on file  . Transportation needs:    Medical: Not on file    Non-medical: Not on file  Tobacco Use  . Smoking status: Never Smoker  . Smokeless tobacco: Never Used  Substance and Sexual Activity  . Alcohol use: Never    Frequency: Never  . Drug use: Never  . Sexual activity: Not on file  Lifestyle  . Physical activity:    Days per week: Not on file    Minutes per  session: Not on file  . Stress: Not on file  Relationships  . Social connections:    Talks on phone: Not on file    Gets together: Not on file    Attends religious service: Not on file    Active member of club or organization: Not on file    Attends meetings of clubs or organizations: Not on file    Relationship status: Not on file  . Intimate partner violence:    Fear of current or ex partner: Not on file    Emotionally abused: Not on file    Physically abused: Not on file    Forced sexual activity: Not on file  Other Topics Concern  . Not on file  Social History Narrative  . Not on file    FAMILY HISTORY: No family history on file.  ALLERGIES:  has No Known Allergies.  MEDICATIONS:  Current Outpatient Medications  Medication Sig  Dispense Refill  . cephALEXin (KEFLEX) 500 MG capsule Take 1 capsule (500 mg total) by mouth 3 (three) times daily. 21 capsule 0  . diphenhydrAMINE (BENADRYL) 25 mg capsule Take 25 mg by mouth as needed.    . lidocaine-prilocaine (EMLA) cream Apply 1 application topically as needed. 30 g 0  . prochlorperazine (COMPAZINE) 10 MG tablet Take 1 tablet (10 mg total) by mouth every 6 (six) hours as needed for nausea or vomiting. 30 tablet 0   No current facility-administered medications for this visit.    Facility-Administered Medications Ordered in Other Visits  Medication Dose Route Frequency Provider Last Rate Last Dose  . bleomycin (BLEOCIN) 20 Units in sodium chloride 0.9 % 50 mL chemo infusion  10 Units/m2 (Treatment Plan Recorded) Intravenous Once [name], MD      . dacarbazine (DTIC) 750 mg in sodium chloride 0.9 % 250 mL chemo infusion  375 mg/m2 (Treatment Plan Recorded) Intravenous Once [name], MD      . DOXOrubicin (ADRIAMYCIN) chemo injection 50 mg  25 mg/m2 (Treatment Plan Recorded) Intravenous Once [name], MD      . heparin lock flush 100 unit/mL  500 Units Intracatheter Once PRN [name], MD      . sodium chloride flush (NS) 0.9 % injection 10 mL  10 mL Intracatheter PRN [name], MD      . vinBLAStine (VELBAN) 12 mg in sodium chloride 0.9 % 50 mL chemo infusion  6.02 mg/m2 (Treatment Plan Recorded) Intravenous Once [name], MD        REVIEW OF SYSTEMS:    A 10+ POINT REVIEW OF SYSTEMS WAS OBTAINED including neurology, dermatology, psychiatry, cardiac, respiratory, lymph, extremities, GI, GU, Musculoskeletal, constitutional, breasts, reproductive, HEENT.  All pertinent positives are noted in the HPI.  All others are negative.    PHYSICAL EXAMINATION: ECOG PERFORMANCE STATUS: 1 - Symptomatic but completely ambulatory  . There were no vitals filed for this visit. There were no vitals filed for this visit. .There is no height or weight on file to calculate BMI.  GENERAL:alert, in no acute distress and comfortable SKIN: no acute rashes, no significant lesions EYES: conjunctiva are pink and non-injected, sclera anicteric OROPHARYNX: MMM, no exudates, no oropharyngeal erythema or ulceration NECK: supple, no JVD LYMPH:  no palpable lymphadenopathy in the cervical, axillary or inguinal regions LUNGS: clear to auscultation b/l with normal respiratory effort HEART: regular rate & rhythm ABDOMEN:  normoactive bowel sounds , non tender, not distended. No palpable hepatosplenomegaly.  Extremity: no pedal edema PSYCH: alert & oriented x 3 with fluent speech NEURO: no focal motor/sensory deficits   LABORATORY DATA:  I have reviewed the data as listed  . CBC Latest Ref Rng & Units 07/05/2018 06/21/2018 06/11/2018  WBC 4.0 - 10.5 K/uL 12.6(H) 8.4 7.6  Hemoglobin 13.0 - 17.0 g/dL 12.8(L) 12.8(L) 13.2  Hematocrit 39.0 - 52.0 % 37.4(L) 37.8(L) 39.1  Platelets 150 - 400 K/uL 316 438(H) 265    . CMP Latest Ref Rng & Units 07/05/2018 06/21/2018 06/11/2018  Glucose 70 - 99 mg/dL 98 93 88  BUN 6 - 20 mg/dL 16 12 13   Creatinine 0.61 - 1.24 mg/dL  1.00 0.86 0.90  Sodium 135 - 145 mmol/L 140 139 137  Potassium 3.5 - 5.1 mmol/L 3.7 4.1 4.5  Chloride 98 - 111 mmol/L 104 106 101  CO2 22 - 32 mmol/L 27 25 28   Calcium 8.9 - 10.3 mg/dL 9.3 9.4  9.9  Total Protein 6.5 - 8.1 g/dL 7.7 7.9 8.3(H)  Total Bilirubin 0.3 - 1.2 mg/dL 0.4 0.6 0.5  Alkaline Phos 38 - 126 U/L 90 94 91  AST 15 - 41 U/L 14(L) 14(L) 14(L)  ALT 0 - 44 U/L 21 21 30    05/10/18 Biopsy:   05/22/18 BM Bx:    RADIOGRAPHIC STUDIES: I have personally reviewed the radiological images as listed and agreed with the findings in the report. No results found.  ASSESSMENT & PLAN:   22 y.o. male with  1. 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 Leading consideration is lymphoma, possibly Hodgkin's. Given lack of differentiating soft tissue densities, a teratoma is believed less likely. Nonseminomatous germ cell tumor given presence of small effusions might also be within differential though no pulmonary lesions are visualized. Metastatic disease, infection or inflammatory process are believed less likely.    05/08/18 CT A/P revealed No acute finding or evidence of malignancy in the abdomen.  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 No hypermetabolic lymph nodes within the neck, abdomen or pelvis.  05/22/18 BM Bx was negative   PLAN:   -Discussed pt labwork today, 07/05/18; HGB stable at 12.8, ANC at 10.5k, normal PLT at  316k, chemistries remain stable -07/05/18 Sed Rate is normal at 64mm/hr -The pt has no prohibitive toxicities from continuing C2D1 ABVD at this time.   -Avoid uncooked or raw foods, and recommended avoiding crowds and individuals with infections -Continue Vitamin B complex -Recommend salt and baking soda mouthwashes for moutrh soreness -Will see the pt back on C3D1 in 4 weeks  2. B/L great toe subungal hematomas - likely from chemotherapy -podiatry referral given -Patient saw Podiatry, and toe nail was removed -Continue Epsom salt soaks   F/u for C2D15 in 2 weeks with labs Please schedule C3 of treatment with labs and port flush with each labs RTC with Dr Irene Limbo with C3D1 of treatment in 4weeks   All of the patients questions were answered with apparent satisfaction. The patient knows to call the clinic with any problems, questions or concerns.  The total time spent in the appt was 25 minutes and more than 50% was on counseling and direct patient cares.    Sullivan Lone MD MS AAHIVMS [facility] Hematology/Oncology Physician [facility]  (Office):       [phone] (Work cell):  [phone] (Fax):           [phone]  07/05/2018 1:57 PM   I, Baldwin [place], am acting as a scribe for Dr. Sullivan Lone.   .I have reviewed the above documentation for accuracy and completeness, and I agree with the above. [name] MD

## 2018-07-09 ENCOUNTER — Telehealth: Payer: Self-pay

## 2018-07-09 NOTE — Telephone Encounter (Signed)
Error opening  

## 2018-07-10 ENCOUNTER — Telehealth: Payer: Self-pay

## 2018-07-10 NOTE — Telephone Encounter (Signed)
Per Irene Limbo verbally said no provider on the visit of 12/27 just. Per 12/18 follow up (to discuss what is the curse of action)

## 2018-07-19 ENCOUNTER — Inpatient Hospital Stay: Payer: BLUE CROSS/BLUE SHIELD

## 2018-07-19 ENCOUNTER — Other Ambulatory Visit: Payer: Self-pay | Admitting: *Deleted

## 2018-07-19 VITALS — BP 120/88 | HR 98 | Temp 98.2°F | Resp 16 | Wt 192.0 lb

## 2018-07-19 DIAGNOSIS — Z5111 Encounter for antineoplastic chemotherapy: Secondary | ICD-10-CM | POA: Diagnosis not present

## 2018-07-19 DIAGNOSIS — C8110 Nodular sclerosis classical Hodgkin lymphoma, unspecified site: Secondary | ICD-10-CM

## 2018-07-19 DIAGNOSIS — Z95828 Presence of other vascular implants and grafts: Secondary | ICD-10-CM

## 2018-07-19 LAB — CBC WITH DIFFERENTIAL/PLATELET
Abs Immature Granulocytes: 0.04 10*3/uL (ref 0.00–0.07)
Basophils Absolute: 0.1 10*3/uL (ref 0.0–0.1)
Basophils Relative: 1 %
Eosinophils Absolute: 0.4 10*3/uL (ref 0.0–0.5)
Eosinophils Relative: 3 %
HCT: 39.1 % (ref 39.0–52.0)
Hemoglobin: 13.2 g/dL (ref 13.0–17.0)
Immature Granulocytes: 0 %
Lymphocytes Relative: 7 %
Lymphs Abs: 0.8 10*3/uL (ref 0.7–4.0)
MCH: 26.7 pg (ref 26.0–34.0)
MCHC: 33.8 g/dL (ref 30.0–36.0)
MCV: 79 fL — ABNORMAL LOW (ref 80.0–100.0)
Monocytes Absolute: 1 10*3/uL (ref 0.1–1.0)
Monocytes Relative: 9 %
NRBC: 0 % (ref 0.0–0.2)
Neutro Abs: 9.3 10*3/uL — ABNORMAL HIGH (ref 1.7–7.7)
Neutrophils Relative %: 80 %
Platelets: 365 10*3/uL (ref 150–400)
RBC: 4.95 MIL/uL (ref 4.22–5.81)
RDW: 15.9 % — ABNORMAL HIGH (ref 11.5–15.5)
WBC: 11.6 10*3/uL — ABNORMAL HIGH (ref 4.0–10.5)

## 2018-07-19 LAB — CMP (CANCER CENTER ONLY)
ALT: 31 U/L (ref 0–44)
AST: 15 U/L (ref 15–41)
Albumin: 3.9 g/dL (ref 3.5–5.0)
Alkaline Phosphatase: 93 U/L (ref 38–126)
Anion gap: 6 (ref 5–15)
BUN: 10 mg/dL (ref 6–20)
CALCIUM: 9.5 mg/dL (ref 8.9–10.3)
CO2: 29 mmol/L (ref 22–32)
Chloride: 104 mmol/L (ref 98–111)
Creatinine: 1.03 mg/dL (ref 0.61–1.24)
GFR, Est AFR Am: >60 mL/min (ref >60)
Glucose, Bld: 109 mg/dL — ABNORMAL HIGH (ref 70–99)
Potassium: 4.2 mmol/L (ref 3.5–5.1)
Sodium: 139 mmol/L (ref 135–145)
TOTAL PROTEIN: 8 g/dL (ref 6.5–8.1)
Total Bilirubin: 0.4 mg/dL (ref 0.3–1.2)

## 2018-07-19 MED ORDER — SODIUM CHLORIDE 0.9 % IV SOLN
Freq: Once | INTRAVENOUS | Status: AC
  Administered 2018-07-19: 13:00:00 via INTRAVENOUS
  Filled 2018-07-19: qty 5

## 2018-07-19 MED ORDER — SODIUM CHLORIDE 0.9 % IV SOLN
375.0000 mg/m2 | Freq: Once | INTRAVENOUS | Status: AC
  Administered 2018-07-19: 750 mg via INTRAVENOUS
  Filled 2018-07-19: qty 75

## 2018-07-19 MED ORDER — PALONOSETRON HCL INJECTION 0.25 MG/5ML
INTRAVENOUS | Status: AC
  Filled 2018-07-19: qty 5

## 2018-07-19 MED ORDER — PROCHLORPERAZINE MALEATE 10 MG PO TABS: 10.0000 mg | ORAL_TABLET | Freq: Four times a day (QID) | ORAL | 1 refills | Status: DC | PRN

## 2018-07-19 MED ORDER — SODIUM CHLORIDE 0.9 % IV SOLN
Freq: Once | INTRAVENOUS | Status: AC
  Administered 2018-07-19: 13:00:00 via INTRAVENOUS
  Filled 2018-07-19: qty 250

## 2018-07-19 MED ORDER — VINBLASTINE SULFATE CHEMO INJECTION 1 MG/ML
6.0500 mg/m2 | Freq: Once | INTRAVENOUS | Status: AC
  Administered 2018-07-19: 12 mg via INTRAVENOUS
  Filled 2018-07-19: qty 12

## 2018-07-19 MED ORDER — HEPARIN SOD (PORK) LOCK FLUSH 100 UNIT/ML IV SOLN
500.0000 [IU] | Freq: Once | INTRAVENOUS | Status: AC | PRN
  Administered 2018-07-19: 500 [IU]
  Filled 2018-07-19: qty 5

## 2018-07-19 MED ORDER — SODIUM CHLORIDE 0.9 % IV SOLN
10.0000 [IU]/m2 | Freq: Once | INTRAVENOUS | Status: AC
  Administered 2018-07-19: 20 [IU] via INTRAVENOUS
  Filled 2018-07-19: qty 6.67

## 2018-07-19 MED ORDER — SODIUM CHLORIDE 0.9% FLUSH
10.0000 mL | INTRAVENOUS | Status: DC | PRN
  Administered 2018-07-19: 10 mL
  Filled 2018-07-19: qty 10

## 2018-07-19 MED ORDER — PALONOSETRON HCL INJECTION 0.25 MG/5ML
0.2500 mg | Freq: Once | INTRAVENOUS | Status: AC
  Administered 2018-07-19: 0.25 mg via INTRAVENOUS

## 2018-07-19 MED ORDER — DOXORUBICIN HCL CHEMO IV INJECTION 2 MG/ML
25.0000 mg/m2 | Freq: Once | INTRAVENOUS | Status: AC
  Administered 2018-07-19: 50 mg via INTRAVENOUS
  Filled 2018-07-19: qty 25

## 2018-07-19 NOTE — Patient Instructions (Signed)
[facility] Discharge Instructions for Patients Receiving Chemotherapy  Today you received the following chemotherapy agents:  Doxorubicin, Venblastine, Bleomycin, Dacarbezine  To help prevent nausea and vomiting after your treatment, we encourage you to take your nausea medication as prescribed.   If you develop nausea and vomiting that is not controlled by your nausea medication, call the clinic.   BELOW ARE SYMPTOMS THAT SHOULD BE REPORTED IMMEDIATELY:  *FEVER GREATER THAN 100.5 F  *CHILLS WITH OR WITHOUT FEVER  NAUSEA AND VOMITING THAT IS NOT CONTROLLED WITH YOUR NAUSEA MEDICATION  *UNUSUAL SHORTNESS OF BREATH  *UNUSUAL BRUISING OR BLEEDING  TENDERNESS IN MOUTH AND THROAT WITH OR WITHOUT PRESENCE OF ULCERS  *URINARY PROBLEMS  *BOWEL PROBLEMS  UNUSUAL RASH Items with * indicate a potential emergency and should be followed up as soon as possible.  Feel free to call the clinic should you have any questions or concerns. The clinic phone number is [phone].  Please show the [place] at check-in to the Emergency Department and triage nurse.

## 2018-07-19 NOTE — Telephone Encounter (Signed)
Patient in infusion - requested refill of Compazine. Refill completed

## 2018-07-30 NOTE — Progress Notes (Signed)
HEMATOLOGY/ONCOLOGY CLINIC NOTE  Date of Service: 07/31/18     Patient Care Team: System, Pcp Not In as PCP - General  CHIEF COMPLAINTS/PURPOSE OF CONSULTATION:  Nodular sclerosis Hodgkin's Lymphoma   HISTORY OF PRESENTING ILLNESS:   David Lam is a wonderful 23 y.o. male who has been referred to [place] by Dr. 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 He is accompanied today by his mother and sister. The pt reports that he is doing well overall.   The pt presented to the ED on 05/07/18 after developing chest pain over 2 days, with positional elements. The pt was evaluated with a CXR which revealed a mediastinal mass that was biopsied on 05/10/18 and confirmed nodular sclerosis Hodgkin's lymphoma, as noted below.   The pt notes that he though his chest discomfort was due to working out, and denies noticing any swelling in the area. The pt denies any recent fevers, chills, night sweats, skin rashes, unexpected weight loss. The pt notes that his chest pain continues and is making his sleeping difficult. He denies any difficulty ambulating or SOB.   The pt reports that he has not had any prior medical problems, and has had left thumb and left ankle surgeries from injuries. 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   Of note prior to the patient's visit today, pt has had a [place] lymph node biopsy completed on 05/10/18 with results revealing Nodular sclerosis Hodgkin's Lymphoma    The pt also had a CTA Chest on 05/07/18 which revealed Bulky, bilateral anterior superior mediastinal confluent soft tissue masses which are not calcified in appearance and which do not appear to cause occlusion or significant luminal narrowing of traversing vasculature. Leading consideration is lymphoma, possibly Hodgkin's. Given lack of differentiating soft tissue densities, a teratoma is believed less likely. Nonseminomatous germ cell tumor given presence of  small effusions might also be within differential though no pulmonary lesions are visualized. Metastatic disease, infection or inflammatory process are believed less likely.  The pt also had a CT A/P on 05/08/18 which revealed No acute finding or evidence of malignancy in the abdomen.  Most recent lab results (05/09/18) of CBC is as follows: all values are WNL except for WBC at 10.6k, HGB at 11.9, HCT at 34.7, MCV at 78.5, ANC at 7.9k, Eosinophils abs at 600. 05/08/18 Sed Rate at 19  On review of systems, pt reports chest pain, moving his bowels well, and denies skin rashes, fevers, chills, night sweats, unexpected weight loss, SOB, headaches, changes in vision, urinary pain or discomfort, abdominal pains, testicular pain or swelling, leg swelling, arm swelling, and any other symptoms.   On PMHx the pt reports left thumb and left ankle surgeries, denies blood transfusions. On Social Hx the pt denies alcohol consumption and denies smoking cigarettes or other recreational drugs. 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 Denies tattoos.  On Family Hx the pt denies any blood disorders or cancers.  He denies any medications.  Interval History:   David Lam returns today for management, evaluation and prior to C3D1 ABVD treatment of his Classical Hodgkin's Lymphoma. The patient's last visit with [place] was on 07/05/18.. The pt reports that he is doing well overall.   The pt reports that he is no longer having any toe pains. He has continued to apply Neosporin, and notes that the previously removed toe nail from his right great toe is growing back.   The pt  denies any persisting cough, and denies any SOB or difficulty breathing. He denies fevers, chills, night sweats, and sore throat.   The pt notes that he has been sleeping well, eating well, and has continued remaining very active with going to the gym three times a week.   He notes that he wakes up in the morning with some mild  sensations of chest tightness and is not sure if he is "sleeping wrong or not." He notes that the feeling improves with stretching, and he denies any tenderness to palpation and denies sensations of pressure.   The pt denies any abdominal pain or problems with the port.   Lab results today (07/31/18) of CBC w/diff is as follows: all values are WNL except for WBC at 12.0k, HCT at 38.3, MCV at 79.0, RDW at 15.9, ANC at 9.9k. 07/31/18 CMP is pending   On review of systems, pt reports good energy levels, staying active, eating well, and denies difficulty breathing, SOB, persisting cough, sore throat, fevers, chills, night sweats, CP, concerns of infections, abdominal pains, problems with the port, and any other symptoms.   MEDICAL HISTORY:  No past medical history on file.  SURGICAL HISTORY: Past Surgical History:  Procedure Laterality Date  . ANKLE SURGERY Left    cleaned up   . FRACTURE SURGERY Left    arm  . IR IMAGING GUIDED PORT INSERTION  06/03/2018  . MEDIASTINOTOMY CHAMBERLAIN MCNEIL N/A 05/10/2018   Procedure: Mediastinal mass biopsy ;  Surgeon: Grace Isaac, MD;  Location: [facility] OR;  Service: Thoracic;  Laterality: N/A;  . ORIF FINGER / THUMB FRACTURE Left     SOCIAL HISTORY: Social History   Socioeconomic History  . Marital status: Unknown    Spouse name: Not on file  . Number of children: Not on file  . Years of education: Not on file  . Highest education level: Not on file  Occupational History  . Not on file  Social Needs  . Financial resource strain: Not on file  . Food insecurity:    Worry: Not on file    Inability: Not on file  . Transportation needs:    Medical: Not on file    Non-medical: Not on file  Tobacco Use  . Smoking status: Never Smoker  . Smokeless tobacco: Never Used  Substance and Sexual Activity  . Alcohol use: Never    Frequency: Never  . Drug use: Never  . Sexual activity: Not on file  Lifestyle  . Physical activity:    Days per  week: Not on file    Minutes per session: Not on file  . Stress: Not on file  Relationships  . Social connections:    Talks on phone: Not on file    Gets together: Not on file    Attends religious service: Not on file    Active member of club or organization: Not on file    Attends meetings of clubs or organizations: Not on file    Relationship status: Not on file  . Intimate partner violence:    Fear of current or ex partner: Not on file    Emotionally abused: Not on file    Physically abused: Not on file    Forced sexual activity: Not on file  Other Topics Concern  . Not on file  Social History Narrative  . Not on file    FAMILY HISTORY: No family history on file.  ALLERGIES:  has No Known Allergies.  MEDICATIONS:  Current  Outpatient Medications  Medication Sig Dispense Refill  . cephALEXin (KEFLEX) 500 MG capsule Take 1 capsule (500 mg total) by mouth 3 (three) times daily. 21 capsule 0  . diphenhydrAMINE (BENADRYL) 25 mg capsule Take 25 mg by mouth as needed.    . lidocaine-prilocaine (EMLA) cream Apply 1 application topically as needed. 30 g 0  . prochlorperazine (COMPAZINE) 10 MG tablet Take 1 tablet (10 mg total) by mouth every 6 (six) hours as needed for nausea or vomiting. 30 tablet 1   No current facility-administered medications for this visit.     REVIEW OF SYSTEMS:    A 10+ POINT REVIEW OF SYSTEMS WAS OBTAINED including neurology, dermatology, psychiatry, cardiac, respiratory, lymph, extremities, GI, GU, Musculoskeletal, constitutional, breasts, reproductive, HEENT.  All pertinent positives are noted in the HPI.  All others are negative.    PHYSICAL EXAMINATION: ECOG PERFORMANCE STATUS: 1 - Symptomatic but completely ambulatory  . Vitals:   07/31/18 1135  BP: 132/77  Pulse: 100  Resp: 18  Temp: (!) 97.5 F (36.4 C)  SpO2: 100%   Filed Weights   07/31/18 1135  Weight: 191 lb 4.8 oz (86.8 kg)   .Body mass index is 29.09 kg/m.  GENERAL:alert, in  no acute distress and comfortable SKIN: no acute rashes, no significant lesions EYES: conjunctiva are pink and non-injected, sclera anicteric OROPHARYNX: MMM, no exudates, no oropharyngeal erythema or ulceration NECK: supple, no JVD LYMPH:  no palpable lymphadenopathy in the cervical, axillary or inguinal regions LUNGS: clear to auscultation b/l with normal respiratory effort HEART: regular rate & rhythm ABDOMEN:  normoactive bowel sounds , non tender, not distended. No palpable hepatosplenomegaly.  Extremity: no pedal edema PSYCH: alert & oriented x 3 with fluent speech NEURO: no focal motor/sensory deficits   LABORATORY DATA:  I have reviewed the data as listed  . CBC Latest Ref Rng & Units 07/31/2018 07/19/2018 07/05/2018  WBC 4.0 - 10.5 K/uL 12.0(H) 11.6(H) 12.6(H)  Hemoglobin 13.0 - 17.0 g/dL 13.1 13.2 12.8(L)  Hematocrit 39.0 - 52.0 % 38.3(L) 39.1 37.4(L)  Platelets 150 - 400 K/uL 395 365 316    . CMP Latest Ref Rng & Units 07/19/2018 07/05/2018 06/21/2018  Glucose 70 - 99 mg/dL 109(H) 98 93  BUN 6 - 20 mg/dL 10 16 12   Creatinine 0.61 - 1.24 mg/dL 1.03 1.00 0.86  Sodium 135 - 145 mmol/L 139 140 139  Potassium 3.5 - 5.1 mmol/L 4.2 3.7 4.1  Chloride 98 - 111 mmol/L 104 104 106  CO2 22 - 32 mmol/L 29 27 25   Calcium 8.9 - 10.3 mg/dL 9.5 9.3 9.4  Total Protein 6.5 - 8.1 g/dL 8.0 7.7 7.9  Total Bilirubin 0.3 - 1.2 mg/dL 0.4 0.4 0.6  Alkaline Phos 38 - 126 U/L 93 90 94  AST 15 - 41 U/L 15 14(L) 14(L)  ALT 0 - 44 U/L 31 21 21    05/10/18 Biopsy:   05/22/18 BM Bx:    RADIOGRAPHIC STUDIES: I have personally reviewed the radiological images as listed and agreed with the findings in the report. No results found.  ASSESSMENT & PLAN:   23 y.o. male with  1. 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 Leading consideration is lymphoma, possibly Hodgkin's. Given lack of differentiating soft tissue densities, a teratoma is  believed less likely. Nonseminomatous germ cell tumor given presence of small effusions might also be within differential though no pulmonary lesions are visualized. Metastatic disease, infection or inflammatory process are believed less likely.    05/08/18 CT A/P revealed No acute finding or evidence of malignancy in the abdomen.  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 No hypermetabolic lymph nodes within the neck, abdomen or pelvis.  05/22/18 BM Bx was negative   2. B/L great toe subungal hematomas - likely from chemotherapy -podiatry referral given -Patient saw Podiatry, and toe nail was removed -Continue Epsom salt soaks   PLAN:  -Discussed pt labwork today, 07/31/18;labs stable -The pt has no prohibitive toxicities from continuing C3D1 ABVD at this time.   -Discussed that we will be dropping Bleomycin after the conclusion of this cycle or infusion, to reduce risk of pulmonary toxicities. pt is not showing any symptoms of acute toxicities. Will also finalize this decision after obtaining repeat PET/CT. -Will schedule PET/CT for completion after C3 -Recommended that the pt continue to eat well, drink at least 48-64 oz of water each day, and walk 20-30 minutes each day.   -Continue with stretching exercises -Continue with infection prevention strategies, which the pt  -Continue applying Neosporin to toe nail beds  -Avoid uncooked or raw foods, and recommended avoiding crowds and individuals  with infections -Continue Vitamin B complex -Recommend salt and baking soda mouthwashes to prevent mouth sores  -Will see the pt back in 2 weeks    PET/CT in 10 days RTC for next treatment as scheduled on 08/14/2018 with labs/port flush, MD visit and chemotherapy    All of the patients questions were answered with apparent satisfaction. The patient knows to call the clinic with any problems, questions or concerns.  The total time spent in the appt was 30 minutes and more than 50% was on counseling and direct patient cares.    Sullivan Lone MD MS AAHIVMS [facility] Hematology/Oncology Physician [facility]  (Office):       [phone] (Work cell):  [phone] (Fax):           [phone]  07/31/2018 12:10 PM   I, Baldwin [place], am acting as a scribe for Dr. Sullivan Lone.   .I have reviewed the above documentation for accuracy and completeness, and I agree with the above. [name] MD

## 2018-07-31 ENCOUNTER — Inpatient Hospital Stay: Payer: BLUE CROSS/BLUE SHIELD

## 2018-07-31 ENCOUNTER — Inpatient Hospital Stay: Payer: BLUE CROSS/BLUE SHIELD | Attending: Hematology

## 2018-07-31 ENCOUNTER — Telehealth: Payer: Self-pay | Admitting: Hematology

## 2018-07-31 ENCOUNTER — Inpatient Hospital Stay (HOSPITAL_BASED_OUTPATIENT_CLINIC_OR_DEPARTMENT_OTHER): Payer: BLUE CROSS/BLUE SHIELD | Admitting: Hematology

## 2018-07-31 VITALS — BP 132/77 | HR 100 | Temp 97.5°F | Resp 18 | Ht 68.0 in | Wt 191.3 lb

## 2018-07-31 DIAGNOSIS — C8118 Nodular sclerosis classical Hodgkin lymphoma, lymph nodes of multiple sites: Secondary | ICD-10-CM | POA: Insufficient documentation

## 2018-07-31 DIAGNOSIS — Z5111 Encounter for antineoplastic chemotherapy: Secondary | ICD-10-CM | POA: Diagnosis present

## 2018-07-31 DIAGNOSIS — C8110 Nodular sclerosis classical Hodgkin lymphoma, unspecified site: Secondary | ICD-10-CM

## 2018-07-31 DIAGNOSIS — Z95828 Presence of other vascular implants and grafts: Secondary | ICD-10-CM

## 2018-07-31 LAB — CMP (CANCER CENTER ONLY)
ALT: 31 U/L (ref 0–44)
AST: 16 U/L (ref 15–41)
Albumin: 3.9 g/dL (ref 3.5–5.0)
Alkaline Phosphatase: 84 U/L (ref 38–126)
Anion gap: 10 (ref 5–15)
BUN: 16 mg/dL (ref 6–20)
CO2: 26 mmol/L (ref 22–32)
Calcium: 9.4 mg/dL (ref 8.9–10.3)
Chloride: 101 mmol/L (ref 98–111)
Creatinine: 1.02 mg/dL (ref 0.61–1.24)
GFR, Estimated: >60 mL/min (ref >60)
Glucose, Bld: 123 mg/dL — ABNORMAL HIGH (ref 70–99)
Potassium: 3.8 mmol/L (ref 3.5–5.1)
Sodium: 137 mmol/L (ref 135–145)
Total Bilirubin: 0.6 mg/dL (ref 0.3–1.2)
Total Protein: 7.9 g/dL (ref 6.5–8.1)

## 2018-07-31 LAB — CBC WITH DIFFERENTIAL/PLATELET
Abs Immature Granulocytes: 0.05 10*3/uL (ref 0.00–0.07)
BASOS ABS: 0.1 10*3/uL (ref 0.0–0.1)
Basophils Relative: 0 %
Eosinophils Absolute: 0.3 10*3/uL (ref 0.0–0.5)
Eosinophils Relative: 2 %
HCT: 38.3 % — ABNORMAL LOW (ref 39.0–52.0)
Hemoglobin: 13.1 g/dL (ref 13.0–17.0)
Immature Granulocytes: 0 %
Lymphocytes Relative: 7 %
Lymphs Abs: 0.8 10*3/uL (ref 0.7–4.0)
MCH: 27 pg (ref 26.0–34.0)
MCHC: 34.2 g/dL (ref 30.0–36.0)
MCV: 79 fL — ABNORMAL LOW (ref 80.0–100.0)
Monocytes Absolute: 0.9 10*3/uL (ref 0.1–1.0)
Monocytes Relative: 7 %
NRBC: 0 % (ref 0.0–0.2)
Neutro Abs: 9.9 10*3/uL — ABNORMAL HIGH (ref 1.7–7.7)
Neutrophils Relative %: 84 %
Platelets: 395 10*3/uL (ref 150–400)
RBC: 4.85 MIL/uL (ref 4.22–5.81)
RDW: 15.9 % — ABNORMAL HIGH (ref 11.5–15.5)
WBC: 12 10*3/uL — ABNORMAL HIGH (ref 4.0–10.5)

## 2018-07-31 MED ORDER — SODIUM CHLORIDE 0.9 % IV SOLN
375.0000 mg/m2 | Freq: Once | INTRAVENOUS | Status: AC
  Administered 2018-07-31: 750 mg via INTRAVENOUS
  Filled 2018-07-31: qty 75

## 2018-07-31 MED ORDER — SODIUM CHLORIDE 0.9 % IV SOLN
10.0000 [IU]/m2 | Freq: Once | INTRAVENOUS | Status: AC
  Administered 2018-07-31: 20 [IU] via INTRAVENOUS
  Filled 2018-07-31: qty 6.67

## 2018-07-31 MED ORDER — PALONOSETRON HCL INJECTION 0.25 MG/5ML
0.2500 mg | Freq: Once | INTRAVENOUS | Status: AC
  Administered 2018-07-31: 0.25 mg via INTRAVENOUS

## 2018-07-31 MED ORDER — SODIUM CHLORIDE 0.9 % IV SOLN
Freq: Once | INTRAVENOUS | Status: AC
  Administered 2018-07-31: 13:00:00 via INTRAVENOUS
  Filled 2018-07-31: qty 250

## 2018-07-31 MED ORDER — PALONOSETRON HCL INJECTION 0.25 MG/5ML
INTRAVENOUS | Status: AC
  Filled 2018-07-31: qty 5

## 2018-07-31 MED ORDER — HEPARIN SOD (PORK) LOCK FLUSH 100 UNIT/ML IV SOLN
500.0000 [IU] | Freq: Once | INTRAVENOUS | Status: AC | PRN
  Administered 2018-07-31: 500 [IU]
  Filled 2018-07-31: qty 5

## 2018-07-31 MED ORDER — SODIUM CHLORIDE 0.9% FLUSH
10.0000 mL | INTRAVENOUS | Status: DC | PRN
  Administered 2018-07-31: 10 mL
  Filled 2018-07-31: qty 10

## 2018-07-31 MED ORDER — DOXORUBICIN HCL CHEMO IV INJECTION 2 MG/ML
25.0000 mg/m2 | Freq: Once | INTRAVENOUS | Status: AC
  Administered 2018-07-31: 50 mg via INTRAVENOUS
  Filled 2018-07-31: qty 25

## 2018-07-31 MED ORDER — SODIUM CHLORIDE 0.9 % IV SOLN
Freq: Once | INTRAVENOUS | Status: AC
  Administered 2018-07-31: 13:00:00 via INTRAVENOUS
  Filled 2018-07-31: qty 5

## 2018-07-31 MED ORDER — VINBLASTINE SULFATE CHEMO INJECTION 1 MG/ML
6.0500 mg/m2 | Freq: Once | INTRAVENOUS | Status: AC
  Administered 2018-07-31: 12 mg via INTRAVENOUS
  Filled 2018-07-31: qty 12

## 2018-07-31 NOTE — Telephone Encounter (Signed)
Printed calendar and avs.  Okay per MD to start treatments on Friday in Feb.

## 2018-07-31 NOTE — Patient Instructions (Signed)
[facility] Discharge Instructions for Patients Receiving Chemotherapy  Today you received the following chemotherapy agents:  Doxorubicin, Venblastine, Bleomycin, Dacarbezine  To help prevent nausea and vomiting after your treatment, we encourage you to take your nausea medication as prescribed.   If you develop nausea and vomiting that is not controlled by your nausea medication, call the clinic.   BELOW ARE SYMPTOMS THAT SHOULD BE REPORTED IMMEDIATELY:  *FEVER GREATER THAN 100.5 F  *CHILLS WITH OR WITHOUT FEVER  NAUSEA AND VOMITING THAT IS NOT CONTROLLED WITH YOUR NAUSEA MEDICATION  *UNUSUAL SHORTNESS OF BREATH  *UNUSUAL BRUISING OR BLEEDING  TENDERNESS IN MOUTH AND THROAT WITH OR WITHOUT PRESENCE OF ULCERS  *URINARY PROBLEMS  *BOWEL PROBLEMS  UNUSUAL RASH Items with * indicate a potential emergency and should be followed up as soon as possible.  Feel free to call the clinic should you have any questions or concerns. The clinic phone number is [phone].  Please show the [place] at check-in to the Emergency Department and triage nurse.

## 2018-08-14 ENCOUNTER — Inpatient Hospital Stay: Payer: BLUE CROSS/BLUE SHIELD | Admitting: Medical

## 2018-08-14 ENCOUNTER — Inpatient Hospital Stay: Payer: BLUE CROSS/BLUE SHIELD

## 2018-08-14 ENCOUNTER — Inpatient Hospital Stay: Payer: BLUE CROSS/BLUE SHIELD | Admitting: Hematology

## 2018-08-14 ENCOUNTER — Other Ambulatory Visit: Payer: Self-pay | Admitting: Medical

## 2018-08-14 VITALS — BP 136/82 | HR 96 | Temp 97.8°F | Resp 18

## 2018-08-14 VITALS — BP 133/84 | HR 99 | Temp 98.6°F | Resp 18 | Ht 68.0 in | Wt 194.9 lb

## 2018-08-14 DIAGNOSIS — C8110 Nodular sclerosis classical Hodgkin lymphoma, unspecified site: Secondary | ICD-10-CM

## 2018-08-14 DIAGNOSIS — Z95828 Presence of other vascular implants and grafts: Secondary | ICD-10-CM

## 2018-08-14 DIAGNOSIS — R1084 Generalized abdominal pain: Secondary | ICD-10-CM

## 2018-08-14 DIAGNOSIS — Z5111 Encounter for antineoplastic chemotherapy: Secondary | ICD-10-CM | POA: Diagnosis not present

## 2018-08-14 LAB — CBC WITH DIFFERENTIAL/PLATELET
ABS IMMATURE GRANULOCYTES: 0.04 10*3/uL (ref 0.00–0.07)
Basophils Absolute: 0.1 10*3/uL (ref 0.0–0.1)
Basophils Relative: 1 %
Eosinophils Absolute: 0.4 10*3/uL (ref 0.0–0.5)
Eosinophils Relative: 4 %
HCT: 39.4 % (ref 39.0–52.0)
HEMOGLOBIN: 13.5 g/dL (ref 13.0–17.0)
Immature Granulocytes: 0 %
Lymphocytes Relative: 8 %
Lymphs Abs: 0.8 10*3/uL (ref 0.7–4.0)
MCH: 27.2 pg (ref 26.0–34.0)
MCHC: 34.3 g/dL (ref 30.0–36.0)
MCV: 79.4 fL — ABNORMAL LOW (ref 80.0–100.0)
Monocytes Absolute: 0.9 10*3/uL (ref 0.1–1.0)
Monocytes Relative: 9 %
NEUTROS ABS: 8.6 10*3/uL — AB (ref 1.7–7.7)
Neutrophils Relative %: 78 %
Platelets: 376 10*3/uL (ref 150–400)
RBC: 4.96 MIL/uL (ref 4.22–5.81)
RDW: 16.4 % — ABNORMAL HIGH (ref 11.5–15.5)
WBC: 10.9 10*3/uL — ABNORMAL HIGH (ref 4.0–10.5)
nRBC: 0 % (ref 0.0–0.2)

## 2018-08-14 LAB — CMP (CANCER CENTER ONLY)
ALK PHOS: 72 U/L (ref 38–126)
ALT: 26 U/L (ref 0–44)
AST: 18 U/L (ref 15–41)
Albumin: 4.1 g/dL (ref 3.5–5.0)
Anion gap: 13 (ref 5–15)
BUN: 23 mg/dL — AB (ref 6–20)
CHLORIDE: 103 mmol/L (ref 98–111)
CO2: 23 mmol/L (ref 22–32)
Calcium: 9.5 mg/dL (ref 8.9–10.3)
Creatinine: 1.1 mg/dL (ref 0.61–1.24)
GFR, Est AFR Am: >60 mL/min (ref >60)
GFR, Estimated: >60 mL/min (ref >60)
Glucose, Bld: 156 mg/dL — ABNORMAL HIGH (ref 70–99)
Potassium: 3.5 mmol/L (ref 3.5–5.1)
Sodium: 139 mmol/L (ref 135–145)
Total Bilirubin: 0.3 mg/dL (ref 0.3–1.2)
Total Protein: 8.1 g/dL (ref 6.5–8.1)

## 2018-08-14 LAB — SEDIMENTATION RATE: Sed Rate: 10 mm/hr (ref 0–16)

## 2018-08-14 MED ORDER — ALUM & MAG HYDROXIDE-SIMETH 200-200-20 MG/5ML PO SUSP
30.0000 mL | Freq: Once | ORAL | Status: AC
  Administered 2018-08-14: 30 mL via ORAL

## 2018-08-14 MED ORDER — PALONOSETRON HCL INJECTION 0.25 MG/5ML
INTRAVENOUS | Status: AC
  Filled 2018-08-14: qty 5

## 2018-08-14 MED ORDER — SODIUM CHLORIDE 0.9 % IV SOLN
Freq: Once | INTRAVENOUS | Status: AC
  Administered 2018-08-14: 12:00:00 via INTRAVENOUS
  Filled 2018-08-14: qty 5

## 2018-08-14 MED ORDER — DOXORUBICIN HCL CHEMO IV INJECTION 2 MG/ML
25.0000 mg/m2 | Freq: Once | INTRAVENOUS | Status: AC
  Administered 2018-08-14: 50 mg via INTRAVENOUS
  Filled 2018-08-14: qty 25

## 2018-08-14 MED ORDER — PALONOSETRON HCL INJECTION 0.25 MG/5ML
0.2500 mg | Freq: Once | INTRAVENOUS | Status: AC
  Administered 2018-08-14: 0.25 mg via INTRAVENOUS

## 2018-08-14 MED ORDER — ATROPINE SULFATE 1 MG/ML IJ SOLN
0.4000 mg | Freq: Once | INTRAMUSCULAR | Status: AC
  Administered 2018-08-14: 0.4 mg via INTRAVENOUS

## 2018-08-14 MED ORDER — SODIUM CHLORIDE 0.9 % IV SOLN
375.0000 mg/m2 | Freq: Once | INTRAVENOUS | Status: AC
  Administered 2018-08-14: 750 mg via INTRAVENOUS
  Filled 2018-08-14: qty 75

## 2018-08-14 MED ORDER — ALUM & MAG HYDROXIDE-SIMETH 200-200-20 MG/5ML PO SUSP
ORAL | Status: AC
  Filled 2018-08-14: qty 30

## 2018-08-14 MED ORDER — ALUM & MAG HYDROXIDE-SIMETH 200-200-20 MG/5ML PO SUSP: 30.0000 mL | Freq: Once | ORAL | Status: DC

## 2018-08-14 MED ORDER — VINBLASTINE SULFATE CHEMO INJECTION 1 MG/ML
6.0500 mg/m2 | Freq: Once | INTRAVENOUS | Status: AC
  Administered 2018-08-14: 12 mg via INTRAVENOUS
  Filled 2018-08-14: qty 12

## 2018-08-14 MED ORDER — SODIUM CHLORIDE 0.9% FLUSH
10.0000 mL | INTRAVENOUS | Status: DC | PRN
  Administered 2018-08-14: 10 mL
  Filled 2018-08-14: qty 10

## 2018-08-14 MED ORDER — SODIUM CHLORIDE 0.9 % IV SOLN
Freq: Once | INTRAVENOUS | Status: AC
  Administered 2018-08-14: 11:00:00 via INTRAVENOUS
  Filled 2018-08-14: qty 250

## 2018-08-14 MED ORDER — SODIUM CHLORIDE 0.9 % IV SOLN
10.0000 [IU]/m2 | Freq: Once | INTRAVENOUS | Status: AC
  Administered 2018-08-14: 20 [IU] via INTRAVENOUS
  Filled 2018-08-14: qty 6.67

## 2018-08-14 MED ORDER — HEPARIN SOD (PORK) LOCK FLUSH 100 UNIT/ML IV SOLN
500.0000 [IU] | Freq: Once | INTRAVENOUS | Status: AC | PRN
  Administered 2018-08-14: 500 [IU]
  Filled 2018-08-14: qty 5

## 2018-08-14 MED ORDER — ATROPINE SULFATE 1 MG/ML IJ SOLN
INTRAMUSCULAR | Status: AC
  Filled 2018-08-14: qty 1

## 2018-08-14 NOTE — Patient Instructions (Signed)
[facility] Discharge Instructions for Patients Receiving Chemotherapy  Today you received the following chemotherapy agents:  Doxorubicin, Venblastine, Bleomycin, Dacarbezine  To help prevent nausea and vomiting after your treatment, we encourage you to take your nausea medication as prescribed.   If you develop nausea and vomiting that is not controlled by your nausea medication, call the clinic.   BELOW ARE SYMPTOMS THAT SHOULD BE REPORTED IMMEDIATELY:  *FEVER GREATER THAN 100.5 F  *CHILLS WITH OR WITHOUT FEVER  NAUSEA AND VOMITING THAT IS NOT CONTROLLED WITH YOUR NAUSEA MEDICATION  *UNUSUAL SHORTNESS OF BREATH  *UNUSUAL BRUISING OR BLEEDING  TENDERNESS IN MOUTH AND THROAT WITH OR WITHOUT PRESENCE OF ULCERS  *URINARY PROBLEMS  *BOWEL PROBLEMS  UNUSUAL RASH Items with * indicate a potential emergency and should be followed up as soon as possible.  Feel free to call the clinic should you have any questions or concerns. The clinic phone number is [phone].  Please show the [place] at check-in to the Emergency Department and triage nurse.

## 2018-08-14 NOTE — Progress Notes (Signed)
1335 - Pt reports severe abdominal pain during bleomycin infusion. Infusion Paused. VSS V. Tanner PA notified. Atropine .4 ordered IVP.   After assessment by Sandi Mealy, Infusion restarted. Pt encouraged to eat and Maalox ordered.

## 2018-08-14 NOTE — Progress Notes (Signed)
This encounter was created in error - please disregard.

## 2018-08-16 ENCOUNTER — Encounter (HOSPITAL_COMMUNITY)
Admission: RE | Admit: 2018-08-16 | Discharge: 2018-08-16 | Disposition: A | Discharge status: Home / Self Care | Payer: BLUE CROSS/BLUE SHIELD | Source: Ambulatory Visit | Attending: Hematology | Admitting: Hematology

## 2018-08-16 DIAGNOSIS — C8110 Nodular sclerosis classical Hodgkin lymphoma, unspecified site: Secondary | ICD-10-CM | POA: Insufficient documentation

## 2018-08-16 LAB — GLUCOSE, CAPILLARY: Glucose-Capillary: 92 mg/dL (ref 70–99)

## 2018-08-16 MED ORDER — FLUDEOXYGLUCOSE F - 18 (FDG) INJECTION
10.9100 | Freq: Once | INTRAVENOUS | Status: AC | PRN
  Administered 2018-08-16: 10.91 via INTRAVENOUS

## 2018-08-21 NOTE — Progress Notes (Signed)
The patient reported having abdominal pain while receiving bleomycin.  He was given atropine 0.4 mg IV x1.  He was also given Maalox and then ate food without any further concerns.  He was told to begin using Prilosec 20 mg once daily.  Sandi Mealy, MHS, PA-C Physician Assistant

## 2018-08-29 NOTE — Progress Notes (Signed)
HEMATOLOGY/ONCOLOGY CLINIC NOTE  Date of Service: 08/30/18     Patient Care Team: System, Pcp Not In as PCP - General  CHIEF COMPLAINTS/PURPOSE OF CONSULTATION:  Nodular sclerosis Hodgkin's Lymphoma   HISTORY OF PRESENTING ILLNESS:   David Lam is a wonderful 23 y.o. male who has been referred to [place] by Dr. 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 He is accompanied today by his mother and sister. The pt reports that he is doing well overall.   The pt presented to the ED on 05/07/18 after developing chest pain over 2 days, with positional elements. The pt was evaluated with a CXR which revealed a mediastinal mass that was biopsied on 05/10/18 and confirmed nodular sclerosis Hodgkin's lymphoma, as noted below.   The pt notes that he though his chest discomfort was due to working out, and denies noticing any swelling in the area. The pt denies any recent fevers, chills, night sweats, skin rashes, unexpected weight loss. The pt notes that his chest pain continues and is making his sleeping difficult. He denies any difficulty ambulating or SOB.   The pt reports that he has not had any prior medical problems, and has had left thumb and left ankle surgeries from injuries. 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   Of note prior to the patient's visit today, pt has had a Scalene lymph node biopsy completed on 05/10/18 with results revealing Nodular sclerosis Hodgkin's Lymphoma    The pt also had a CTA Chest on 05/07/18 which revealed Bulky, bilateral anterior superior mediastinal confluent soft tissue masses which are not calcified in appearance and which do not appear to cause occlusion or significant luminal narrowing of traversing vasculature. Leading consideration is lymphoma, possibly Hodgkin's. Given lack of differentiating soft tissue densities, a teratoma is believed less likely. Nonseminomatous germ cell tumor given presence of  small effusions might also be within differential though no pulmonary lesions are visualized. Metastatic disease, infection or inflammatory process are believed less likely.  The pt also had a CT A/P on 05/08/18 which revealed No acute finding or evidence of malignancy in the abdomen.  Most recent lab results (05/09/18) of CBC is as follows: all values are WNL except for WBC at 10.6k, HGB at 11.9, HCT at 34.7, MCV at 78.5, ANC at 7.9k, Eosinophils abs at 600. 05/08/18 Sed Rate at 19  On review of systems, pt reports chest pain, moving his bowels well, and denies skin rashes, fevers, chills, night sweats, unexpected weight loss, SOB, headaches, changes in vision, urinary pain or discomfort, abdominal pains, testicular pain or swelling, leg swelling, arm swelling, and any other symptoms.   On PMHx the pt reports left thumb and left ankle surgeries, denies blood transfusions. On Social Hx the pt denies alcohol consumption and denies smoking cigarettes or other recreational drugs. 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 Denies tattoos.  On Family Hx the pt denies any blood disorders or cancers.  He denies any medications.  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 The patient's last visit with [place] was on 07/31/18. He is accompanied today by his sister. The pt reports that he is doing well overall.   The pt reports that he is not having any fevers, but endorses a cough and runny nose which began yesterday. The pt denies throat pain, and coming into contact with individuals  with infections. He denies abdominal pains. He continues to endorse some sternal discomfort but denies this progressing and denies overt chest pain or shortness of breath.   The pt notes that he has not gone to a sperm bank yet, though we have discussed this multiple times.  Of note since the patient's last visit, pt  has had a PET/CT completed on 08/16/18 with results revealing Since the prior PET of 05/21/2018, mild disease progression, as evidenced by increased activity and less so size of hypermetabolic thoracic nodes. (Deauville 4) 2. No new or extrathoracic sites of disease. 3. Significant hypermetabolic "brown" fat throughout the neck and chest, mildly degrading evaluation.  Lab results today (08/30/18) of CBC w/diff and CMP is as follows: all values are WNL except for HGB at 12.8, HCT at 38.2, MCV at 79.9, RDW at 16.5, Monocytes abs at 1.3k, Immature granulocytes at 0.08k.  On review of systems, pt reports moving his bowels well, cough, runny nose, eating well, stable weight, sternal discomfort, and denies throat pain, fevers, chills, abdominal pains, and any other symptoms.    MEDICAL HISTORY:  No past medical history on file.  SURGICAL HISTORY: Past Surgical History:  Procedure Laterality Date  . ANKLE SURGERY Left    cleaned up   . FRACTURE SURGERY Left    arm  . IR IMAGING GUIDED PORT INSERTION  06/03/2018  . MEDIASTINOTOMY CHAMBERLAIN MCNEIL N/A 05/10/2018   Procedure: Mediastinal mass biopsy ;  Surgeon: Grace Isaac, MD;  Location: [facility] OR;  Service: Thoracic;  Laterality: N/A;  . ORIF FINGER / THUMB FRACTURE Left     SOCIAL HISTORY: Social History   Socioeconomic History  . Marital status: Unknown    Spouse name: Not on file  . Number of children: Not on file  . Years of education: Not on file  . Highest education level: Not on file  Occupational History  . Not on file  Social Needs  . Financial resource strain: Not on file  . Food insecurity:    Worry: Not on file    Inability: Not on file  . Transportation needs:    Medical: Not on file    Non-medical: Not on file  Tobacco Use  . Smoking status: Never Smoker  . Smokeless tobacco: Never Used  Substance and Sexual Activity  . Alcohol use: Never    Frequency: Never  . Drug use: Never  . Sexual activity: Not on  file  Lifestyle  . Physical activity:    Days per week: Not on file    Minutes per session: Not on file  . Stress: Not on file  Relationships  . Social connections:    Talks on phone: Not on file    Gets together: Not on file    Attends religious service: Not on file    Active member of club or organization: Not on file    Attends meetings of clubs or organizations: Not on file    Relationship status: Not on file  . Intimate partner violence:    Fear of current or ex partner: Not on file    Emotionally abused: Not on file    Physically abused: Not on file    Forced sexual activity: Not on file  Other Topics Concern  . Not on file  Social History Narrative  . Not on file    FAMILY HISTORY: No family history on file.  ALLERGIES:  has No Known Allergies.  MEDICATIONS:  Current Outpatient Medications  Medication Sig  Dispense Refill  . cephALEXin (KEFLEX) 500 MG capsule Take 1 capsule (500 mg total) by mouth 3 (three) times daily. 21 capsule 0  . diphenhydrAMINE (BENADRYL) 25 mg capsule Take 25 mg by mouth as needed.    . lidocaine-prilocaine (EMLA) cream Apply 1 application topically as needed. 30 g 0  . prochlorperazine (COMPAZINE) 10 MG tablet Take 1 tablet (10 mg total) by mouth every 6 (six) hours as needed for nausea or vomiting. 30 tablet 1   No current facility-administered medications for this visit.     REVIEW OF SYSTEMS:    A 10+ POINT REVIEW OF SYSTEMS WAS OBTAINED including neurology, dermatology, psychiatry, cardiac, respiratory, lymph, extremities, GI, GU, Musculoskeletal, constitutional, breasts, reproductive, HEENT.  All pertinent positives are noted in the HPI.  All others are negative.   PHYSICAL EXAMINATION: ECOG PERFORMANCE STATUS: 1 - Symptomatic but completely ambulatory  . Vitals:   08/30/18 1217  BP: 133/80  Pulse: 98  Resp: 18  Temp: 98.2 F (36.8 C)  SpO2: 100%   Filed Weights   08/30/18 1217  Weight: 198 lb 6.4 oz (90 kg)   .Body  mass index is 30.17 kg/m.  GENERAL:alert, in no acute distress and comfortable SKIN: no acute rashes, no significant lesions EYES: conjunctiva are pink and non-injected, sclera anicteric OROPHARYNX: MMM, no exudates, no oropharyngeal erythema or ulceration NECK: supple, no JVD LYMPH:  no palpable lymphadenopathy in the cervical, axillary or inguinal regions LUNGS: clear to auscultation b/l with normal respiratory effort HEART: regular rate & rhythm ABDOMEN:  normoactive bowel sounds , non tender, not distended. No palpable hepatosplenomegaly.  Extremity: no pedal edema PSYCH: alert & oriented x 3 with fluent speech NEURO: no focal motor/sensory deficits   LABORATORY DATA:  I have reviewed the data as listed  . CBC Latest Ref Rng & Units 08/30/2018 08/14/2018 07/31/2018  WBC 4.0 - 10.5 K/uL 10.3 10.9(H) 12.0(H)  Hemoglobin 13.0 - 17.0 g/dL 12.8(L) 13.5 13.1  Hematocrit 39.0 - 52.0 % 38.2(L) 39.4 38.3(L)  Platelets 150 - 400 K/uL 322 376 395    . CMP Latest Ref Rng & Units 08/30/2018 08/14/2018 07/31/2018  Glucose 70 - 99 mg/dL 93 156(H) 123(H)  BUN 6 - 20 mg/dL 18 23(H) 16  Creatinine 0.61 - 1.24 mg/dL 1.05 1.10 1.02  Sodium 135 - 145 mmol/L 137 139 137  Potassium 3.5 - 5.1 mmol/L 4.1 3.5 3.8  Chloride 98 - 111 mmol/L 105 103 101  CO2 22 - 32 mmol/L 25 23 26   Calcium 8.9 - 10.3 mg/dL 9.3 9.5 9.4  Total Protein 6.5 - 8.1 g/dL 7.8 8.1 7.9  Total Bilirubin 0.3 - 1.2 mg/dL 0.6 0.3 0.6  Alkaline Phos 38 - 126 U/L 88 72 84  AST 15 - 41 U/L 15 18 16   ALT 0 - 44 U/L 21 26 31    05/10/18 Biopsy:   05/22/18 BM Bx:    RADIOGRAPHIC STUDIES: I have personally reviewed the radiological images as listed and agreed with the findings in the report. Nm Pet Image Restag (ps) Skull Base To Thigh  Result Date: 08/16/2018 CLINICAL DATA:  Subsequent treatment strategy for restaging of lymphoma. EXAM: NUCLEAR MEDICINE PET SKULL BASE TO THIGH TECHNIQUE: 10.9 mCi F-18 FDG was injected intravenously.  Full-ring PET imaging was performed from the skull base to thigh after the radiotracer. CT data was obtained and used for attenuation correction and anatomic localization. Fasting blood glucose: 92 mg/dl COMPARISON:  PET of 05/21/2018. FINDINGS: Mediastinal blood pool activity: SUV  max 2.1 NECK: Significant, marked hypermetabolic brown fat throughout the neck and chest. Given this limitation, no hypermetabolic nodes within the neck. Incidental CT findings: No gross cervical adenopathy. Paucity of fat degrades evaluation. CHEST: Redemonstration of extensive hypermetabolic thoracic adenopathy. Index right paratracheal node measures 1.6 cm and a S.U.V. max of 10.3 today versus similar in size and a S.U.V. max of 7.7 on the prior. A prevascular nodal mass measures on the order of 3.7 by 4.7 cm and a S.U.V. max of 6.3 today. Compare 3.6 x 4.2 cm and a S.U.V. max of 4.4 on the prior. Right cardiophrenic angle mass measures on the order of 7.3 x 4.2 cm and a S.U.V. max of 14.1 today. Compare 6.3 x 4.1 cm and a S.U.V. max of 6.8 on the prior. Incidental CT findings: Right Port-A-Cath with tip at mid right atrium. ABDOMEN/PELVIS: No abdominopelvic parenchymal or nodal hypermetabolism. Incidental CT findings: Normal adrenal glands. No splenomegaly. Trace cul-de-sac fluid on image 177/4. SKELETON: No abnormal marrow activity. Incidental CT findings: none IMPRESSION: 1. Since the prior PET of 05/21/2018, mild disease progression, as evidenced by increased activity and less so size of hypermetabolic thoracic nodes. (Deauville 4) 2. No new or extrathoracic sites of disease. 3. Significant hypermetabolic "brown" fat throughout the neck and chest, mildly degrading evaluation. Electronically Signed   By: Abigail Miyamoto M.D.   On: 08/16/2018 16:16    ASSESSMENT & PLAN:   23 y.o. male with  1. 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 Leading consideration is lymphoma, possibly Hodgkin's. Given lack of differentiating soft tissue densities, a teratoma is believed less likely. Nonseminomatous germ cell tumor given presence of small effusions might also be within differential though no pulmonary lesions are visualized. Metastatic disease, infection or inflammatory process are believed less likely.    05/08/18 CT A/P revealed No acute finding or evidence of malignancy in the abdomen.  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 No hypermetabolic lymph nodes within the neck, abdomen or pelvis.  05/22/18 BM Bx was negative   2. B/L great toe subungal hematomas - likely from chemotherapy -podiatry referral given -Patient saw Podiatry, and toe nail was removed -Continue Epsom salt soaks   PLAN:  -Discussed pt labwork today, 08/30/18; blood counts and chemistries are stable -Discussed the 08/16/18 PET/CT which revealed Since the prior PET of 05/21/2018, mild disease progression, as evidenced by increased activity and less so size of hypermetabolic thoracic nodes. (Deauville 4) 2. No new or extrathoracic sites of disease. 3. Significant hypermetabolic "brown" fat throughout the neck and chest, mildly degrading evaluation. -Completed 3 cycles of ABVD -Discussed with radiology - confirmed concern for residual Deauville 4 disease -Will hold treatment planned for today. Will switch regimens and tentatively plan to start next week of switching Bleomycin for Brentuximab vs  switching to Escalated BEACOPP -Will send respiratory viral panel today -Continue with stretching exercises -Continue with infection prevention strategies -Continue applying Neosporin to toe nail beds  -Continue Vitamin B complex -Recommend salt and baking  soda mouthwashes to prevent mouth sores  -Recommended that the pt continue to eat well, drink at least 48-64 oz of water each day, and walk 20-30 minutes each day.  -Will see the pt back in one week   -Holding chemotherapy today -- please reschedule in 6-7 days with labs and MD visit -viral swab today   All of the patients questions were answered with apparent satisfaction. The patient knows to call the clinic with any problems, questions or concerns.  The total time spent in the appt was 30 minutes and more than 50% was on counseling and direct patient cares.    Sullivan Lone MD MS AAHIVMS [facility] Hematology/Oncology Physician [facility]  (Office):       [phone] (Work cell):  [phone] (Fax):           [phone]  08/30/2018 12:53 PM   I, Baldwin [place], am acting as a scribe for Dr. Sullivan Lone.   .I have reviewed the above documentation for accuracy and completeness, and I agree with the above. [name] MD

## 2018-08-30 ENCOUNTER — Inpatient Hospital Stay: Payer: BLUE CROSS/BLUE SHIELD | Admitting: Hematology

## 2018-08-30 ENCOUNTER — Telehealth: Payer: Self-pay | Admitting: Hematology

## 2018-08-30 ENCOUNTER — Ambulatory Visit: Payer: BLUE CROSS/BLUE SHIELD | Admitting: Hematology

## 2018-08-30 ENCOUNTER — Ambulatory Visit: Payer: BLUE CROSS/BLUE SHIELD

## 2018-08-30 ENCOUNTER — Inpatient Hospital Stay: Payer: BLUE CROSS/BLUE SHIELD

## 2018-08-30 ENCOUNTER — Other Ambulatory Visit: Payer: BLUE CROSS/BLUE SHIELD

## 2018-08-30 ENCOUNTER — Inpatient Hospital Stay: Payer: BLUE CROSS/BLUE SHIELD | Attending: Hematology

## 2018-08-30 VITALS — BP 133/80 | HR 98 | Temp 98.2°F | Resp 18 | Ht 68.0 in | Wt 198.4 lb

## 2018-08-30 DIAGNOSIS — C8110 Nodular sclerosis classical Hodgkin lymphoma, unspecified site: Secondary | ICD-10-CM

## 2018-08-30 DIAGNOSIS — Z5111 Encounter for antineoplastic chemotherapy: Secondary | ICD-10-CM | POA: Diagnosis not present

## 2018-08-30 DIAGNOSIS — I2693 Single subsegmental pulmonary embolism without acute cor pulmonale: Secondary | ICD-10-CM | POA: Insufficient documentation

## 2018-08-30 DIAGNOSIS — R0789 Other chest pain: Secondary | ICD-10-CM | POA: Insufficient documentation

## 2018-08-30 DIAGNOSIS — Z95828 Presence of other vascular implants and grafts: Secondary | ICD-10-CM

## 2018-08-30 DIAGNOSIS — J40 Bronchitis, not specified as acute or chronic: Secondary | ICD-10-CM

## 2018-08-30 DIAGNOSIS — R05 Cough: Secondary | ICD-10-CM

## 2018-08-30 DIAGNOSIS — Z5189 Encounter for other specified aftercare: Secondary | ICD-10-CM | POA: Diagnosis not present

## 2018-08-30 DIAGNOSIS — R0989 Other specified symptoms and signs involving the circulatory and respiratory systems: Secondary | ICD-10-CM

## 2018-08-30 DIAGNOSIS — R079 Chest pain, unspecified: Secondary | ICD-10-CM

## 2018-08-30 DIAGNOSIS — C8118 Nodular sclerosis classical Hodgkin lymphoma, lymph nodes of multiple sites: Secondary | ICD-10-CM | POA: Insufficient documentation

## 2018-08-30 DIAGNOSIS — I2699 Other pulmonary embolism without acute cor pulmonale: Secondary | ICD-10-CM | POA: Insufficient documentation

## 2018-08-30 DIAGNOSIS — C8190 Hodgkin lymphoma, unspecified, unspecified site: Secondary | ICD-10-CM | POA: Insufficient documentation

## 2018-08-30 LAB — CBC WITH DIFFERENTIAL/PLATELET
Abs Immature Granulocytes: 0.08 10*3/uL — ABNORMAL HIGH (ref 0.00–0.07)
Basophils Absolute: 0.1 10*3/uL (ref 0.0–0.1)
Basophils Relative: 1 %
Eosinophils Absolute: 0.5 10*3/uL (ref 0.0–0.5)
Eosinophils Relative: 5 %
HCT: 38.2 % — ABNORMAL LOW (ref 39.0–52.0)
Hemoglobin: 12.8 g/dL — ABNORMAL LOW (ref 13.0–17.0)
Immature Granulocytes: 1 %
Lymphocytes Relative: 7 %
Lymphs Abs: 0.8 10*3/uL (ref 0.7–4.0)
MCH: 26.8 pg (ref 26.0–34.0)
MCHC: 33.5 g/dL (ref 30.0–36.0)
MCV: 79.9 fL — ABNORMAL LOW (ref 80.0–100.0)
Monocytes Absolute: 1.3 10*3/uL — ABNORMAL HIGH (ref 0.1–1.0)
Monocytes Relative: 13 %
NRBC: 0 % (ref 0.0–0.2)
Neutro Abs: 7.5 10*3/uL (ref 1.7–7.7)
Neutrophils Relative %: 73 %
PLATELETS: 322 10*3/uL (ref 150–400)
RBC: 4.78 MIL/uL (ref 4.22–5.81)
RDW: 16.5 % — ABNORMAL HIGH (ref 11.5–15.5)
WBC: 10.3 10*3/uL (ref 4.0–10.5)

## 2018-08-30 LAB — CMP (CANCER CENTER ONLY)
ALBUMIN: 4.1 g/dL (ref 3.5–5.0)
ALT: 21 U/L (ref 0–44)
AST: 15 U/L (ref 15–41)
Alkaline Phosphatase: 88 U/L (ref 38–126)
Anion gap: 7 (ref 5–15)
BUN: 18 mg/dL (ref 6–20)
CO2: 25 mmol/L (ref 22–32)
Calcium: 9.3 mg/dL (ref 8.9–10.3)
Chloride: 105 mmol/L (ref 98–111)
Creatinine: 1.05 mg/dL (ref 0.61–1.24)
GFR, Est AFR Am: >60 mL/min (ref >60)
GFR, Estimated: >60 mL/min (ref >60)
Glucose, Bld: 93 mg/dL (ref 70–99)
Potassium: 4.1 mmol/L (ref 3.5–5.1)
Sodium: 137 mmol/L (ref 135–145)
Total Bilirubin: 0.6 mg/dL (ref 0.3–1.2)
Total Protein: 7.8 g/dL (ref 6.5–8.1)

## 2018-08-30 MED ORDER — ONDANSETRON HCL 8 MG PO TABS: 8.0000 mg | ORAL_TABLET | Freq: Two times a day (BID) | ORAL | 1 refills | Status: DC | PRN

## 2018-08-30 MED ORDER — PROCARBAZINE HCL 50 MG PO CAPS: 100.0000 mg/m2/d | ORAL_CAPSULE | Freq: Every day | ORAL | 2 refills | Status: DC

## 2018-08-30 MED ORDER — PREDNISONE 20 MG PO TABS: 60.0000 mg | ORAL_TABLET | Freq: Every day | ORAL | 1 refills | Status: DC

## 2018-08-30 MED ORDER — LORAZEPAM 0.5 MG PO TABS: 0.5000 mg | ORAL_TABLET | Freq: Four times a day (QID) | ORAL | 0 refills | Status: AC | PRN

## 2018-08-30 MED ORDER — SODIUM CHLORIDE 0.9% FLUSH
10.0000 mL | INTRAVENOUS | Status: DC | PRN
  Administered 2018-08-30: 10 mL
  Filled 2018-08-30: qty 10

## 2018-08-30 NOTE — Telephone Encounter (Signed)
Called patient and patient is aware of appt date and time.  Patient stated he will get a calendar at his next doc visit on the 02/11.  Scheduled appt per 2/7 los.

## 2018-08-30 NOTE — Progress Notes (Signed)
START ON PATHWAY REGIMEN - Lymphoma and CLL     A cycle is every 21 days:     Cyclophosphamide      Doxorubicin      Etoposide      Procarbazine      Prednisone      Bleomycin      Vincristine      [id number]-[id number]   **Always confirm dose/schedule in your pharmacy ordering system**  Patient Characteristics: Classical Hodgkin Lymphoma, First Line, Stage I / II, Early Unfavorable with Bulky Mediastinal Disease (10 cm or > 1/3 Diameter of Chest), Age < 39 Disease Type: Not Applicable Disease Type: Not Applicable Disease Type: Classical Hodgkin Lymphoma Line of therapy: First Line [place] Stage: IIA First Line, Stage I/II Disease Characteristics: Early Unfavorable with Bulky Mediastinal Disease (10 cm or > 1/3 Diameter of Chest) Age: < 60 Intent of Therapy: Curative Intent, Discussed with Patient

## 2018-09-03 ENCOUNTER — Encounter (HOSPITAL_COMMUNITY): Payer: Self-pay

## 2018-09-03 ENCOUNTER — Telehealth: Payer: Self-pay | Admitting: Pharmacist

## 2018-09-03 ENCOUNTER — Inpatient Hospital Stay (HOSPITAL_BASED_OUTPATIENT_CLINIC_OR_DEPARTMENT_OTHER): Payer: BLUE CROSS/BLUE SHIELD | Admitting: Hematology

## 2018-09-03 ENCOUNTER — Telehealth: Payer: Self-pay

## 2018-09-03 ENCOUNTER — Inpatient Hospital Stay: Payer: BLUE CROSS/BLUE SHIELD

## 2018-09-03 ENCOUNTER — Other Ambulatory Visit: Payer: Self-pay

## 2018-09-03 VITALS — BP 122/80 | HR 105 | Temp 98.2°F | Resp 18 | Ht 68.0 in | Wt 196.9 lb

## 2018-09-03 DIAGNOSIS — Z8572 Personal history of non-Hodgkin lymphomas: Secondary | ICD-10-CM

## 2018-09-03 DIAGNOSIS — C8110 Nodular sclerosis classical Hodgkin lymphoma, unspecified site: Secondary | ICD-10-CM

## 2018-09-03 DIAGNOSIS — Z5111 Encounter for antineoplastic chemotherapy: Secondary | ICD-10-CM

## 2018-09-03 DIAGNOSIS — I2693 Single subsegmental pulmonary embolism without acute cor pulmonale: Secondary | ICD-10-CM | POA: Insufficient documentation

## 2018-09-03 DIAGNOSIS — C8118 Nodular sclerosis classical Hodgkin lymphoma, lymph nodes of multiple sites: Secondary | ICD-10-CM | POA: Diagnosis not present

## 2018-09-03 DIAGNOSIS — R0789 Other chest pain: Secondary | ICD-10-CM

## 2018-09-03 DIAGNOSIS — Z95828 Presence of other vascular implants and grafts: Secondary | ICD-10-CM

## 2018-09-03 LAB — CMP (CANCER CENTER ONLY)
ALBUMIN: 4.1 g/dL (ref 3.5–5.0)
ALT: 20 U/L (ref 0–44)
AST: 17 U/L (ref 15–41)
Alkaline Phosphatase: 90 U/L (ref 38–126)
Anion gap: 10 (ref 5–15)
BUN: 13 mg/dL (ref 6–20)
CO2: 25 mmol/L (ref 22–32)
Calcium: 9.5 mg/dL (ref 8.9–10.3)
Chloride: 106 mmol/L (ref 98–111)
Creatinine: 1.02 mg/dL (ref 0.61–1.24)
GFR, Est AFR Am: >60 mL/min (ref >60)
GFR, Estimated: >60 mL/min (ref >60)
GLUCOSE: 106 mg/dL — AB (ref 70–99)
Potassium: 3.8 mmol/L (ref 3.5–5.1)
Sodium: 141 mmol/L (ref 135–145)
Total Bilirubin: 0.5 mg/dL (ref 0.3–1.2)
Total Protein: 7.9 g/dL (ref 6.5–8.1)

## 2018-09-03 LAB — RESPIRATORY VIRUS PANEL
Adenovirus: NEGATIVE
Influenza A: NEGATIVE
Influenza B: NEGATIVE
METAPNEUMOVIRUS: NEGATIVE
Parainfluenza 1: NEGATIVE
Parainfluenza 2: NEGATIVE
Parainfluenza 3: NEGATIVE
RHINOVIRUS: POSITIVE — AB
Respiratory Syncytial Virus A: NEGATIVE
Respiratory Syncytial Virus B: NEGATIVE

## 2018-09-03 LAB — CBC WITH DIFFERENTIAL/PLATELET
Abs Immature Granulocytes: 0.08 10*3/uL — ABNORMAL HIGH (ref 0.00–0.07)
Basophils Absolute: 0.1 10*3/uL (ref 0.0–0.1)
Basophils Relative: 0 %
Eosinophils Absolute: 0.6 10*3/uL — ABNORMAL HIGH (ref 0.0–0.5)
Eosinophils Relative: 4 %
HCT: 40.2 % (ref 39.0–52.0)
Hemoglobin: 13.6 g/dL (ref 13.0–17.0)
Immature Granulocytes: 1 %
LYMPHS ABS: 1.2 10*3/uL (ref 0.7–4.0)
Lymphocytes Relative: 9 %
MCH: 26.7 pg (ref 26.0–34.0)
MCHC: 33.8 g/dL (ref 30.0–36.0)
MCV: 79 fL — ABNORMAL LOW (ref 80.0–100.0)
Monocytes Absolute: 1.3 10*3/uL — ABNORMAL HIGH (ref 0.1–1.0)
Monocytes Relative: 10 %
NRBC: 0 % (ref 0.0–0.2)
Neutro Abs: 10.3 10*3/uL — ABNORMAL HIGH (ref 1.7–7.7)
Neutrophils Relative %: 76 %
Platelets: 334 10*3/uL (ref 150–400)
RBC: 5.09 MIL/uL (ref 4.22–5.81)
RDW: 16.2 % — ABNORMAL HIGH (ref 11.5–15.5)
WBC: 13.5 10*3/uL — ABNORMAL HIGH (ref 4.0–10.5)

## 2018-09-03 MED ORDER — HEPARIN SOD (PORK) LOCK FLUSH 100 UNIT/ML IV SOLN
500.0000 [IU] | Freq: Once | INTRAVENOUS | Status: AC | PRN
  Administered 2018-09-03: 500 [IU]
  Filled 2018-09-03: qty 5

## 2018-09-03 MED ORDER — SODIUM CHLORIDE 0.9% FLUSH
10.0000 mL | INTRAVENOUS | Status: DC | PRN
  Administered 2018-09-03: 10 mL
  Filled 2018-09-03: qty 10

## 2018-09-03 MED ORDER — PROCARBAZINE HCL 50 MG PO CAPS: 100.0000 mg/m2/d | ORAL_CAPSULE | Freq: Every day | ORAL | 2 refills | Status: DC

## 2018-09-03 NOTE — Telephone Encounter (Signed)
Oral Oncology Patient Advocate Encounter  Prior Authorization for [place] has been approved.    PA# [id number] Effective dates: 09/03/18 through 09/02/19  Oral Oncology Clinic will continue to follow.   [place] Patient [place] Phone [phone] Fax [phone]

## 2018-09-03 NOTE — Telephone Encounter (Signed)
Oral Oncology Pharmacist Encounter  Prescription for procarbazine (Matulane) 50 mg capsules, take 4 capsules (200 mg) by mouth once daily on days 1-7 of every 21-day cycle has been phoned in to [place], as this is the required dispensing pharmacy for procarbazine (Matulane).  Prescription has been marked as urgent as patient is to start his new treatment regimen tomorrow, 09/04/2018.  Patient can speed the process along by contacting the pharmacy at [phone].  I called and left voicemail for patient with above information and instructions to call dispensing pharmacy at about 4:30-5 PM today. I will plan to see the patient in infusion room tomorrow to provide face-to-face initial counseling on new treatment regimen.  Johny Drilling, PharmD, BCPS, BCOP  09/03/2018 3:20 PM Oral Oncology Clinic [phone]

## 2018-09-03 NOTE — Telephone Encounter (Signed)
Oral Oncology Pharmacist Encounter  Received new prescription for procarbazine for the treatment of nodular sclerosis Hodgkin's lymphoma Stage I-II, Deauville 4, in conjunction with bleomycin, etoposide, doxorubicin, cyclophosphamide, vincristine, and prednisone (escalated BEACOPP), planned duration 8 planned .  CTA chest performed 05/07/18 shows bulky, bilateral anterior superior mediastinal confluent soft tissue masses  He was originally treated with ABVD 06/05/18-08/14/18, completed 3 of 6 planned cycles This was discontinued as of 08/30/2018 with disease progression  Labs from 08/30/2018 assessed, OK for treatment.  Procarbazine is planned to be administered at 100 mg/m2 (200mg  daily) on days 1-7 of each 21 day cycle.  Current medication list in Epic reviewed, moderate DDIs with procarbazine identified:  Lorazepam and procarbazine: Category C interaction: both agents with the ability to depress CNS function, patient should be monitored for additive CNS-depressant effects.  Prescription will be e-scribed to [facility] once insurance authorization is obtained as procarbazine is a limited distribution medication and only available for dispensing from this pharmacy. Matulane prescription and enrollment form will be faxed to [facility] at [phone] when prescription is sent electronically.  Oral Oncology Clinic will continue to follow for insurance authorization, copayment issues, initial counseling and start date.  Johny Drilling, PharmD, BCPS, BCOP  09/03/2018 7:51 AM Oral Oncology Clinic [phone]

## 2018-09-03 NOTE — ED Triage Notes (Signed)
Pt states around 2120 he started getting chest pain, pt has non Hodgkins lymphoma starting new treatment tomorrow.  States it hurts when he takes a deep breath.

## 2018-09-03 NOTE — Telephone Encounter (Signed)
Oral Oncology Patient Advocate Encounter  Received notification from [facility] that prior authorization for Matulane is required.  PA submitted on CoverMyMeds Key [id number] Status is pending  Oral Oncology Clinic will continue to follow.  [place] Patient David Lam Phone [phone] Fax [phone]

## 2018-09-03 NOTE — Progress Notes (Signed)
HEMATOLOGY/ONCOLOGY CLINIC NOTE  Date of Service: 09/03/18     Patient Care Team: System, Pcp Not In as PCP - General  CHIEF COMPLAINTS/PURPOSE OF CONSULTATION:  Nodular sclerosis Hodgkin's Lymphoma   HISTORY OF PRESENTING ILLNESS:   David Lam is a wonderful 23 y.o. male who has been referred to [place] by Dr. 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 He is accompanied today by his mother and sister. The pt reports that he is doing well overall.   The pt presented to the ED on 05/07/18 after developing chest pain over 2 days, with positional elements. The pt was evaluated with a CXR which revealed a mediastinal mass that was biopsied on 05/10/18 and confirmed nodular sclerosis Hodgkin's lymphoma, as noted below.   The pt notes that he though his chest discomfort was due to working out, and denies noticing any swelling in the area. The pt denies any recent fevers, chills, night sweats, skin rashes, unexpected weight loss. The pt notes that his chest pain continues and is making his sleeping difficult. He denies any difficulty ambulating or SOB.   The pt reports that he has not had any prior medical problems, and has had left thumb and left ankle surgeries from injuries. 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   Of note prior to the patient's visit today, pt has had a Scalene lymph node biopsy completed on 05/10/18 with results revealing Nodular sclerosis Hodgkin's Lymphoma    The pt also had a CTA Chest on 05/07/18 which revealed Bulky, bilateral anterior superior mediastinal confluent soft tissue masses which are not calcified in appearance and which do not appear to cause occlusion or significant luminal narrowing of traversing vasculature. Leading consideration is lymphoma, possibly Hodgkin's. Given lack of differentiating soft tissue densities, a teratoma is believed less likely. Nonseminomatous germ cell tumor given presence of  small effusions might also be within differential though no pulmonary lesions are visualized. Metastatic disease, infection or inflammatory process are believed less likely.  The pt also had a CT A/P on 05/08/18 which revealed No acute finding or evidence of malignancy in the abdomen.  Most recent lab results (05/09/18) of CBC is as follows: all values are WNL except for WBC at 10.6k, HGB at 11.9, HCT at 34.7, MCV at 78.5, ANC at 7.9k, Eosinophils abs at 600. 05/08/18 Sed Rate at 19  On review of systems, pt reports chest pain, moving his bowels well, and denies skin rashes, fevers, chills, night sweats, unexpected weight loss, SOB, headaches, changes in vision, urinary pain or discomfort, abdominal pains, testicular pain or swelling, leg swelling, arm swelling, and any other symptoms.   On PMHx the pt reports left thumb and left ankle surgeries, denies blood transfusions. On Social Hx the pt denies alcohol consumption and denies smoking cigarettes or other recreational drugs. 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 Denies tattoos.  On Family Hx the pt denies any blood disorders or cancers.  He denies any medications.  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 The patient's last visit with [place] was on 08/30/18. His sister is present via cell phone during our appointment. The pt reports that he is doing well overall.  The pt reports that he feels better today than he did last week when he had concerns for a cold. He notes that he continues to have some central chest  discomfort. He denies any fevers or chills and notes that his energy levels have been stable. The pt denies any diarrhea, abdominal pain, or leg swelling.   Lab results today (09/03/18) of CBC w/diff is as follows: all values are WNL except for WBC at 13.5k, MCV at 79.0, RDW at 16.2, ANC at 10.3k, Monocytes abs at 1.3k,  Eosinophils abs at 600, Abs immature granulocytes at 0.08k. 09/03/18 CMP reviewed On review of systems, pt reports resolving cold, central chest discomfort, and denies fevers, chills, night sweats, mouth sores, pain over the port, pain along the spine, abdominal pain, diarrhea, leg swelling, and any other symptoms.  MEDICAL HISTORY:  No past medical history on file.  SURGICAL HISTORY: Past Surgical History:  Procedure Laterality Date  . ANKLE SURGERY Left    cleaned up   . FRACTURE SURGERY Left    arm  . IR IMAGING GUIDED PORT INSERTION  06/03/2018  . MEDIASTINOTOMY CHAMBERLAIN MCNEIL N/A 05/10/2018   Procedure: Mediastinal mass biopsy ;  Surgeon: Grace Isaac, MD;  Location: [facility] OR;  Service: Thoracic;  Laterality: N/A;  . ORIF FINGER / THUMB FRACTURE Left     SOCIAL HISTORY: Social History   Socioeconomic History  . Marital status: Unknown    Spouse name: Not on file  . Number of children: Not on file  . Years of education: Not on file  . Highest education level: Not on file  Occupational History  . Not on file  Social Needs  . Financial resource strain: Not on file  . Food insecurity:    Worry: Not on file    Inability: Not on file  . Transportation needs:    Medical: Not on file    Non-medical: Not on file  Tobacco Use  . Smoking status: Never Smoker  . Smokeless tobacco: Never Used  Substance and Sexual Activity  . Alcohol use: Never    Frequency: Never  . Drug use: Never  . Sexual activity: Not on file  Lifestyle  . Physical activity:    Days per week: Not on file    Minutes per session: Not on file  . Stress: Not on file  Relationships  . Social connections:    Talks on phone: Not on file    Gets together: Not on file    Attends religious service: Not on file    Active member of club or organization: Not on file    Attends meetings of clubs or organizations: Not on file    Relationship status: Not on file  . Intimate partner violence:    Fear  of current or ex partner: Not on file    Emotionally abused: Not on file    Physically abused: Not on file    Forced sexual activity: Not on file  Other Topics Concern  . Not on file  Social History Narrative  . Not on file    FAMILY HISTORY: No family history on file.  ALLERGIES:  has No Known Allergies.  MEDICATIONS:  Current Outpatient Medications  Medication Sig Dispense Refill  . cephALEXin (KEFLEX) 500 MG capsule Take 1 capsule (500 mg total) by mouth 3 (three) times daily. 21 capsule 0  . diphenhydrAMINE (BENADRYL) 25 mg capsule Take 25 mg by mouth as needed.    . lidocaine-prilocaine (EMLA) cream Apply 1 application topically as needed. 30 g 0  . LORazepam (ATIVAN) 0.5 MG tablet Take 1 tablet (0.5 mg total) by mouth every 6 (six) hours as needed (Nausea or  vomiting). 30 tablet 0  . ondansetron (ZOFRAN) 8 MG tablet Take 1 tablet (8 mg total) by mouth 2 (two) times daily as needed. Start on the third day after doxorubicin/cytoxan chemotherapy. 30 tablet 1  . predniSONE (DELTASONE) 20 MG tablet Take 3 tablets (60 mg total) by mouth daily with breakfast. Take daily on days 1-14 of chemotherapy. 50 tablet 1  . procarbazine (MATULANE) 50 MG capsule Take 4 capsules (200 mg total) by mouth daily. Take daily on days 1-7 of chemotherapy. Follow low tyramine diet. 28 capsule 2  . prochlorperazine (COMPAZINE) 10 MG tablet Take 1 tablet (10 mg total) by mouth every 6 (six) hours as needed for nausea or vomiting. 30 tablet 1   No current facility-administered medications for this visit.     REVIEW OF SYSTEMS:    A 10+ POINT REVIEW OF SYSTEMS WAS OBTAINED including neurology, dermatology, psychiatry, cardiac, respiratory, lymph, extremities, GI, GU, Musculoskeletal, constitutional, breasts, reproductive, HEENT.  All pertinent positives are noted in the HPI.  All others are negative.   PHYSICAL EXAMINATION: ECOG PERFORMANCE STATUS: 1 - Symptomatic but completely ambulatory  . Vitals:    09/03/18 0921  BP: 122/80  Pulse: (!) 105  Resp: 18  Temp: 98.2 F (36.8 C)  SpO2: 99%   Filed Weights   09/03/18 0921  Weight: 196 lb 14.4 oz (89.3 kg)   .Body mass index is 29.94 kg/m.  GENERAL:alert, in no acute distress and comfortable SKIN: no acute rashes, no significant lesions EYES: conjunctiva are pink and non-injected, sclera anicteric OROPHARYNX: MMM, no exudates, no oropharyngeal erythema or ulceration NECK: supple, no JVD LYMPH:  no palpable lymphadenopathy in the cervical, axillary or inguinal regions LUNGS: clear to auscultation b/l with normal respiratory effort HEART: regular rate & rhythm ABDOMEN:  normoactive bowel sounds , non tender, not distended. No palpable hepatosplenomegaly.  Extremity: no pedal edema PSYCH: alert & oriented x 3 with fluent speech NEURO: no focal motor/sensory deficits   LABORATORY DATA:  I have reviewed the data as listed  . CBC Latest Ref Rng & Units 09/03/2018 08/30/2018 08/14/2018  WBC 4.0 - 10.5 K/uL 13.5(H) 10.3 10.9(H)  Hemoglobin 13.0 - 17.0 g/dL 13.6 12.8(L) 13.5  Hematocrit 39.0 - 52.0 % 40.2 38.2(L) 39.4  Platelets 150 - 400 K/uL 334 322 376    . CMP Latest Ref Rng & Units 08/30/2018 08/14/2018 07/31/2018  Glucose 70 - 99 mg/dL 93 156(H) 123(H)  BUN 6 - 20 mg/dL 18 23(H) 16  Creatinine 0.61 - 1.24 mg/dL 1.05 1.10 1.02  Sodium 135 - 145 mmol/L 137 139 137  Potassium 3.5 - 5.1 mmol/L 4.1 3.5 3.8  Chloride 98 - 111 mmol/L 105 103 101  CO2 22 - 32 mmol/L 25 23 26   Calcium 8.9 - 10.3 mg/dL 9.3 9.5 9.4  Total Protein 6.5 - 8.1 g/dL 7.8 8.1 7.9  Total Bilirubin 0.3 - 1.2 mg/dL 0.6 0.3 0.6  Alkaline Phos 38 - 126 U/L 88 72 84  AST 15 - 41 U/L 15 18 16   ALT 0 - 44 U/L 21 26 31    05/10/18 Biopsy:   05/22/18 BM Bx:    RADIOGRAPHIC STUDIES: I have personally reviewed the radiological images as listed and agreed with the findings in the report. Nm Pet Image Restag (ps) Skull Base To Thigh  Result Date:  08/16/2018 CLINICAL DATA:  Subsequent treatment strategy for restaging of lymphoma. EXAM: NUCLEAR MEDICINE PET SKULL BASE TO THIGH TECHNIQUE: 10.9 mCi F-18 FDG was injected intravenously. Full-ring  PET imaging was performed from the skull base to thigh after the radiotracer. CT data was obtained and used for attenuation correction and anatomic localization. Fasting blood glucose: 92 mg/dl COMPARISON:  PET of 05/21/2018. FINDINGS: Mediastinal blood pool activity: SUV max 2.1 NECK: Significant, marked hypermetabolic brown fat throughout the neck and chest. Given this limitation, no hypermetabolic nodes within the neck. Incidental CT findings: No gross cervical adenopathy. Paucity of fat degrades evaluation. CHEST: Redemonstration of extensive hypermetabolic thoracic adenopathy. Index right paratracheal node measures 1.6 cm and a S.U.V. max of 10.3 today versus similar in size and a S.U.V. max of 7.7 on the prior. A prevascular nodal mass measures on the order of 3.7 by 4.7 cm and a S.U.V. max of 6.3 today. Compare 3.6 x 4.2 cm and a S.U.V. max of 4.4 on the prior. Right cardiophrenic angle mass measures on the order of 7.3 x 4.2 cm and a S.U.V. max of 14.1 today. Compare 6.3 x 4.1 cm and a S.U.V. max of 6.8 on the prior. Incidental CT findings: Right Port-A-Cath with tip at mid right atrium. ABDOMEN/PELVIS: No abdominopelvic parenchymal or nodal hypermetabolism. Incidental CT findings: Normal adrenal glands. No splenomegaly. Trace cul-de-sac fluid on image 177/4. SKELETON: No abnormal marrow activity. Incidental CT findings: none IMPRESSION: 1. Since the prior PET of 05/21/2018, mild disease progression, as evidenced by increased activity and less so size of hypermetabolic thoracic nodes. (Deauville 4) 2. No new or extrathoracic sites of disease. 3. Significant hypermetabolic "brown" fat throughout the neck and chest, mildly degrading evaluation. Electronically Signed   By: Abigail Miyamoto M.D.   On: 08/16/2018 16:16     ASSESSMENT & PLAN:   23 y.o. male with  1. 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 Leading consideration is lymphoma, possibly Hodgkin's. Given lack of differentiating soft tissue densities, a teratoma is believed less likely. Nonseminomatous germ cell tumor given presence of small effusions might also be within differential though no pulmonary lesions are visualized. Metastatic disease, infection or inflammatory process are believed less likely.    05/08/18 CT A/P revealed No acute finding or evidence of malignancy in the abdomen.  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 No hypermetabolic lymph nodes within the neck, abdomen or pelvis.  05/22/18 BM Bx was negative   S/p 3 cycles of ABVD  08/16/18 PET/CT revealed Since the prior PET of 05/21/2018, mild disease progression, as evidenced by increased activity and less so size of hypermetabolic thoracic nodes. (Deauville 4) 2. No new or extrathoracic sites of disease. 3. Significant hypermetabolic "brown" fat throughout the neck and chest, mildly degrading evaluation   2. B/L great toe subungal hematomas - likely from chemotherapy -podiatry referral given -Patient saw Podiatry, and toe nail was removed -Continue Epsom salt soaks   PLAN:  -Discussed pt labwork today, 09/03/18; blood counts are stable, chemistries are pending -08/30/18 Viral respiratory panel is +ve for  rhinovirus -Discussed the 08/16/18 PET/CT with radiology - confirmed concern for residual Deauville 4 disease  -Discussed again with  the pt that as his lymphoma did not respond to treatment after completing 3 cycles of ABVD, I recommend switching to escalated BEACOPP at this time, which he agrees with. -Will complete 2 cycles of escalated BEACOPP, then will repeat PET/CT to gauge response and determine additional cycle(s) vs beginning RT -Will begin day 1 treatment of escalated BEACOPP tomorrow, 09/04/18 -Continue with stretching exercises -Continue with infection prevention strategies -Continue applying Neosporin to toe nail beds  -Continue Vitamin B complex -Recommend salt and baking soda mouthwashes to prevent mouth sores  -Recommended that the pt continue to eat well, drink at least 48-64 oz of water each day, and walk 20-30 minutes each day.  -Will see the pt back in one week, on D8 of treatment   Please add labs and MD visit to [facility] on 2/19. (ok to overbook if needed) F/u for rx as scheduled.   All of the patients questions were answered with apparent satisfaction. The patient knows to call the clinic with any problems, questions or concerns.  The total time spent in the appt was 30 minutes and more than 50% was on counseling and direct patient cares.    Sullivan Lone MD MS AAHIVMS [facility] Hematology/Oncology Physician [facility]  (Office):       [phone] (Work cell):  [phone] (Fax):           [phone]  09/03/2018 10:00 AM   I, Baldwin [place], am acting as a scribe for Dr. Sullivan Lone.   .I have reviewed the above documentation for accuracy and completeness, and I agree with the above. [name] MD

## 2018-09-03 NOTE — Telephone Encounter (Signed)
Printed avs and calender of upcoming appointment. Per 2/11 los 

## 2018-09-04 ENCOUNTER — Inpatient Hospital Stay (HOSPITAL_BASED_OUTPATIENT_CLINIC_OR_DEPARTMENT_OTHER): Payer: BLUE CROSS/BLUE SHIELD | Admitting: Hematology

## 2018-09-04 ENCOUNTER — Emergency Department (HOSPITAL_COMMUNITY): Payer: BLUE CROSS/BLUE SHIELD

## 2018-09-04 ENCOUNTER — Emergency Department (HOSPITAL_COMMUNITY)
Admission: EM | Admit: 2018-09-04 | Discharge: 2018-09-04 | Disposition: A | Discharge status: Home / Self Care | Payer: BLUE CROSS/BLUE SHIELD | Source: Home / Self Care | Attending: Emergency Medicine | Admitting: Emergency Medicine

## 2018-09-04 ENCOUNTER — Inpatient Hospital Stay: Payer: BLUE CROSS/BLUE SHIELD

## 2018-09-04 ENCOUNTER — Telehealth: Payer: Self-pay | Admitting: Hematology

## 2018-09-04 ENCOUNTER — Encounter (HOSPITAL_COMMUNITY): Payer: Self-pay | Admitting: Emergency Medicine

## 2018-09-04 ENCOUNTER — Telehealth: Payer: Self-pay | Admitting: *Deleted

## 2018-09-04 VITALS — BP 121/83 | HR 86 | Temp 98.4°F | Resp 18 | Ht 68.0 in

## 2018-09-04 DIAGNOSIS — C8118 Nodular sclerosis classical Hodgkin lymphoma, lymph nodes of multiple sites: Secondary | ICD-10-CM | POA: Diagnosis not present

## 2018-09-04 DIAGNOSIS — Z5111 Encounter for antineoplastic chemotherapy: Secondary | ICD-10-CM

## 2018-09-04 DIAGNOSIS — R079 Chest pain, unspecified: Secondary | ICD-10-CM

## 2018-09-04 DIAGNOSIS — I2693 Single subsegmental pulmonary embolism without acute cor pulmonale: Secondary | ICD-10-CM

## 2018-09-04 DIAGNOSIS — I2699 Other pulmonary embolism without acute cor pulmonale: Secondary | ICD-10-CM

## 2018-09-04 DIAGNOSIS — C8110 Nodular sclerosis classical Hodgkin lymphoma, unspecified site: Secondary | ICD-10-CM

## 2018-09-04 HISTORY — DX: Non-Hodgkin lymphoma, unspecified, unspecified site: C85.90

## 2018-09-04 LAB — BASIC METABOLIC PANEL
Anion gap: 7 (ref 5–15)
BUN: 13 mg/dL (ref 6–20)
CHLORIDE: 105 mmol/L (ref 98–111)
CO2: 25 mmol/L (ref 22–32)
Calcium: 9.3 mg/dL (ref 8.9–10.3)
Creatinine, Ser: 0.95 mg/dL (ref 0.61–1.24)
GFR calc Af Amer: >60 mL/min (ref >60)
GFR calc non Af Amer: >60 mL/min (ref >60)
GLUCOSE: 97 mg/dL (ref 70–99)
Potassium: 4 mmol/L (ref 3.5–5.1)
Sodium: 137 mmol/L (ref 135–145)

## 2018-09-04 LAB — CBC
HCT: 39.6 % (ref 39.0–52.0)
Hemoglobin: 13.3 g/dL (ref 13.0–17.0)
MCH: 27.1 pg (ref 26.0–34.0)
MCHC: 33.6 g/dL (ref 30.0–36.0)
MCV: 80.7 fL (ref 80.0–100.0)
Platelets: 337 10*3/uL (ref 150–400)
RBC: 4.91 MIL/uL (ref 4.22–5.81)
RDW: 16.5 % — ABNORMAL HIGH (ref 11.5–15.5)
WBC: 13.9 10*3/uL — ABNORMAL HIGH (ref 4.0–10.5)
nRBC: 0 % (ref 0.0–0.2)

## 2018-09-04 LAB — I-STAT TROPONIN, ED: Troponin i, poc: 0 ng/mL (ref 0.00–0.08)

## 2018-09-04 LAB — D-DIMER, QUANTITATIVE: D-Dimer, Quant: 0.62 ug/mL-FEU — ABNORMAL HIGH (ref 0.00–0.50)

## 2018-09-04 MED ORDER — SODIUM CHLORIDE 0.9% FLUSH
10.0000 mL | INTRAVENOUS | Status: DC | PRN
  Administered 2018-09-04: 10 mL
  Filled 2018-09-04: qty 10

## 2018-09-04 MED ORDER — HEPARIN SOD (PORK) LOCK FLUSH 100 UNIT/ML IV SOLN
500.0000 [IU] | Freq: Once | INTRAVENOUS | Status: AC | PRN
  Administered 2018-09-04: 500 [IU]
  Filled 2018-09-04: qty 5

## 2018-09-04 MED ORDER — SODIUM CHLORIDE 0.9 % IV SOLN
200.0000 mg/m2 | Freq: Once | INTRAVENOUS | Status: AC
  Administered 2018-09-04: 420 mg via INTRAVENOUS
  Filled 2018-09-04: qty 21

## 2018-09-04 MED ORDER — PROCARBAZINE HCL 50 MG PO CAPS: 100.0000 mg/m2/d | ORAL_CAPSULE | Freq: Every day | ORAL | 2 refills | Status: AC

## 2018-09-04 MED ORDER — SODIUM CHLORIDE 0.9 % IV SOLN
Freq: Once | INTRAVENOUS | Status: AC
  Administered 2018-09-04: 10:00:00 via INTRAVENOUS
  Filled 2018-09-04: qty 5

## 2018-09-04 MED ORDER — SODIUM CHLORIDE 0.9 % IV SOLN
Freq: Once | INTRAVENOUS | Status: AC
  Administered 2018-09-04: 10:00:00 via INTRAVENOUS
  Filled 2018-09-04: qty 250

## 2018-09-04 MED ORDER — PALONOSETRON HCL INJECTION 0.25 MG/5ML
INTRAVENOUS | Status: AC
  Filled 2018-09-04: qty 5

## 2018-09-04 MED ORDER — DOXORUBICIN HCL CHEMO IV INJECTION 2 MG/ML
35.0000 mg/m2 | Freq: Once | INTRAVENOUS | Status: AC
  Administered 2018-09-04: 72 mg via INTRAVENOUS
  Filled 2018-09-04: qty 36

## 2018-09-04 MED ORDER — PALONOSETRON HCL INJECTION 0.25 MG/5ML
0.2500 mg | Freq: Once | INTRAVENOUS | Status: AC
  Administered 2018-09-04: 0.25 mg via INTRAVENOUS

## 2018-09-04 MED ORDER — SODIUM CHLORIDE 0.9 % IV SOLN
1250.0000 mg/m2 | Freq: Once | INTRAVENOUS | Status: AC
  Administered 2018-09-04: 2600 mg via INTRAVENOUS
  Filled 2018-09-04: qty 130

## 2018-09-04 MED ORDER — KETOROLAC TROMETHAMINE 15 MG/ML IJ SOLN
15.0000 mg | Freq: Once | INTRAMUSCULAR | Status: AC
  Administered 2018-09-04: 15 mg via INTRAVENOUS
  Filled 2018-09-04: qty 1

## 2018-09-04 MED ORDER — IOPAMIDOL (ISOVUE-370) INJECTION 76%
100.0000 mL | Freq: Once | INTRAVENOUS | Status: AC | PRN
  Administered 2018-09-04: 80 mL via INTRAVENOUS

## 2018-09-04 MED ORDER — ENOXAPARIN SODIUM 100 MG/ML ~~LOC~~ SOLN
1.0000 mg/kg | Freq: Once | SUBCUTANEOUS | Status: AC
  Administered 2018-09-04: 90 mg via SUBCUTANEOUS
  Filled 2018-09-04: qty 0.9

## 2018-09-04 MED ORDER — IOPAMIDOL (ISOVUE-370) INJECTION 76%
INTRAVENOUS | Status: AC
  Filled 2018-09-04: qty 100

## 2018-09-04 MED ORDER — RIVAROXABAN (XARELTO) VTE STARTER PACK (15 & 20 MG): ORAL_TABLET | ORAL | 0 refills | Status: DC

## 2018-09-04 MED ORDER — SODIUM CHLORIDE (PF) 0.9 % IJ SOLN
INTRAMUSCULAR | Status: AC
  Filled 2018-09-04: qty 50

## 2018-09-04 NOTE — Telephone Encounter (Signed)
David Lam transported via wheelchair, wearing mask, patient gown, 1L IVF infusing arrived to [facility] lobby registation desk at 8:06 am.  Walk-in form offered declined by nurse requesting Triage.  Transported patient to room 1-055 door unguided. 8:08 am "[facility] patient over; he's completed ED visit but scheduled here for provider consult and chemotherapy today at 8:45 am.  Called earlier inquiring about transferring ED report before appointments, port-a-cath access, not wanting to de-accessing to be re-accessed for chemotherapy to be asked patient's zip code."     8:12 am 3x5 note reads T = 97.9, P = 79, R = 18 with O2 sat = 100 % room air, B/P = 127/90 (MAP = 102).  [name] reports "Two, new, small emboli to right upper lung.  About 7:30 am, I gave 90 mg of a Lovenox 100 mg injection subq."   Patient denies shortness of breath.  Has continued seated in wheelchair with no visible or audible respiratory distress. 8:15 am Above information and patient location shared with collaborative nurse.  E.M.R reads patient admitted.       8:22 am  Noted ED nurse with today's assigned treatment nurse.  Treatment nurse says she will carry out further needs.  ED nurse transporting patient to exam area.  IVF remain infusing.  Flush supplies given to treatment nurse.

## 2018-09-04 NOTE — Progress Notes (Signed)
HEMATOLOGY/ONCOLOGY CLINIC NOTE  Date of Service: 09/04/18     Patient Care Team: System, Pcp Not In as PCP - General  CHIEF COMPLAINTS/PURPOSE OF CONSULTATION:  Nodular sclerosis Hodgkin's Lymphoma   HISTORY OF PRESENTING ILLNESS:   David Lam is a wonderful 23 y.o. male who has been referred to [place] by Dr. 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 He is accompanied today by his mother and sister. The pt reports that he is doing well overall.   The pt presented to the ED on 05/07/18 after developing chest pain over 2 days, with positional elements. The pt was evaluated with a CXR which revealed a mediastinal mass that was biopsied on 05/10/18 and confirmed nodular sclerosis Hodgkin's lymphoma, as noted below.   The pt notes that he though his chest discomfort was due to working out, and denies noticing any swelling in the area. The pt denies any recent fevers, chills, night sweats, skin rashes, unexpected weight loss. The pt notes that his chest pain continues and is making his sleeping difficult. He denies any difficulty ambulating or SOB.   The pt reports that he has not had any prior medical problems, and has had left thumb and left ankle surgeries from injuries. 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   Of note prior to the patient's visit today, pt has had a Scalene lymph node biopsy completed on 05/10/18 with results revealing Nodular sclerosis Hodgkin's Lymphoma    The pt also had a CTA Chest on 05/07/18 which revealed Bulky, bilateral anterior superior mediastinal confluent soft tissue masses which are not calcified in appearance and which do not appear to cause occlusion or significant luminal narrowing of traversing vasculature. Leading consideration is lymphoma, possibly Hodgkin's. Given lack of differentiating soft tissue densities, a teratoma is believed less likely. Nonseminomatous germ cell tumor given presence of  small effusions might also be within differential though no pulmonary lesions are visualized. Metastatic disease, infection or inflammatory process are believed less likely.  The pt also had a CT A/P on 05/08/18 which revealed No acute finding or evidence of malignancy in the abdomen.  Most recent lab results (05/09/18) of CBC is as follows: all values are WNL except for WBC at 10.6k, HGB at 11.9, HCT at 34.7, MCV at 78.5, ANC at 7.9k, Eosinophils abs at 600. 05/08/18 Sed Rate at 19  On review of systems, pt reports chest pain, moving his bowels well, and denies skin rashes, fevers, chills, night sweats, unexpected weight loss, SOB, headaches, changes in vision, urinary pain or discomfort, abdominal pains, testicular pain or swelling, leg swelling, arm swelling, and any other symptoms.   On PMHx the pt reports left thumb and left ankle surgeries, denies blood transfusions. On Social Hx the pt denies alcohol consumption and denies smoking cigarettes or other recreational drugs. 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 Denies tattoos.  On Family Hx the pt denies any blood disorders or cancers.  He denies any medications.  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 The patient's last visit with [place] was yesterday 09/03/18. He is accompanied today by his sister.   The pt was scheduled to begin C1 escalated BEACOPP this morning, however, he presented to the ED late last night (09/03/18) with atypical and severe chest pain. He was evaluated with a CTA Chest, as noted below, which was significant for  2 right upper lobe clots. He received an injection of 90mg  Lovenox in the ED, a prescription for Xarelto, and was discharged.  The pt reports that he began feeling a sudden onset of severe chest pain that he described as "heavy," which was constant, worse with deep breathing, denies having felt SOB.  The pain began while he was sitting at home last night around 9pm. Pain radiated into left shoulder.   The pt notes that he ambulated and did not feel SOB while he was in the ED. The pt notes that currently in our visit, he is now longer having chest pain. His O2 sat is 100% on room air currently. He notes that he feels that he is breathing well and normally. He denies leg swelling.   Discussing his recent rhinovirus, he notes that he is now not having a cough and feels better related to this.  Of note since the patient's last visit, pt has had a CTA Chest completed on 09/04/18 with results revealing Positive for 2 acute subsegmental pulmonary emboli in the right upper lobe. 2. Heterogeneous aeration diffusely, likely small airways disease with air trapping given there is also generalized bronchial wall thickening. 3. 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  Lab results today (09/04/18) of CBC and BMP is as follows: all values are WNL except for WBC at 13.9k, RDW at 16.5.  On review of systems, pt reports stable energy levels, and denies current chest pain, SOB, leg swelling, arm pain, cough and any other symptoms.    MEDICAL HISTORY:  Past Medical History:  Diagnosis Date  . Non Hodgkin's lymphoma ([place])   . Non Hodgkin's lymphoma ([place])     SURGICAL HISTORY: Past Surgical History:  Procedure Laterality Date  . ANKLE SURGERY Left    cleaned up   . FRACTURE SURGERY Left    arm  . IR IMAGING GUIDED PORT INSERTION  06/03/2018  . MEDIASTINOTOMY CHAMBERLAIN MCNEIL N/A 05/10/2018   Procedure: Mediastinal mass biopsy ;  Surgeon: Grace Isaac, MD;  Location: [facility] OR;  Service: Thoracic;  Laterality: N/A;  . ORIF FINGER / THUMB FRACTURE Left     SOCIAL HISTORY: Social History   Socioeconomic History  . Marital status: Unknown    Spouse name: Not on file  . Number of children: Not on file  . Years of education: Not on file  . Highest education level: Not on file    Occupational History  . Not on file  Social Needs  . Financial resource strain: Not on file  . Food insecurity:    Worry: Not on file    Inability: Not on file  . Transportation needs:    Medical: Not on file    Non-medical: Not on file  Tobacco Use  . Smoking status: Never Smoker  . Smokeless tobacco: Never Used  Substance and Sexual Activity  . Alcohol use: Never    Frequency: Never  . Drug use: Never  . Sexual activity: Not on file  Lifestyle  . Physical activity:    Days per week: Not on file    Minutes per session: Not on file  . Stress: Not on file  Relationships  . Social connections:    Talks on phone: Not on file    Gets together: Not on file    Attends religious service: Not on file    Active member of club or organization: Not on file    Attends meetings of clubs or organizations: Not  on file    Relationship status: Not on file  . Intimate partner violence:    Fear of current or ex partner: Not on file    Emotionally abused: Not on file    Physically abused: Not on file    Forced sexual activity: Not on file  Other Topics Concern  . Not on file  Social History Narrative  . Not on file    FAMILY HISTORY: No family history on file.  ALLERGIES:  has No Known Allergies.  MEDICATIONS:  Current Outpatient Medications  Medication Sig Dispense Refill  . cephALEXin (KEFLEX) 500 MG capsule Take 1 capsule (500 mg total) by mouth 3 (three) times daily. (Patient not taking: Reported on 09/04/2018) 21 capsule 0  . diphenhydrAMINE (BENADRYL) 25 mg capsule Take 25 mg by mouth as needed.    . lidocaine-prilocaine (EMLA) cream Apply 1 application topically as needed. 30 g 0  . LORazepam (ATIVAN) 0.5 MG tablet Take 1 tablet (0.5 mg total) by mouth every 6 (six) hours as needed (Nausea or vomiting). 30 tablet 0  . ondansetron (ZOFRAN) 8 MG tablet Take 1 tablet (8 mg total) by mouth 2 (two) times daily as needed. Start on the third day after doxorubicin/cytoxan  chemotherapy. 30 tablet 1  . predniSONE (DELTASONE) 20 MG tablet Take 3 tablets (60 mg total) by mouth daily with breakfast. Take daily on days 1-14 of chemotherapy. 50 tablet 1  . procarbazine (MATULANE) 50 MG capsule Take 4 capsules (200 mg total) by mouth daily. Take daily on days 1-7 of each 21 day cycle. Follow low tyramine diet. 28 capsule 2  . prochlorperazine (COMPAZINE) 10 MG tablet Take 1 tablet (10 mg total) by mouth every 6 (six) hours as needed for nausea or vomiting. 30 tablet 1  . Rivaroxaban 15 & 20 MG TBPK Take as directed on package: Start with one 15mg  tablet by mouth twice a day with food. On Day 22, switch to one 20mg  tablet once a day with food. 51 each 0   No current facility-administered medications for this visit.    Facility-Administered Medications Ordered in Other Visits  Medication Dose Route Frequency Provider Last Rate Last Dose  . 0.9 %  sodium chloride infusion   Intravenous Once [name], MD      . cyclophosphamide (CYTOXAN) 2,600 mg in sodium chloride 0.9 % 500 mL chemo infusion  1,250 mg/m2 (Treatment Plan Recorded) Intravenous Once [name], MD      . DOXOrubicin (ADRIAMYCIN) chemo injection 72 mg  35 mg/m2 (Treatment Plan Recorded) Intravenous Once [name], MD      . etoposide (VEPESID) 420 mg in sodium chloride 0.9 % 1,250 mL chemo infusion  200 mg/m2 (Treatment Plan Recorded) Intravenous Once [name], MD      . fosaprepitant (EMEND) 150 mg, dexamethasone (DECADRON) 12 mg in sodium chloride 0.9 % 145 mL IVPB   Intravenous Once [name], MD      . heparin lock flush 100 unit/mL  500 Units Intracatheter Once PRN [name], MD      . palonosetron (ALOXI) injection 0.25 mg  0.25 mg Intravenous Once [name], MD      . sodium chloride flush (NS) 0.9 % injection 10 mL  10 mL Intracatheter PRN [name], MD        REVIEW OF SYSTEMS:    A 10+ POINT REVIEW OF SYSTEMS WAS  OBTAINED including neurology, dermatology, psychiatry, cardiac, respiratory, lymph,  extremities, GI, GU, Musculoskeletal, constitutional, breasts, reproductive, HEENT.  All pertinent positives are noted in the HPI.  All others are negative.   PHYSICAL EXAMINATION: ECOG PERFORMANCE STATUS: 1 - Symptomatic but completely ambulatory  . Vitals:   09/04/18 0841  BP: 121/83  Pulse: 86  Resp: 18  Temp: 98.4 F (36.9 C)  SpO2: 100%   There were no vitals filed for this visit. .Body mass index is 29.8 kg/m.  GENERAL:alert, in no acute distress and comfortable SKIN: no acute rashes, no significant lesions EYES: conjunctiva are pink and non-injected, sclera anicteric OROPHARYNX: MMM, no exudates, no oropharyngeal erythema or ulceration NECK: supple, no JVD LYMPH:  no palpable lymphadenopathy in the cervical, axillary or inguinal regions LUNGS: clear to auscultation b/l with normal respiratory effort HEART: regular rate & rhythm ABDOMEN:  normoactive bowel sounds , non tender, not distended. No palpable hepatosplenomegaly.  Extremity: no pedal edema PSYCH: alert & oriented x 3 with fluent speech NEURO: no focal motor/sensory deficits   LABORATORY DATA:  I have reviewed the data as listed  . CBC Latest Ref Rng & Units 09/04/2018 09/03/2018 08/30/2018  WBC 4.0 - 10.5 K/uL 13.9(H) 13.5(H) 10.3  Hemoglobin 13.0 - 17.0 g/dL 13.3 13.6 12.8(L)  Hematocrit 39.0 - 52.0 % 39.6 40.2 38.2(L)  Platelets 150 - 400 K/uL 337 334 322    . CMP Latest Ref Rng & Units 09/04/2018 09/03/2018 08/30/2018  Glucose 70 - 99 mg/dL 97 106(H) 93  BUN 6 - 20 mg/dL 13 13 18   Creatinine 0.61 - 1.24 mg/dL 0.95 1.02 1.05  Sodium 135 - 145 mmol/L 137 141 137  Potassium 3.5 - 5.1 mmol/L 4.0 3.8 4.1  Chloride 98 - 111 mmol/L 105 106 105  CO2 22 - 32 mmol/L 25 25 25   Calcium 8.9 - 10.3 mg/dL 9.3 9.5 9.3  Total Protein 6.5 - 8.1 g/dL - 7.9 7.8  Total Bilirubin 0.3 - 1.2 mg/dL - 0.5 0.6  Alkaline Phos 38 - 126 U/L - 90 88   AST 15 - 41 U/L - 17 15  ALT 0 - 44 U/L - 20 21   05/10/18 Biopsy:   05/22/18 BM Bx:    RADIOGRAPHIC STUDIES: I have personally reviewed the radiological images as listed and agreed with the findings in the report. Dg Chest 2 View  Result Date: 09/04/2018 CLINICAL DATA:  Chest pain, history of Hodgkin's lymphoma EXAM: CHEST - 2 VIEW COMPARISON:  08/16/2018 PET-CT FINDINGS: Cardiac shadow is within normal limits. Increased soft tissue density is along the right hilum and right paratracheal region as well as the superior mediastinum on the left consistent with the previously seen lymph nodes. Lungs are clear. No bony abnormality is noted. Right chest wall port is seen. IMPRESSION: Changes consistent with mediastinal adenopathy similar to that seen on prior PET-CT. No acute abnormality is noted. Electronically Signed   By: Inez Catalina M.D.   On: 09/04/2018 02:32   Ct Angio Chest Pe W And/or Wo Contrast  Result Date: 09/04/2018 CLINICAL DATA:  Chest pain. EXAM: CT ANGIOGRAPHY CHEST WITH CONTRAST TECHNIQUE: Multidetector CT imaging of the chest was performed using the standard protocol during bolus administration of intravenous contrast. Multiplanar CT image reconstructions and MIPs were obtained to evaluate the vascular anatomy. CONTRAST:  80mL ISOVUE-370 IOPAMIDOL (ISOVUE-370) INJECTION 76% COMPARISON:  08/16/2018 PET-CT FINDINGS: Cardiovascular: 2 branching central pulmonary filling defects seen in subsegmental right upper lobe branches. No additional acute pulmonary embolism is seen. Normal heart size. No pericardial effusion. Porta  catheter on the right with tip at the upper right atrium. 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 Lungs/Pleura: Generalized airway thickening and mosaic lung attenuation. This is likely accentuated by expiratory imaging. Similar findings were seen on 2020 PET-CT, although better inflated at that time. Even though there is acute  pulmonary embolism this is most likely due to small airways disease given the extent greater than visible PE and the airway thickening. Upper Abdomen: Negative Musculoskeletal: Negative Critical Value/emergent results were called by telephone at the time of interpretation on 09/04/2018 at 6:19 am to Dr. Addison Lank , who verbally acknowledged these results. Review of the MIP images confirms the above findings. IMPRESSION: 1. Positive for 2 acute subsegmental pulmonary emboli in the right upper lobe. 2. Heterogeneous aeration diffusely, likely small airways disease with air trapping given there is also generalized bronchial wall thickening. 3. 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 Electronically Signed   By: Monte Fantasia M.D.   On: 09/04/2018 06:20   Nm Pet Image Restag (ps) Skull Base To Thigh  Result Date: 08/16/2018 CLINICAL DATA:  Subsequent treatment strategy for restaging of lymphoma. EXAM: NUCLEAR MEDICINE PET SKULL BASE TO THIGH TECHNIQUE: 10.9 mCi F-18 FDG was injected intravenously. Full-ring PET imaging was performed from the skull base to thigh after the radiotracer. CT data was obtained and used for attenuation correction and anatomic localization. Fasting blood glucose: 92 mg/dl COMPARISON:  PET of 05/21/2018. FINDINGS: Mediastinal blood pool activity: SUV max 2.1 NECK: Significant, marked hypermetabolic brown fat throughout the neck and chest. Given this limitation, no hypermetabolic nodes within the neck. Incidental CT findings: No gross cervical adenopathy. Paucity of fat degrades evaluation. CHEST: Redemonstration of extensive hypermetabolic thoracic adenopathy. Index right paratracheal node measures 1.6 cm and a S.U.V. max of 10.3 today versus similar in size and a S.U.V. max of 7.7 on the prior. A prevascular nodal mass measures on the order of 3.7 by 4.7 cm and a S.U.V. max of 6.3 today. Compare 3.6 x 4.2 cm and a S.U.V. max of 4.4 on the prior. Right cardiophrenic angle  mass measures on the order of 7.3 x 4.2 cm and a S.U.V. max of 14.1 today. Compare 6.3 x 4.1 cm and a S.U.V. max of 6.8 on the prior. Incidental CT findings: Right Port-A-Cath with tip at mid right atrium. ABDOMEN/PELVIS: No abdominopelvic parenchymal or nodal hypermetabolism. Incidental CT findings: Normal adrenal glands. No splenomegaly. Trace cul-de-sac fluid on image 177/4. SKELETON: No abnormal marrow activity. Incidental CT findings: none IMPRESSION: 1. Since the prior PET of 05/21/2018, mild disease progression, as evidenced by increased activity and less so size of hypermetabolic thoracic nodes. (Deauville 4) 2. No new or extrathoracic sites of disease. 3. Significant hypermetabolic "brown" fat throughout the neck and chest, mildly degrading evaluation. Electronically Signed   By: Abigail Miyamoto M.D.   On: 08/16/2018 16:16    ASSESSMENT & PLAN:   23 y.o. male with  1. 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 Leading consideration is lymphoma, possibly Hodgkin's. Given lack of differentiating soft tissue densities, a teratoma is believed less likely. Nonseminomatous germ cell tumor given presence of small effusions might also be within differential though no pulmonary lesions are visualized. Metastatic disease, infection or inflammatory process are believed  less likely.    05/08/18 CT A/P revealed No acute finding or evidence of malignancy in the abdomen.  05/08/18 ECHO revealed LV EF of 55-60%   05/10/18 Scalene lymph node Biopsy revealed Nodular sclerosis Hodgkin's Lymphoma   05/08/18 ECHO revealed LV EF of 55-60%  05/16/18 Hep B, Hep C, and HIV labs all negative   05/21/18 PET/CT revealed Hypermetabolic mediastinal adenopathy  is identified compatible with history of lymphoma. No hypermetabolic lymph nodes within the neck, abdomen or pelvis.  05/22/18 BM Bx was negative   S/p 3 cycles of ABVD  08/16/18 PET/CT revealed Since the prior PET of 05/21/2018, mild disease progression, as evidenced by increased activity and less so size of hypermetabolic thoracic nodes. (Deauville 4) 2. No new or extrathoracic sites of disease. 3. Significant hypermetabolic "brown" fat throughout the neck and chest, mildly degrading evaluation   2. B/L great toe subungal hematomas - likely from chemotherapy -podiatry referral given -Patient saw Podiatry, and toe nail was removed -Continue Epsom salt soaks   PLAN: -Discussed pt labwork today, 09/04/18; blood counts are stable and PLT are normal at 337k. Chemistries normal. -Discussed the 09/04/18 CTA Chest which revealed Positive for 2 acute subsegmental pulmonary emboli in the right upper lobe. 2. Heterogeneous aeration diffusely, likely small airways disease with air trapping given there is also generalized bronchial wall thickening. 3. 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  -Tumor as risk factor for PE -Discussed that the size of the clots are small enough to consider Xarelto as opposed to Lovenox. The pt prefers to begin Xarelto which he has a prescription for. He will begin Xarelto at [employer], since he received 90mg  Lovenox at 8am this morning -Will set pt up for US Venous BLE -Pt will avoid NSAIDs. Tylenol as needed. -Discussed that given the small size of the clots, and the patient's clinically stable symptoms, that he is not prohibited from beginning C1 escalated BEACOPP today, which he does prefer to begin  -08/30/18 Viral respiratory panel was positive for rhinovirus  -Discussed the 08/16/18 PET/CT with radiology - confirmed concern for residual Deauville 4 disease  -Will complete 2 cycles of escalated BEACOPP, then will repeat PET/CT to gauge response and determine  additional cycle(s) vs beginning RT -Continue with stretching exercises -Continue with infection prevention strategies -Continue applying Neosporin to toe nail beds  -Continue Vitamin B complex -Recommend salt and baking soda mouthwashes to prevent mouth sores -Will see the pt back in one week   All of the patients questions were answered with apparent satisfaction. The patient knows to call the clinic with any problems, questions or concerns.  The total time spent in the appt was 25 minutes and more than 50% was on counseling and direct patient cares.    Sullivan Lone MD MS AAHIVMS [facility] Hematology/Oncology Physician [facility]  (Office):       [phone] (Work cell):  [phone] (Fax):           [phone]  09/04/2018 9:39 AM   I, Baldwin Jamaica, am acting as a scribe for Dr. Sullivan Lone.   .I have reviewed the above documentation for accuracy and completeness, and I agree with the above. [name] MD

## 2018-09-04 NOTE — ED Notes (Signed)
Patient has been transported to [place].

## 2018-09-04 NOTE — Telephone Encounter (Signed)
Oral Chemotherapy Pharmacist Encounter   I spoke with patient and sister, Janett Billow, in infusion room for overview of new oral chemotherapy medication: Matulane (procarbazine) for the treatment of  nodular sclerosis Hodgkin's lymphoma, Deauville 4 after 3 cycles of ABVD, in conjunction with bleomycin, etoposide, doxorubicin, cyclophosphamide, vincristine, and prednisone (escalated BEACOPP), planned duration up to 8 cycles.  Current plan is to administer 2 cycles and then perform PET scan to drive continued treatment chioce.   Counseled patient on administration, dosing, side effects, monitoring, drug-food interactions, safe handling, storage, and disposal.  Patient will take Matulane (procarbazine) 68m capsules, 4 capsules (2024m by mouth once daily for 7 days out of every 21 day cycle.  Patient informed procarbazine carries a moderate - high emetogenic potential and will administer prochlorperazine 1096m0-60 min prior to procarbazine administration. He will transition to ondansetron 8mg34m-60 min prior to procarbazine administration on day 3 of chemotherapy.  Patient informed to follow a low tyramine diet while on procarbazine by avoiding food such as wine, tap/draft beers, caffeine, yogurt, ripe cheese, bananas, air-dried or cured meats (salami, pepperoni, sausage), fava or broad bean pods, sauerkraut, and soysauce.  Patient informed ethanol use may induce disulfiram reaction with symptoms including, but not limited to nausea, vomiting, flushing, headache, chest or abdominal discomfort, and general hangover-like symptoms.  Procarbazine start date: 09/05/2018  Side effects include but not limited to: decreased blood counts, nausea, vomiting, diarrhea, constipation, mouth sores, infertility, alopecia, skin rash, CNS toxicities (parethesias, neuropathies, confusion), and bleeding issues (hemorrhade, petechia).   Rare but serious adverse effect of secondary malignancy also discussed.  Reviewed  with patient importance of keeping a medication schedule and plan for any missed doses.  Mr. SharKahrsced understanding and appreciation.   Medication reconciliation performed and medication/allergy list updated.  Patient informed that [place] [facility] [place] on [place] is [phone] are currently working with dispensing pharmacy to expedite this 1st shipment of the MatuAckermanatient's sister, myself, and [place] Sinneremotherapy education RN) met separately in chemotherapy education room to review treatment schedule, appointment schedule, and patient's medication list.  All questions answered.  Patient knows to call the office with questions or concerns. Oral Oncology Clinic will continue to follow.  JessJohny DrillingarmD, BCPS, BCOP 09/04/2018  11:43 AM Oral Oncology Clinic [phone]

## 2018-09-04 NOTE — ED Notes (Addendum)
Pt remains on cardiac monitor in triage while awaiting treatment rm

## 2018-09-04 NOTE — Telephone Encounter (Signed)
No los per 2/12.

## 2018-09-04 NOTE — Patient Instructions (Signed)
[facility] Discharge Instructions for Patients Receiving Chemotherapy  Today you received the following chemotherapy agents adriamycin/cytoxan/etoposide   To help prevent nausea and vomiting after your treatment, we encourage you to take your nausea medication as directed  If you develop nausea and vomiting that is not controlled by your nausea medication, call the clinic.   BELOW ARE SYMPTOMS THAT SHOULD BE REPORTED IMMEDIATELY:  *FEVER GREATER THAN 100.5 F  *CHILLS WITH OR WITHOUT FEVER  NAUSEA AND VOMITING THAT IS NOT CONTROLLED WITH YOUR NAUSEA MEDICATION  *UNUSUAL SHORTNESS OF BREATH  *UNUSUAL BRUISING OR BLEEDING  TENDERNESS IN MOUTH AND THROAT WITH OR WITHOUT PRESENCE OF ULCERS  *URINARY PROBLEMS  *BOWEL PROBLEMS  UNUSUAL RASH Items with * indicate a potential emergency and should be followed up as soon as possible.  Feel free to call the clinic you have any questions or concerns. The clinic phone number is [phone].

## 2018-09-04 NOTE — ED Provider Notes (Signed)
[facility] DEPT Provider Note  CSN: [id number] Arrival date & time: 09/03/18 2257  Chief Complaint(s) Chest Pain  HPI Chirstopher Lam is a 23 y.o. male   The history is provided by the patient.  Chest Pain  Chest pain location: sternal. Pain quality: stabbing   Pain radiates to:  Does not radiate Pain severity:  Severe Duration:  5 hours Timing:  Constant Progression:  Waxing and waning Chronicity:  Recurrent Context: at rest   Relieved by:  Certain positions (lying on his side) Worsened by:  Movement and certain positions Associated symptoms: cough (1 day - 2 days ago, resolved)   Associated symptoms: no diaphoresis, no fever, no nausea, no shortness of breath and no vomiting   Risk factors: male sex   Risk factors: no birth control, no coronary artery disease, no diabetes mellitus, no high cholesterol, no hypertension and no prior DVT/PE   Risk factors comment:  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  Feels like soreness and exacerbated with range of motion and palpation of the left shoulder.  Past Medical History Past Medical History:  Diagnosis Date  . Non Hodgkin's lymphoma ([place] [facility])   . Non Hodgkin's lymphoma [facility])    Patient Active Problem List   Diagnosis Date Noted  . Hodgkin's lymphoma ([place]) 08/30/2018  . Port-A-Cath in place 07/05/2018  . Hodgkin lymphoma ([place]) 06/05/2018  . Chest pain 05/07/2018  . 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 Rupture of radial collateral ligament of left thumb 12/30/2014  . Left ankle pain 11/16/2014  . Closed fracture of shaft of left radius and ulna 10/31/2014  . Dislocation of carpometacarpal joint of left thumb 10/31/2014   Home Medication(s) Prior to Admission medications   Medication Sig Start Date End Date Taking? Authorizing Provider  diphenhydrAMINE (BENADRYL) 25 mg capsule Take 25 mg by mouth as needed.   Yes [provider]    lidocaine-prilocaine (EMLA) cream Apply 1 application topically as needed. 06/04/18  Yes [name], MD  LORazepam (ATIVAN) 0.5 MG tablet Take 1 tablet (0.5 mg total) by mouth every 6 (six) hours as needed (Nausea or vomiting). 08/30/18  Yes [name], MD  prochlorperazine (COMPAZINE) 10 MG tablet Take 1 tablet (10 mg total) by mouth every 6 (six) hours as needed for nausea or vomiting. 07/19/18  Yes [name], MD  cephALEXin (KEFLEX) 500 MG capsule Take 1 capsule (500 mg total) by mouth 3 (three) times daily. Patient not taking: Reported on 09/04/2018 06/18/18   Sandi Mealy E., PA-C  ondansetron (ZOFRAN) 8 MG tablet Take 1 tablet (8 mg total) by mouth 2 (two) times daily as needed. Start on the third day after doxorubicin/cytoxan chemotherapy. 08/30/18   [name], MD  predniSONE (DELTASONE) 20 MG tablet Take 3 tablets (60 mg total) by mouth daily with breakfast. Take daily on days 1-14 of chemotherapy. 08/30/18   [name], MD  procarbazine (MATULANE) 50 MG capsule Take 4 capsules (200 mg total) by mouth daily. Take daily on days 1-7 of each 21 day cycle. Follow low tyramine diet. 09/03/18   [name], MD  Rivaroxaban 15 & 20 MG TBPK Take as directed on package: Start with one 15mg  tablet by mouth twice a day with food. On Day 22, switch to one 20mg  tablet once a day with food. 09/04/18   Fatima Blank, MD  Past Surgical History Past Surgical History:  Procedure Laterality Date  . ANKLE SURGERY Left    cleaned up   . FRACTURE SURGERY Left    arm  . IR IMAGING GUIDED PORT INSERTION  06/03/2018  . MEDIASTINOTOMY CHAMBERLAIN MCNEIL N/A 05/10/2018   Procedure: Mediastinal mass biopsy ;  Surgeon: Grace Isaac, MD;  Location: [facility] OR;  Service: Thoracic;  Laterality: N/A;  . ORIF FINGER / THUMB  FRACTURE Left    Family History No family history on file.  Social History Social History   Tobacco Use  . Smoking status: Never Smoker  . Smokeless tobacco: Never Used  Substance Use Topics  . Alcohol use: Never    Frequency: Never  . Drug use: Never   Allergies Patient has no known allergies.  Review of Systems Review of Systems  Constitutional: Negative for diaphoresis and fever.  Respiratory: Positive for cough (1 day - 2 days ago, resolved). Negative for shortness of breath.   Cardiovascular: Positive for chest pain.  Gastrointestinal: Negative for nausea and vomiting.   All other systems are reviewed and are negative for acute change except as noted in the HPI  Physical Exam Vital Signs  I have reviewed the triage vital signs BP 121/71   Pulse 83   Temp 97.6 F (36.4 C) (Oral)   Resp 19   Ht 5\' 8"  (1.727 m)   Wt 88.9 kg   SpO2 97%   BMI 29.80 kg/m   Physical Exam Vitals signs reviewed.  Constitutional:      General: He is not in acute distress.    Appearance: He is well-developed. He is not diaphoretic.  HENT:     Head: Normocephalic and atraumatic.     Nose: Nose normal.  Eyes:     General: No scleral icterus.       Right eye: No discharge.        Left eye: No discharge.     Conjunctiva/sclera: Conjunctivae normal.     Pupils: Pupils are equal, round, and reactive to light.  Neck:     Musculoskeletal: Normal range of motion and neck supple.  Cardiovascular:     Rate and Rhythm: Normal rate and regular rhythm.     Heart sounds: No murmur. No friction rub. No gallop.   Pulmonary:     Effort: Pulmonary effort is normal. No respiratory distress.     Breath sounds: Normal breath sounds. No stridor. No rales.  Chest:     Chest wall: Tenderness present.    Abdominal:     General: There is no distension.     Palpations: Abdomen is soft.     Tenderness: There is no abdominal tenderness.  Musculoskeletal:        General: No tenderness.  Skin:     General: Skin is warm and dry.     Findings: No erythema or rash.  Neurological:     Mental Status: He is alert and oriented to person, place, and time.     ED Results and Treatments Labs (all labs ordered are listed, but only abnormal results are displayed) Labs Reviewed  CBC - Abnormal; Notable for the following components:      Result Value   WBC 13.9 (*)    RDW 16.5 (*)    All other components within normal limits  D-DIMER, QUANTITATIVE (NOT AT [facility]) - Abnormal; Notable for the following components:   D-Dimer, Quant 0.62 (*)    All other components within normal limits  BASIC METABOLIC PANEL  I-STAT TROPONIN, ED                                                                                                                         EKG  EKG Interpretation  Date/Time:  Tuesday September 03 2018 23:07:18 EST Ventricular Rate:  89 PR Interval:    QRS Duration: 88 QT Interval:  354 QTC Calculation: 431 R Axis:   71 Text Interpretation:  Sinus rhythm No significant change since last tracing Confirmed by Addison Lank [id number]) on 09/04/2018 2:05:21 AM Also confirmed by Addison Lank [id number]), editor Philomena Doheny [phone])  on 09/04/2018 7:00:53 AM      Radiology Dg Chest 2 View  Result Date: 09/04/2018 CLINICAL DATA:  Chest pain, history of Hodgkin's lymphoma EXAM: CHEST - 2 VIEW COMPARISON:  08/16/2018 PET-CT FINDINGS: Cardiac shadow is within normal limits. Increased soft tissue density is along the right hilum and right paratracheal region as well as the superior mediastinum on the left consistent with the previously seen lymph nodes. Lungs are clear. No bony abnormality is noted. Right chest wall port is seen. IMPRESSION: Changes consistent with mediastinal adenopathy similar to that seen on prior PET-CT. No acute abnormality is noted. Electronically Signed   By: Inez Catalina M.D.   On: 09/04/2018 02:32   Ct Angio Chest Pe W And/or Wo Contrast  Result Date: 09/04/2018 CLINICAL  DATA:  Chest pain. EXAM: CT ANGIOGRAPHY CHEST WITH CONTRAST TECHNIQUE: Multidetector CT imaging of the chest was performed using the standard protocol during bolus administration of intravenous contrast. Multiplanar CT image reconstructions and MIPs were obtained to evaluate the vascular anatomy. CONTRAST:  56mL ISOVUE-370 IOPAMIDOL (ISOVUE-370) INJECTION 76% COMPARISON:  08/16/2018 PET-CT FINDINGS: Cardiovascular: 2 branching central pulmonary filling defects seen in subsegmental right upper lobe branches. No additional acute pulmonary embolism is seen. Normal heart size. No pericardial effusion. Porta catheter on the right with tip at the upper right atrium. 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 Lungs/Pleura: Generalized airway thickening and mosaic lung attenuation. This is likely accentuated by expiratory imaging. Similar findings were seen on 2020 PET-CT, although better inflated at that time. Even though there is acute pulmonary embolism this is most likely due to small airways disease given the extent greater than visible PE and the airway thickening. Upper Abdomen: Negative Musculoskeletal: Negative Critical Value/emergent results were called by telephone at the time of interpretation on 09/04/2018 at 6:19 am to Dr. Addison Lank , who verbally acknowledged these results. Review of the MIP images confirms the above findings. IMPRESSION: 1. Positive for 2 acute subsegmental pulmonary emboli in the right upper lobe. 2. Heterogeneous aeration diffusely, likely small airways disease with air trapping given there is also generalized bronchial wall thickening. 3. 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 Electronically Signed   By: Monte Fantasia M.D.   On: 09/04/2018 06:20   Pertinent labs & imaging results that were available during my care of the  patient were reviewed by me and considered in my medical decision making (see chart for details).  Medications  Ordered in ED Medications  sodium chloride (PF) 0.9 % injection (0 mLs  Hold 09/04/18 0718)  iopamidol (ISOVUE-370) 76 % injection (  Hold 09/04/18 0718)  ketorolac (TORADOL) 15 MG/ML injection 15 mg (15 mg Intravenous Given 09/04/18 0313)  iopamidol (ISOVUE-370) 76 % injection 100 mL (80 mLs Intravenous Contrast Given 09/04/18 0549)  enoxaparin (LOVENOX) injection 90 mg (90 mg Subcutaneous Given 09/04/18 6283)                                                                                                                                    Procedures Procedures  (including critical care time)  Medical Decision Making / ED Course I have reviewed the nursing notes for this encounter and the patient's prior records (if available in EHR or on provided paperwork).    Patient presents with highly atypical chest pain most consistent with chest wall pain.  EKG without acute ischemic changes or evidence of pericarditis.  Troponin negative.  Doubt ACS.  Low suspicion for pulmonary embolism but cannot PERC due to history of non-Hodgkin's lymphoma.  D-dimer obtained and noted to be mildly elevated.  CTA did reveal 2 small right upper lobe clots.   Patient is hemodynamically stable without hypoxia or tachycardia.  No acute distress.  Renal function intact and without thrombocytopenia or anemia.  I offered patient admission versus outpatient management.  Patient is requesting outpatient management if oncology agrees.  I discussed the case with the on-call oncologist who did agree with outpatient management.  She recommended first dose of Lovenox in the emergency department and prescription for Xarelto.  However patient will need to follow-up closely with Dr. Irene Limbo either today or tomorrow to discuss further management and anticoagulation.  Patient is amicable with this plan.  First dose of Lovenox given in the emergency department.  Final Clinical Impression(s) / ED Diagnoses Final diagnoses:  Chest pain    Single subsegmental pulmonary embolism without acute cor pulmonale   Disposition: Discharge  Condition: Good  I have discussed the results, Dx and Tx plan with the patient who expressed understanding and agree(s) with the plan. Discharge instructions discussed at great length. The patient was given strict return precautions who verbalized understanding of the instructions. No further questions at time of discharge.    ED Discharge Orders         Ordered    Rivaroxaban 15 & 20 MG TBPK     09/04/18 0736           Follow Up: [name], MD [place] [address] [phone]  Go today For close follow-up to discuss anticoagulation for pulmonary embolism and upcoming chemotherapy.      This chart was dictated using voice recognition software.  Despite best efforts to proofread,  errors can occur which can  change the documentation meaning.   Fatima Blank, MD 09/04/18 [phone]

## 2018-09-04 NOTE — ED Notes (Signed)
Patient port has been accessed. Patient will be going to cancer center for Chemo and consultation to Oncology MD. ED Provider stated that it is ok to hold patient in room until cancer center can be called and report given so patient does not have to have port de-accessed and accessed a 2nd time. RN will call cancer center ASAP.

## 2018-09-05 ENCOUNTER — Inpatient Hospital Stay: Payer: BLUE CROSS/BLUE SHIELD

## 2018-09-05 ENCOUNTER — Inpatient Hospital Stay: Payer: BLUE CROSS/BLUE SHIELD | Admitting: Nutrition

## 2018-09-05 VITALS — BP 138/73 | HR 95 | Temp 98.7°F | Resp 18

## 2018-09-05 DIAGNOSIS — C8118 Nodular sclerosis classical Hodgkin lymphoma, lymph nodes of multiple sites: Secondary | ICD-10-CM

## 2018-09-05 DIAGNOSIS — C8110 Nodular sclerosis classical Hodgkin lymphoma, unspecified site: Secondary | ICD-10-CM

## 2018-09-05 MED ORDER — SODIUM CHLORIDE 0.9 % IV SOLN
Freq: Once | INTRAVENOUS | Status: AC
  Administered 2018-09-05: 16:00:00 via INTRAVENOUS
  Filled 2018-09-05: qty 250

## 2018-09-05 MED ORDER — DEXAMETHASONE SODIUM PHOSPHATE 10 MG/ML IJ SOLN
10.0000 mg | Freq: Once | INTRAMUSCULAR | Status: AC
  Administered 2018-09-05: 10 mg via INTRAVENOUS

## 2018-09-05 MED ORDER — HEPARIN SOD (PORK) LOCK FLUSH 100 UNIT/ML IV SOLN
500.0000 [IU] | Freq: Once | INTRAVENOUS | Status: AC | PRN
  Administered 2018-09-05: 500 [IU]
  Filled 2018-09-05: qty 5

## 2018-09-05 MED ORDER — DEXAMETHASONE SODIUM PHOSPHATE 10 MG/ML IJ SOLN
INTRAMUSCULAR | Status: AC
  Filled 2018-09-05: qty 1

## 2018-09-05 MED ORDER — SODIUM CHLORIDE 0.9% FLUSH
10.0000 mL | INTRAVENOUS | Status: DC | PRN
  Administered 2018-09-05: 10 mL
  Filled 2018-09-05: qty 10

## 2018-09-05 MED ORDER — SODIUM CHLORIDE 0.9 % IV SOLN
200.0000 mg/m2 | Freq: Once | INTRAVENOUS | Status: AC
  Administered 2018-09-05: 420 mg via INTRAVENOUS
  Filled 2018-09-05: qty 21

## 2018-09-05 NOTE — Telephone Encounter (Signed)
Oral Oncology Pharmacist Encounter  Received voicemail from patient's sister, David Lam, on 09/04/2018 with information that they had gone to pick up prescription for Xarelto from the pharmacy and copayment for Xarelto was prohibitively expensive. I called David Lam back and LVM with information that they can visit website [email or web address] where she can enroll patient for co-pay coupon to reduce out-of-pocket expense to $10 monthly.  Received voicemail from patient sister, David Lam, today that they were successful in obtaining manufacturer co-pay coupon for the Xarelto and were able to pick that up from the pharmacy for $10 copayment. She also stated that they were in touch with procarbazine dispensing pharmacy yesterday and will be receiving patient's first shipment of procarbazine today.  She expressed appreciation for all of the work handled by oral oncology clinic and will continue to reach out as issues arrive.  David Lam, PharmD, BCPS, BCOP  09/05/2018 12:21 PM Oral Oncology Clinic [phone]

## 2018-09-05 NOTE — Patient Instructions (Signed)
[facility] Discharge Instructions for Patients Receiving Chemotherapy  Today you received the following chemotherapy agents Etoposide (VEPESIDE).  To help prevent nausea and vomiting after your treatment, we encourage you to take your nausea medication as prescribed.   If you develop nausea and vomiting that is not controlled by your nausea medication, call the clinic.   BELOW ARE SYMPTOMS THAT SHOULD BE REPORTED IMMEDIATELY:  *FEVER GREATER THAN 100.5 F  *CHILLS WITH OR WITHOUT FEVER  NAUSEA AND VOMITING THAT IS NOT CONTROLLED WITH YOUR NAUSEA MEDICATION  *UNUSUAL SHORTNESS OF BREATH  *UNUSUAL BRUISING OR BLEEDING  TENDERNESS IN MOUTH AND THROAT WITH OR WITHOUT PRESENCE OF ULCERS  *URINARY PROBLEMS  *BOWEL PROBLEMS  UNUSUAL RASH Items with * indicate a potential emergency and should be followed up as soon as possible.  Feel free to call the clinic should you have any questions or concerns. The clinic phone number is [phone].  Please show the [facility] at check-in to the Emergency Department and triage nurse.

## 2018-09-05 NOTE — Progress Notes (Signed)
Patient was educated on a low tyramine diet secondary to being on procarbazine.  Education was completed with both patient and his sister who is helping care for patient.  Both patient and sister asked good questions and appeared to have a good understanding of low tyramine diet.  Fact sheets were provided and contact information was given.  Encouraged patient/sister to contact me for any further questions or concerns.

## 2018-09-05 NOTE — Progress Notes (Signed)
Per Pharmacy, okay to administer patient's etoposide whenever it comes done to infusion room.

## 2018-09-06 ENCOUNTER — Inpatient Hospital Stay: Payer: BLUE CROSS/BLUE SHIELD

## 2018-09-06 ENCOUNTER — Ambulatory Visit: Payer: BLUE CROSS/BLUE SHIELD

## 2018-09-06 VITALS — BP 141/91 | HR 87 | Temp 98.5°F | Resp 18

## 2018-09-06 DIAGNOSIS — C8118 Nodular sclerosis classical Hodgkin lymphoma, lymph nodes of multiple sites: Secondary | ICD-10-CM

## 2018-09-06 DIAGNOSIS — C8110 Nodular sclerosis classical Hodgkin lymphoma, unspecified site: Secondary | ICD-10-CM

## 2018-09-06 MED ORDER — SODIUM CHLORIDE 0.9 % IV SOLN
Freq: Once | INTRAVENOUS | Status: AC
  Administered 2018-09-06: 15:00:00 via INTRAVENOUS
  Filled 2018-09-06: qty 5

## 2018-09-06 MED ORDER — SODIUM CHLORIDE 0.9% FLUSH
10.0000 mL | INTRAVENOUS | Status: DC | PRN
  Administered 2018-09-06: 10 mL
  Filled 2018-09-06: qty 10

## 2018-09-06 MED ORDER — SODIUM CHLORIDE 0.9 % IV SOLN
200.0000 mg/m2 | Freq: Once | INTRAVENOUS | Status: AC
  Administered 2018-09-06: 420 mg via INTRAVENOUS
  Filled 2018-09-06: qty 21

## 2018-09-06 MED ORDER — SODIUM CHLORIDE 0.9 % IV SOLN
Freq: Once | INTRAVENOUS | Status: AC
  Administered 2018-09-06: 15:00:00 via INTRAVENOUS
  Filled 2018-09-06: qty 250

## 2018-09-06 MED ORDER — HEPARIN SOD (PORK) LOCK FLUSH 100 UNIT/ML IV SOLN
500.0000 [IU] | Freq: Once | INTRAVENOUS | Status: AC | PRN
  Administered 2018-09-06: 500 [IU]
  Filled 2018-09-06: qty 5

## 2018-09-06 NOTE — Patient Instructions (Signed)
[facility] Discharge Instructions for Patients Receiving Chemotherapy  Today you received the following chemotherapy agents Etoposide (VEPESIDE).  To help prevent nausea and vomiting after your treatment, we encourage you to take your nausea medication as prescribed.   If you develop nausea and vomiting that is not controlled by your nausea medication, call the clinic.   BELOW ARE SYMPTOMS THAT SHOULD BE REPORTED IMMEDIATELY:  *FEVER GREATER THAN 100.5 F  *CHILLS WITH OR WITHOUT FEVER  NAUSEA AND VOMITING THAT IS NOT CONTROLLED WITH YOUR NAUSEA MEDICATION  *UNUSUAL SHORTNESS OF BREATH  *UNUSUAL BRUISING OR BLEEDING  TENDERNESS IN MOUTH AND THROAT WITH OR WITHOUT PRESENCE OF ULCERS  *URINARY PROBLEMS  *BOWEL PROBLEMS  UNUSUAL RASH Items with * indicate a potential emergency and should be followed up as soon as possible.  Feel free to call the clinic should you have any questions or concerns. The clinic phone number is [phone].  Please show the [facility] at check-in to the Emergency Department and triage nurse.

## 2018-09-10 NOTE — Progress Notes (Signed)
HEMATOLOGY/ONCOLOGY CLINIC NOTE  Date of Service: 09/11/18     Patient Care Team: System, Pcp Not In as PCP - General  CHIEF COMPLAINTS/PURPOSE OF CONSULTATION:  Nodular sclerosis Hodgkin's Lymphoma   HISTORY OF PRESENTING ILLNESS:   David Lam is a wonderful 23 y.o. male who has been referred to [place] by Dr. 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 He is accompanied today by his mother and sister. The pt reports that he is doing well overall.   The pt presented to the ED on 05/07/18 after developing chest pain over 2 days, with positional elements. The pt was evaluated with a CXR which revealed a mediastinal mass that was biopsied on 05/10/18 and confirmed nodular sclerosis Hodgkin's lymphoma, as noted below.   The pt notes that he though his chest discomfort was due to working out, and denies noticing any swelling in the area. The pt denies any recent fevers, chills, night sweats, skin rashes, unexpected weight loss. The pt notes that his chest pain continues and is making his sleeping difficult. He denies any difficulty ambulating or SOB.   The pt reports that he has not had any prior medical problems, and has had left thumb and left ankle surgeries from injuries. 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   Of note prior to the patient's visit today, pt has had a [place] lymph node biopsy completed on 05/10/18 with results revealing Nodular sclerosis Hodgkin's Lymphoma    The pt also had a CTA Chest on 05/07/18 which revealed Bulky, bilateral anterior superior mediastinal confluent soft tissue masses which are not calcified in appearance and which do not appear to cause occlusion or significant luminal narrowing of traversing vasculature. Leading consideration is lymphoma, possibly Hodgkin's. Given lack of differentiating soft tissue densities, a teratoma is believed less likely. Nonseminomatous germ cell tumor given presence of  small effusions might also be within differential though no pulmonary lesions are visualized. Metastatic disease, infection or inflammatory process are believed less likely.  The pt also had a CT A/P on 05/08/18 which revealed No acute finding or evidence of malignancy in the abdomen.  Most recent lab results (05/09/18) of CBC is as follows: all values are WNL except for WBC at 10.6k, HGB at 11.9, HCT at 34.7, MCV at 78.5, ANC at 7.9k, Eosinophils abs at 600. 05/08/18 Sed Rate at 19  On review of systems, pt reports chest pain, moving his bowels well, and denies skin rashes, fevers, chills, night sweats, unexpected weight loss, SOB, headaches, changes in vision, urinary pain or discomfort, abdominal pains, testicular pain or swelling, leg swelling, arm swelling, and any other symptoms.   On PMHx the pt reports left thumb and left ankle surgeries, denies blood transfusions. On Social Hx the pt denies alcohol consumption and denies smoking cigarettes or other recreational drugs. 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 Denies tattoos.  On Family Hx the pt denies any blood disorders or cancers.  He denies any medications.  Interval History:   David Lam returns today for management, evaluation and after C1 Escalated BEACOPP treatment of his Classical Hodgkin's Lymphoma. The patient's last visit with [place] was on 09/04/18. He is accompanied today by his mother. The pt reports that he is doing well overall.   The pt reports that his chest pain has improved in the interim. He has continued on Xarelto. The pt notes that he has not had SOB in the interim as well.  He denies problems taking Procarbazine at home. He denies constipation but has noticed some blood on the toilet paper. He denies having a hemorrhoid in the past. He notes that his bleeding is improved, and denies pain while moving his bowels. He endorses stable energy levels and has been eating well.   Lab results today (09/11/18) of  CBC w/diff is as follows: all values are WNL except for WBC at 3.8k, RBC at 4.21, HGB at 11.2, HCT at 32.9, MCV at 78.1, RDW at 15.9, Lymphs abs at 500, Monocytes abs at 0.0k, Abs immature granulocytes at 0.16k. 09/11/18 CMP is pending  On review of systems, pt reports moving his bowels well, stable energy levels, improved CP, eating well, and denies constipation, SOB, pain along the spine, abdominal pains, leg swelling, and any other symptoms.    MEDICAL HISTORY:  Past Medical History:  Diagnosis Date  . Non Hodgkin's lymphoma ([place])   . Non Hodgkin's lymphoma ([place])     SURGICAL HISTORY: Past Surgical History:  Procedure Laterality Date  . ANKLE SURGERY Left    cleaned up   . FRACTURE SURGERY Left    arm  . IR IMAGING GUIDED PORT INSERTION  06/03/2018  . MEDIASTINOTOMY CHAMBERLAIN MCNEIL N/A 05/10/2018   Procedure: Mediastinal mass biopsy ;  Surgeon: Grace Isaac, MD;  Location: [facility] OR;  Service: Thoracic;  Laterality: N/A;  . ORIF FINGER / THUMB FRACTURE Left     SOCIAL HISTORY: Social History   Socioeconomic History  . Marital status: Unknown    Spouse name: Not on file  . Number of children: Not on file  . Years of education: Not on file  . Highest education level: Not on file  Occupational History  . Not on file  Social Needs  . Financial resource strain: Not on file  . Food insecurity:    Worry: Not on file    Inability: Not on file  . Transportation needs:    Medical: Not on file    Non-medical: Not on file  Tobacco Use  . Smoking status: Never Smoker  . Smokeless tobacco: Never Used  Substance and Sexual Activity  . Alcohol use: Never    Frequency: Never  . Drug use: Never  . Sexual activity: Not on file  Lifestyle  . Physical activity:    Days per week: Not on file    Minutes per session: Not on file  . Stress: Not on file  Relationships  . Social connections:    Talks on phone: Not on file    Gets together: Not on file    Attends religious  service: Not on file    Active member of club or organization: Not on file    Attends meetings of clubs or organizations: Not on file    Relationship status: Not on file  . Intimate partner violence:    Fear of current or ex partner: Not on file    Emotionally abused: Not on file    Physically abused: Not on file    Forced sexual activity: Not on file  Other Topics Concern  . Not on file  Social History Narrative  . Not on file    FAMILY HISTORY: No family history on file.  ALLERGIES:  has No Known Allergies.  MEDICATIONS:  Current Outpatient Medications  Medication Sig Dispense Refill  . cephALEXin (KEFLEX) 500 MG capsule Take 1 capsule (500 mg total) by mouth 3 (three) times daily. (Patient not taking: Reported on 09/04/2018) 21 capsule  0  . diphenhydrAMINE (BENADRYL) 25 mg capsule Take 25 mg by mouth as needed.    . lidocaine-prilocaine (EMLA) cream Apply 1 application topically as needed. 30 g 0  . LORazepam (ATIVAN) 0.5 MG tablet Take 1 tablet (0.5 mg total) by mouth every 6 (six) hours as needed (Nausea or vomiting). 30 tablet 0  . ondansetron (ZOFRAN) 8 MG tablet Take 1 tablet (8 mg total) by mouth 2 (two) times daily as needed. Start on the third day after doxorubicin/cytoxan chemotherapy. 30 tablet 1  . predniSONE (DELTASONE) 20 MG tablet Take 3 tablets (60 mg total) by mouth daily with breakfast. Take daily on days 1-14 of chemotherapy. 50 tablet 1  . procarbazine (MATULANE) 50 MG capsule Take 4 capsules (200 mg total) by mouth daily. Take daily on days 1-7 of each 21 day cycle. Follow low tyramine diet. 28 capsule 2  . prochlorperazine (COMPAZINE) 10 MG tablet Take 1 tablet (10 mg total) by mouth every 6 (six) hours as needed for nausea or vomiting. 30 tablet 1  . Rivaroxaban 15 & 20 MG TBPK Take as directed on package: Start with one 15mg  tablet by mouth twice a day with food. On Day 22, switch to one 20mg  tablet once a day with food. 51 each 0   No current  facility-administered medications for this visit.     REVIEW OF SYSTEMS:    A 10+ POINT REVIEW OF SYSTEMS WAS OBTAINED including neurology, dermatology, psychiatry, cardiac, respiratory, lymph, extremities, GI, GU, Musculoskeletal, constitutional, breasts, reproductive, HEENT.  All pertinent positives are noted in the HPI.  All others are negative.   PHYSICAL EXAMINATION: ECOG PERFORMANCE STATUS: 1 - Symptomatic but completely ambulatory  . Vitals:   09/11/18 1245  BP: (!) 141/86  Pulse: 100  Resp: 17  Temp: 98.7 F (37.1 C)  SpO2: 100%   Filed Weights   09/11/18 1245  Weight: 206 lb (93.4 kg)   .Body mass index is 31.32 kg/m.  GENERAL:alert, in no acute distress and comfortable SKIN: no acute rashes, no significant lesions EYES: conjunctiva are pink and non-injected, sclera anicteric OROPHARYNX: MMM, no exudates, no oropharyngeal erythema or ulceration NECK: supple, no JVD LYMPH:  no palpable lymphadenopathy in the cervical, axillary or inguinal regions LUNGS: clear to auscultation b/l with normal respiratory effort HEART: regular rate & rhythm ABDOMEN:  normoactive bowel sounds , non tender, not distended. No palpable hepatosplenomegaly.  Extremity: no pedal edema PSYCH: alert & oriented x 3 with fluent speech NEURO: no focal motor/sensory deficits   LABORATORY DATA:  I have reviewed the data as listed  . CBC Latest Ref Rng & Units 09/11/2018 09/04/2018 09/03/2018  WBC 4.0 - 10.5 K/uL 3.8(L) 13.9(H) 13.5(H)  Hemoglobin 13.0 - 17.0 g/dL 11.2(L) 13.3 13.6  Hematocrit 39.0 - 52.0 % 32.9(L) 39.6 40.2  Platelets 150 - 400 K/uL 213 337 334    . CMP Latest Ref Rng & Units 09/04/2018 09/03/2018 08/30/2018  Glucose 70 - 99 mg/dL 97 106(H) 93  BUN 6 - 20 mg/dL 13 13 18   Creatinine 0.61 - 1.24 mg/dL 0.95 1.02 1.05  Sodium 135 - 145 mmol/L 137 141 137  Potassium 3.5 - 5.1 mmol/L 4.0 3.8 4.1  Chloride 98 - 111 mmol/L 105 106 105  CO2 22 - 32 mmol/L 25 25 25   Calcium 8.9 -  10.3 mg/dL 9.3 9.5 9.3  Total Protein 6.5 - 8.1 g/dL - 7.9 7.8  Total Bilirubin 0.3 - 1.2 mg/dL - 0.5 0.6  Alkaline Phos  38 - 126 U/L - 90 88  AST 15 - 41 U/L - 17 15  ALT 0 - 44 U/L - 20 21   05/10/18 Biopsy:   05/22/18 BM Bx:    RADIOGRAPHIC STUDIES: I have personally reviewed the radiological images as listed and agreed with the findings in the report. Dg Chest 2 View  Result Date: 09/04/2018 CLINICAL DATA:  Chest pain, history of Hodgkin's lymphoma EXAM: CHEST - 2 VIEW COMPARISON:  08/16/2018 PET-CT FINDINGS: Cardiac shadow is within normal limits. Increased soft tissue density is along the right hilum and right paratracheal region as well as the superior mediastinum on the left consistent with the previously seen lymph nodes. Lungs are clear. No bony abnormality is noted. Right chest wall port is seen. IMPRESSION: Changes consistent with mediastinal adenopathy similar to that seen on prior PET-CT. No acute abnormality is noted. Electronically Signed   By: Inez Catalina M.D.   On: 09/04/2018 02:32   Ct Angio Chest Pe W And/or Wo Contrast  Result Date: 09/04/2018 CLINICAL DATA:  Chest pain. EXAM: CT ANGIOGRAPHY CHEST WITH CONTRAST TECHNIQUE: Multidetector CT imaging of the chest was performed using the standard protocol during bolus administration of intravenous contrast. Multiplanar CT image reconstructions and MIPs were obtained to evaluate the vascular anatomy. CONTRAST:  69mL ISOVUE-370 IOPAMIDOL (ISOVUE-370) INJECTION 76% COMPARISON:  08/16/2018 PET-CT FINDINGS: Cardiovascular: 2 branching central pulmonary filling defects seen in subsegmental right upper lobe branches. No additional acute pulmonary embolism is seen. Normal heart size. No pericardial effusion. Porta catheter on the right with tip at the upper right atrium. 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 Lungs/Pleura: Generalized airway thickening and mosaic lung attenuation. This is likely  accentuated by expiratory imaging. Similar findings were seen on 2020 PET-CT, although better inflated at that time. Even though there is acute pulmonary embolism this is most likely due to small airways disease given the extent greater than visible PE and the airway thickening. Upper Abdomen: Negative Musculoskeletal: Negative Critical Value/emergent results were called by telephone at the time of interpretation on 09/04/2018 at 6:19 am to Dr. Addison Lank , who verbally acknowledged these results. Review of the MIP images confirms the above findings. IMPRESSION: 1. Positive for 2 acute subsegmental pulmonary emboli in the right upper lobe. 2. Heterogeneous aeration diffusely, likely small airways disease with air trapping given there is also generalized bronchial wall thickening. 3. 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 Electronically Signed   By: Monte Fantasia M.D.   On: 09/04/2018 06:20   Nm Pet Image Restag (ps) Skull Base To Thigh  Result Date: 08/16/2018 CLINICAL DATA:  Subsequent treatment strategy for restaging of lymphoma. EXAM: NUCLEAR MEDICINE PET SKULL BASE TO THIGH TECHNIQUE: 10.9 mCi F-18 FDG was injected intravenously. Full-ring PET imaging was performed from the skull base to thigh after the radiotracer. CT data was obtained and used for attenuation correction and anatomic localization. Fasting blood glucose: 92 mg/dl COMPARISON:  PET of 05/21/2018. FINDINGS: Mediastinal blood pool activity: SUV max 2.1 NECK: Significant, marked hypermetabolic brown fat throughout the neck and chest. Given this limitation, no hypermetabolic nodes within the neck. Incidental CT findings: No gross cervical adenopathy. Paucity of fat degrades evaluation. CHEST: Redemonstration of extensive hypermetabolic thoracic adenopathy. Index right paratracheal node measures 1.6 cm and a S.U.V. max of 10.3 today versus similar in size and a S.U.V. max of 7.7 on the prior. A prevascular nodal mass measures  on the order of 3.7 by 4.7  cm and a S.U.V. max of 6.3 today. Compare 3.6 x 4.2 cm and a S.U.V. max of 4.4 on the prior. Right cardiophrenic angle mass measures on the order of 7.3 x 4.2 cm and a S.U.V. max of 14.1 today. Compare 6.3 x 4.1 cm and a S.U.V. max of 6.8 on the prior. Incidental CT findings: Right Port-A-Cath with tip at mid right atrium. ABDOMEN/PELVIS: No abdominopelvic parenchymal or nodal hypermetabolism. Incidental CT findings: Normal adrenal glands. No splenomegaly. Trace cul-de-sac fluid on image 177/4. SKELETON: No abnormal marrow activity. Incidental CT findings: none IMPRESSION: 1. Since the prior PET of 05/21/2018, mild disease progression, as evidenced by increased activity and less so size of hypermetabolic thoracic nodes. (Deauville 4) 2. No new or extrathoracic sites of disease. 3. Significant hypermetabolic "brown" fat throughout the neck and chest, mildly degrading evaluation. Electronically Signed   By: Abigail Miyamoto M.D.   On: 08/16/2018 16:16    ASSESSMENT & PLAN:   23 y.o. male with  1. 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 Leading consideration is lymphoma, possibly Hodgkin's. Given lack of differentiating soft tissue densities, a teratoma is believed less likely. Nonseminomatous germ cell tumor given presence of small effusions might also be within differential though no pulmonary lesions are visualized. Metastatic disease, infection or inflammatory process are believed less likely.    05/08/18 CT A/P revealed No acute finding or evidence of malignancy in the abdomen.  05/08/18 ECHO revealed LV EF of 55-60%   05/10/18 Scal[place] lymph node Biopsy revealed Nodular sclerosis Hodgkin's Lymphoma   05/08/18  ECHO revealed LV EF of 55-60%  05/16/18 Hep B, Hep C, and HIV labs all negative   05/21/18 PET/CT revealed Hypermetabolic mediastinal adenopathy is identified compatible with history of lymphoma. No hypermetabolic lymph nodes within the neck, abdomen or pelvis.  05/22/18 BM Bx was negative   S/p 3 cycles of ABVD  08/16/18 PET/CT revealed Since the prior PET of 05/21/2018, mild disease progression, as evidenced by increased activity and less so size of hypermetabolic thoracic nodes. (Deauville 4) 2. No new or extrathoracic sites of disease. 3. Significant hypermetabolic "brown" fat throughout the neck and chest, mildly degrading evaluation. 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 B/L great toe subungal hematomas - likely from chemotherapy -podiatry referral given -Patient saw Podiatry, and toe nail was removed -Continue Epsom salt soaks   3. Pulmonary Embolism 09/04/18 CTA Chest revealed Positive for 2 acute subsegmental pulmonary emboli in the right upper lobe. 2. Heterogeneous aeration diffusely, likely small airways disease with air trapping given there is also generalized bronchial wall thickening. 3. 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 Tumor as risk factor for PE  PLAN: -Discussed pt labwork today, 09/11/18; blood counts are holding -Continue with stool softeners -The pt has no prohibitive toxicities from continuing C1 Escalated BEACOPP at this time.   -Neulasta support on 09/13/18  -Continue 20mg  Xarelto -Will set pt up for US Venous BLE -Pt will avoid NSAIDs. Tylenol as needed. -Will complete 2 cycles of escalated BEACOPP, then will repeat PET/CT to gauge response and determine additional cycle(s) vs beginning RT -Continue with infection prevention strategies -Continue Vitamin B complex -Recommend salt and baking soda mouthwashes to prevent mouth sores  [place] bilateral lower extremities venous on  09/13/2018 Labs in 1 week F/u for C2 of treatment as scheduled on 09/25/2018   All of the patients questions were answered with apparent satisfaction. The patient knows to call the clinic with any problems, questions or concerns.  The total time spent in the appt was 25 minutes and more than 50% was on counseling and direct patient cares.    Sullivan Lone MD MS AAHIVMS [facility] Hematology/Oncology Physician [facility]  (Office):       [phone] (Work cell):  [phone] (Fax):           [phone]  09/11/2018 1:06 PM   I, Baldwin [place], am acting as a scribe for Dr. Sullivan Lone.   .I have reviewed the above documentation for accuracy and completeness, and I agree with the above. [name] MD

## 2018-09-11 ENCOUNTER — Telehealth: Payer: Self-pay | Admitting: Hematology

## 2018-09-11 ENCOUNTER — Inpatient Hospital Stay: Payer: BLUE CROSS/BLUE SHIELD

## 2018-09-11 ENCOUNTER — Ambulatory Visit: Payer: BLUE CROSS/BLUE SHIELD | Admitting: Hematology

## 2018-09-11 ENCOUNTER — Other Ambulatory Visit: Payer: BLUE CROSS/BLUE SHIELD

## 2018-09-11 ENCOUNTER — Ambulatory Visit: Payer: BLUE CROSS/BLUE SHIELD

## 2018-09-11 ENCOUNTER — Inpatient Hospital Stay (HOSPITAL_BASED_OUTPATIENT_CLINIC_OR_DEPARTMENT_OTHER): Payer: BLUE CROSS/BLUE SHIELD | Admitting: Hematology

## 2018-09-11 VITALS — BP 141/86 | HR 100 | Temp 98.7°F | Resp 17 | Ht 68.0 in | Wt 206.0 lb

## 2018-09-11 DIAGNOSIS — C8118 Nodular sclerosis classical Hodgkin lymphoma, lymph nodes of multiple sites: Secondary | ICD-10-CM

## 2018-09-11 DIAGNOSIS — I2699 Other pulmonary embolism without acute cor pulmonale: Secondary | ICD-10-CM | POA: Diagnosis not present

## 2018-09-11 DIAGNOSIS — Z95828 Presence of other vascular implants and grafts: Secondary | ICD-10-CM

## 2018-09-11 DIAGNOSIS — C8110 Nodular sclerosis classical Hodgkin lymphoma, unspecified site: Secondary | ICD-10-CM

## 2018-09-11 LAB — CMP (CANCER CENTER ONLY)
ALT: 24 U/L (ref 0–44)
AST: 10 U/L — ABNORMAL LOW (ref 15–41)
Albumin: 3.8 g/dL (ref 3.5–5.0)
Alkaline Phosphatase: 65 U/L (ref 38–126)
Anion gap: 7 (ref 5–15)
BUN: 13 mg/dL (ref 6–20)
CO2: 30 mmol/L (ref 22–32)
Calcium: 8.7 mg/dL — ABNORMAL LOW (ref 8.9–10.3)
Chloride: 105 mmol/L (ref 98–111)
Creatinine: 0.91 mg/dL (ref 0.61–1.24)
GFR, Est AFR Am: >60 mL/min (ref >60)
GFR, Estimated: >60 mL/min (ref >60)
Glucose, Bld: 96 mg/dL (ref 70–99)
Potassium: 3.4 mmol/L — ABNORMAL LOW (ref 3.5–5.1)
SODIUM: 142 mmol/L (ref 135–145)
Total Bilirubin: 0.5 mg/dL (ref 0.3–1.2)
Total Protein: 6.8 g/dL (ref 6.5–8.1)

## 2018-09-11 LAB — CBC WITH DIFFERENTIAL/PLATELET
Abs Immature Granulocytes: 0.16 10*3/uL — ABNORMAL HIGH (ref 0.00–0.07)
Basophils Absolute: 0 10*3/uL (ref 0.0–0.1)
Basophils Relative: 1 %
Eosinophils Absolute: 0 10*3/uL (ref 0.0–0.5)
Eosinophils Relative: 1 %
HCT: 32.9 % — ABNORMAL LOW (ref 39.0–52.0)
HEMOGLOBIN: 11.2 g/dL — AB (ref 13.0–17.0)
Immature Granulocytes: 4 %
Lymphocytes Relative: 12 %
Lymphs Abs: 0.5 10*3/uL — ABNORMAL LOW (ref 0.7–4.0)
MCH: 26.6 pg (ref 26.0–34.0)
MCHC: 34 g/dL (ref 30.0–36.0)
MCV: 78.1 fL — ABNORMAL LOW (ref 80.0–100.0)
Monocytes Absolute: 0 10*3/uL — ABNORMAL LOW (ref 0.1–1.0)
Monocytes Relative: 1 %
NRBC: 0 % (ref 0.0–0.2)
Neutro Abs: 3.1 10*3/uL (ref 1.7–7.7)
Neutrophils Relative %: 81 %
Platelets: 213 10*3/uL (ref 150–400)
RBC: 4.21 MIL/uL — AB (ref 4.22–5.81)
RDW: 15.9 % — ABNORMAL HIGH (ref 11.5–15.5)
WBC: 3.8 10*3/uL — ABNORMAL LOW (ref 4.0–10.5)

## 2018-09-11 MED ORDER — HEPARIN SOD (PORK) LOCK FLUSH 100 UNIT/ML IV SOLN
500.0000 [IU] | Freq: Once | INTRAVENOUS | Status: AC | PRN
  Administered 2018-09-11: 500 [IU]
  Filled 2018-09-11: qty 5

## 2018-09-11 MED ORDER — SODIUM CHLORIDE 0.9% FLUSH
10.0000 mL | INTRAVENOUS | Status: DC | PRN
  Administered 2018-09-11: 10 mL
  Filled 2018-09-11: qty 10

## 2018-09-11 MED ORDER — PROCHLORPERAZINE MALEATE 10 MG PO TABS
10.0000 mg | ORAL_TABLET | Freq: Once | ORAL | Status: AC
  Administered 2018-09-11: 10 mg via ORAL

## 2018-09-11 MED ORDER — VINCRISTINE SULFATE CHEMO INJECTION 1 MG/ML
2.0000 mg | Freq: Once | INTRAVENOUS | Status: AC
  Administered 2018-09-11: 2 mg via INTRAVENOUS
  Filled 2018-09-11: qty 2

## 2018-09-11 MED ORDER — SODIUM CHLORIDE 0.9 % IV SOLN
9.5000 [IU]/m2 | Freq: Once | INTRAVENOUS | Status: AC
  Administered 2018-09-11: 20 [IU] via INTRAVENOUS
  Filled 2018-09-11: qty 6.67

## 2018-09-11 MED ORDER — PROCHLORPERAZINE MALEATE 10 MG PO TABS
ORAL_TABLET | ORAL | Status: AC
  Filled 2018-09-11: qty 1

## 2018-09-11 NOTE — Telephone Encounter (Signed)
Called and scheduled appt per 2/19 los.  Patient aware of apprt date and time.

## 2018-09-11 NOTE — Patient Instructions (Signed)
[place] Discharge Instructions for Patients Receiving Chemotherapy  Today you received the following chemotherapy agents:  Doxorubicin, Venblastine, Bleomycin, Dacarbezine  To help prevent nausea and vomiting after your treatment, we encourage you to take your nausea medication as prescribed.   If you develop nausea and vomiting that is not controlled by your nausea medication, call the clinic.   BELOW ARE SYMPTOMS THAT SHOULD BE REPORTED IMMEDIATELY:  *FEVER GREATER THAN 100.5 F  *CHILLS WITH OR WITHOUT FEVER  NAUSEA AND VOMITING THAT IS NOT CONTROLLED WITH YOUR NAUSEA MEDICATION  *UNUSUAL SHORTNESS OF BREATH  *UNUSUAL BRUISING OR BLEEDING  TENDERNESS IN MOUTH AND THROAT WITH OR WITHOUT PRESENCE OF ULCERS  *URINARY PROBLEMS  *BOWEL PROBLEMS  UNUSUAL RASH Items with * indicate a potential emergency and should be followed up as soon as possible.  Feel free to call the clinic should you have any questions or concerns. The clinic phone number is [phone].  Please show the [place] at check-in to the Emergency Department and triage nurse.

## 2018-09-12 ENCOUNTER — Ambulatory Visit (HOSPITAL_COMMUNITY)
Admission: RE | Admit: 2018-09-12 | Discharge: 2018-09-12 | Disposition: A | Discharge status: Home / Self Care | Payer: BLUE CROSS/BLUE SHIELD | Source: Ambulatory Visit | Attending: Hematology | Admitting: Hematology

## 2018-09-12 DIAGNOSIS — I2699 Other pulmonary embolism without acute cor pulmonale: Secondary | ICD-10-CM | POA: Diagnosis present

## 2018-09-12 NOTE — Progress Notes (Signed)
Lower extremity venous has been completed.   Preliminary results in CV Proc.   Abram Sander 09/12/2018 11:44 AM

## 2018-09-13 ENCOUNTER — Ambulatory Visit: Payer: BLUE CROSS/BLUE SHIELD

## 2018-09-13 ENCOUNTER — Ambulatory Visit: Payer: BLUE CROSS/BLUE SHIELD | Admitting: Hematology

## 2018-09-13 ENCOUNTER — Inpatient Hospital Stay: Payer: BLUE CROSS/BLUE SHIELD

## 2018-09-13 ENCOUNTER — Other Ambulatory Visit: Payer: BLUE CROSS/BLUE SHIELD

## 2018-09-13 DIAGNOSIS — C8110 Nodular sclerosis classical Hodgkin lymphoma, unspecified site: Secondary | ICD-10-CM

## 2018-09-13 DIAGNOSIS — Z5189 Encounter for other specified aftercare: Secondary | ICD-10-CM | POA: Diagnosis not present

## 2018-09-13 DIAGNOSIS — I2693 Single subsegmental pulmonary embolism without acute cor pulmonale: Secondary | ICD-10-CM | POA: Diagnosis not present

## 2018-09-13 DIAGNOSIS — C8118 Nodular sclerosis classical Hodgkin lymphoma, lymph nodes of multiple sites: Secondary | ICD-10-CM

## 2018-09-13 DIAGNOSIS — R0789 Other chest pain: Secondary | ICD-10-CM | POA: Diagnosis not present

## 2018-09-13 DIAGNOSIS — Z5111 Encounter for antineoplastic chemotherapy: Secondary | ICD-10-CM | POA: Diagnosis not present

## 2018-09-13 DIAGNOSIS — I2699 Other pulmonary embolism without acute cor pulmonale: Secondary | ICD-10-CM | POA: Diagnosis not present

## 2018-09-13 MED ORDER — PEGFILGRASTIM-CBQV 6 MG/0.6ML ~~LOC~~ SOSY
PREFILLED_SYRINGE | SUBCUTANEOUS | Status: AC
  Filled 2018-09-13: qty 0.6

## 2018-09-13 MED ORDER — PEGFILGRASTIM-CBQV 6 MG/0.6ML ~~LOC~~ SOSY
6.0000 mg | PREFILLED_SYRINGE | Freq: Once | SUBCUTANEOUS | Status: AC
  Administered 2018-09-13: 6 mg via SUBCUTANEOUS

## 2018-09-13 NOTE — Patient Instructions (Signed)
Pegfilgrastim injection  What is this medicine?  PEGFILGRASTIM (PEG fil gra stim) is a long-acting granulocyte colony-stimulating factor that stimulates the growth of neutrophils, a type of white blood cell important in the body's fight against infection. It is used to reduce the incidence of fever and infection in patients with certain types of cancer who are receiving chemotherapy that affects the bone marrow, and to increase survival after being exposed to high doses of radiation.  This medicine may be used for other purposes; ask your health care provider or pharmacist if you have questions.  COMMON BRAND NAME(S): Fulphila, Neulasta, UDENYCA  What should I tell my health care provider before I take this medicine?  They need to know if you have any of these conditions:  -kidney disease  -latex allergy  -ongoing radiation therapy  -sickle cell disease  -skin reactions to acrylic adhesives (On-Body Injector only)  -an unusual or allergic reaction to pegfilgrastim, filgrastim, other medicines, foods, dyes, or preservatives  -pregnant or trying to get pregnant  -breast-feeding  How should I use this medicine?  This medicine is for injection under the skin. If you get this medicine at home, you will be taught how to prepare and give the pre-filled syringe or how to use the On-body Injector. Refer to the patient Instructions for Use for detailed instructions. Use exactly as directed. Tell your healthcare provider immediately if you suspect that the On-body Injector may not have performed as intended or if you suspect the use of the On-body Injector resulted in a missed or partial dose.  It is important that you put your used needles and syringes in a special sharps container. Do not put them in a trash can. If you do not have a sharps container, call your pharmacist or healthcare provider to get one.  Talk to your pediatrician regarding the use of this medicine in children. While this drug may be prescribed for  selected conditions, precautions do apply.  Overdosage: If you think you have taken too much of this medicine contact a poison control center or emergency room at once.  NOTE: This medicine is only for you. Do not share this medicine with others.  What if I miss a dose?  It is important not to miss your dose. Call your doctor or health care professional if you miss your dose. If you miss a dose due to an On-body Injector failure or leakage, a new dose should be administered as soon as possible using a single prefilled syringe for manual use.  What may interact with this medicine?  Interactions have not been studied.  Give your health care provider a list of all the medicines, herbs, non-prescription drugs, or dietary supplements you use. Also tell them if you smoke, drink alcohol, or use illegal drugs. Some items may interact with your medicine.  This list may not describe all possible interactions. Give your health care provider a list of all the medicines, herbs, non-prescription drugs, or dietary supplements you use. Also tell them if you smoke, drink alcohol, or use illegal drugs. Some items may interact with your medicine.  What should I watch for while using this medicine?  You may need blood work done while you are taking this medicine.  If you are going to need a MRI, CT scan, or other procedure, tell your doctor that you are using this medicine (On-Body Injector only).  What side effects may I notice from receiving this medicine?  Side effects that you should report to   your doctor or health care professional as soon as possible:  -allergic reactions like skin rash, itching or hives, swelling of the face, lips, or tongue  -back pain  -dizziness  -fever  -pain, redness, or irritation at site where injected  -pinpoint red spots on the skin  -red or dark-brown urine  -shortness of breath or breathing problems  -stomach or side pain, or pain at the shoulder  -swelling  -tiredness  -trouble passing urine or  change in the amount of urine  Side effects that usually do not require medical attention (report to your doctor or health care professional if they continue or are bothersome):  -bone pain  -muscle pain  This list may not describe all possible side effects. Call your doctor for medical advice about side effects. You may report side effects to FDA at 1-800-FDA-1088.  Where should I keep my medicine?  Keep out of the reach of children.  If you are using this medicine at home, you will be instructed on how to store it. Throw away any unused medicine after the expiration date on the label.  NOTE: This sheet is a summary. It may not cover all possible information. If you have questions about this medicine, talk to your doctor, pharmacist, or health care provider.   2019 Elsevier/Gold Standard (2017-10-15 16:57:08)

## 2018-09-15 ENCOUNTER — Other Ambulatory Visit: Payer: Self-pay

## 2018-09-15 ENCOUNTER — Inpatient Hospital Stay (HOSPITAL_COMMUNITY)
Admission: EM | Admit: 2018-09-15 | Discharge: 2018-09-20 | DRG: 871 | Disposition: A | Discharge status: Home / Self Care | Payer: BLUE CROSS/BLUE SHIELD | Attending: Internal Medicine | Admitting: Internal Medicine

## 2018-09-15 ENCOUNTER — Encounter (HOSPITAL_COMMUNITY): Payer: Self-pay

## 2018-09-15 DIAGNOSIS — M549 Dorsalgia, unspecified: Secondary | ICD-10-CM | POA: Diagnosis present

## 2018-09-15 DIAGNOSIS — Z23 Encounter for immunization: Secondary | ICD-10-CM | POA: Diagnosis not present

## 2018-09-15 DIAGNOSIS — M545 Low back pain, unspecified: Secondary | ICD-10-CM

## 2018-09-15 DIAGNOSIS — E876 Hypokalemia: Secondary | ICD-10-CM | POA: Diagnosis not present

## 2018-09-15 DIAGNOSIS — R109 Unspecified abdominal pain: Secondary | ICD-10-CM

## 2018-09-15 DIAGNOSIS — D709 Neutropenia, unspecified: Secondary | ICD-10-CM | POA: Diagnosis not present

## 2018-09-15 DIAGNOSIS — M4807 Spinal stenosis, lumbosacral region: Secondary | ICD-10-CM | POA: Diagnosis present

## 2018-09-15 DIAGNOSIS — Z7901 Long term (current) use of anticoagulants: Secondary | ICD-10-CM

## 2018-09-15 DIAGNOSIS — A419 Sepsis, unspecified organism: Secondary | ICD-10-CM | POA: Diagnosis not present

## 2018-09-15 DIAGNOSIS — Z95828 Presence of other vascular implants and grafts: Secondary | ICD-10-CM

## 2018-09-15 DIAGNOSIS — D630 Anemia in neoplastic disease: Secondary | ICD-10-CM | POA: Diagnosis present

## 2018-09-15 DIAGNOSIS — C8190 Hodgkin lymphoma, unspecified, unspecified site: Secondary | ICD-10-CM | POA: Diagnosis present

## 2018-09-15 DIAGNOSIS — R5081 Fever presenting with conditions classified elsewhere: Secondary | ICD-10-CM | POA: Diagnosis present

## 2018-09-15 DIAGNOSIS — R531 Weakness: Secondary | ICD-10-CM | POA: Diagnosis present

## 2018-09-15 DIAGNOSIS — M5127 Other intervertebral disc displacement, lumbosacral region: Secondary | ICD-10-CM | POA: Diagnosis present

## 2018-09-15 DIAGNOSIS — D6959 Other secondary thrombocytopenia: Secondary | ICD-10-CM | POA: Diagnosis present

## 2018-09-15 DIAGNOSIS — Z79899 Other long term (current) drug therapy: Secondary | ICD-10-CM | POA: Diagnosis not present

## 2018-09-15 DIAGNOSIS — I2699 Other pulmonary embolism without acute cor pulmonale: Secondary | ICD-10-CM | POA: Diagnosis not present

## 2018-09-15 LAB — COMPREHENSIVE METABOLIC PANEL
ALT: 48 U/L — ABNORMAL HIGH (ref 0–44)
AST: 22 U/L (ref 15–41)
Albumin: 4.5 g/dL (ref 3.5–5.0)
Alkaline Phosphatase: 71 U/L (ref 38–126)
Anion gap: 12 (ref 5–15)
BUN: 14 mg/dL (ref 6–20)
CO2: 24 mmol/L (ref 22–32)
CREATININE: 0.83 mg/dL (ref 0.61–1.24)
Calcium: 9.6 mg/dL (ref 8.9–10.3)
Chloride: 99 mmol/L (ref 98–111)
GFR calc Af Amer: >60 mL/min (ref >60)
GFR calc non Af Amer: >60 mL/min (ref >60)
Glucose, Bld: 143 mg/dL — ABNORMAL HIGH (ref 70–99)
Potassium: 3.7 mmol/L (ref 3.5–5.1)
Sodium: 135 mmol/L (ref 135–145)
Total Bilirubin: 1.2 mg/dL (ref 0.3–1.2)
Total Protein: 8.1 g/dL (ref 6.5–8.1)

## 2018-09-15 MED ORDER — HYDROMORPHONE HCL 1 MG/ML IJ SOLN
1.0000 mg | Freq: Once | INTRAMUSCULAR | Status: AC
  Administered 2018-09-15: 1 mg via INTRAVENOUS
  Filled 2018-09-15: qty 1

## 2018-09-15 MED ORDER — HYDROMORPHONE HCL 1 MG/ML IJ SOLN
INTRAMUSCULAR | Status: AC
  Filled 2018-09-15: qty 1

## 2018-09-15 MED ORDER — HYDROMORPHONE HCL 1 MG/ML IJ SOLN
1.0000 mg | Freq: Once | INTRAMUSCULAR | Status: AC
  Administered 2018-09-15: 1 mg via INTRAVENOUS

## 2018-09-15 NOTE — ED Notes (Signed)
[facility] here for transport to [place].  

## 2018-09-15 NOTE — ED Notes (Signed)
Pt screaming out and states that he wants pain medication now. Pt advised that we will get him back ASAP, but I have to wait on a provider to see him.

## 2018-09-15 NOTE — ED Triage Notes (Addendum)
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 Pt appears anxious and distressed in triage. He states that he took tylenol without relief. Last tx 3 days ago.

## 2018-09-15 NOTE — ED Notes (Signed)
[facility] dispatch notified for need of transport.  

## 2018-09-15 NOTE — ED Notes (Signed)
Report given to [facility] transport team. Currently awaiting their arrival.

## 2018-09-15 NOTE — ED Provider Notes (Signed)
[facility] DEPT Provider Note   CSN: [id number] Arrival date & time: 09/15/18  2136    History   Chief Complaint Chief Complaint  Patient presents with  . Back Pain    HPI David Lam is a 23 y.o. male.     The history is provided by the patient and medical records. No language interpreter was used.  Back Pain  Location:  Lumbar spine (Mid and upper lumbar spine.) Quality:  Stabbing, shooting and aching Pain severity:  Severe Pain is:  Same all the time Onset quality:  Sudden Duration:  4 hours Timing:  Constant Progression:  Worsening Chronicity:  New Relieved by:  Nothing Worsened by:  Nothing Ineffective treatments:  None tried Associated symptoms: leg pain and weakness   Associated symptoms: no abdominal pain, no bladder incontinence, no bowel incontinence, no chest pain, no dysuria, no fever, no headaches, no numbness, no paresthesias, no perianal numbness and no tingling   Risk factors: hx of cancer   Risk factors comment:  Recent xarelto use   Past Medical History:  Diagnosis Date  . Non Hodgkin's lymphoma ([place])   . Non Hodgkin's lymphoma [facility])     Patient Active Problem List   Diagnosis Date Noted  . Hodgkin's lymphoma ([place]) 08/30/2018  . Port-A-Cath in place 07/05/2018  . Hodgkin lymphoma ([place]) 06/05/2018  . Chest pain 05/07/2018  . 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 Rupture of radial collateral ligament of left thumb 12/30/2014  . Left ankle pain 11/16/2014  . Closed fracture of shaft of left radius and ulna 10/31/2014  . Dislocation of carpometacarpal joint of left thumb 10/31/2014    Past Surgical History:  Procedure Laterality Date  . ANKLE SURGERY Left    cleaned up   . FRACTURE SURGERY Left    arm  . IR IMAGING GUIDED PORT INSERTION  06/03/2018  . MEDIASTINOTOMY CHAMBERLAIN MCNEIL N/A 05/10/2018   Procedure: Mediastinal mass biopsy ;  Surgeon: Grace Isaac, MD;  Location[facility] OR;  Service:  Thoracic;  Laterality: N/A;  . ORIF FINGER / THUMB FRACTURE Left         Home Medications    Prior to Admission medications   Medication Sig Start Date End Date Taking? Authorizing Provider  cephALEXin (KEFLEX) 500 MG capsule Take 1 capsule (500 mg total) by mouth 3 (three) times daily. Patient not taking: Reported on 09/04/2018 06/18/18   Harle Stanford., PA-C  diphenhydrAMINE (BENADRYL) 25 mg capsule Take 25 mg by mouth as needed.    [provider]  lidocaine-prilocaine (EMLA) cream Apply 1 application topically as needed. 06/04/18   [name], MD  LORazepam (ATIVAN) 0.5 MG tablet Take 1 tablet (0.5 mg total) by mouth every 6 (six) hours as needed (Nausea or vomiting). 08/30/18   [name], MD  ondansetron (ZOFRAN) 8 MG tablet Take 1 tablet (8 mg total) by mouth 2 (two) times daily as needed. Start on the third day after doxorubicin/cytoxan chemotherapy. 08/30/18   [name], MD  predniSONE (DELTASONE) 20 MG tablet Take 3 tablets (60 mg total) by mouth daily with breakfast. Take daily on days 1-14 of chemotherapy. 08/30/18   [name], MD  procarbazine (MATULANE) 50 MG capsule Take 4 capsules (200 mg total) by mouth daily. Take daily on days 1-7 of each 21 day cycle. Follow low tyramine diet. 09/04/18   [name], MD  prochlorperazine (COMPAZINE) 10 MG tablet Take 1 tablet (10 mg total) by mouth  every 6 (six) hours as needed for nausea or vomiting. 07/19/18   [name], MD  Rivaroxaban 15 & 20 MG TBPK Take as directed on package: Start with one 15mg  tablet by mouth twice a day with food. On Day 22, switch to one 20mg  tablet once a day with food. 09/04/18   Fatima Blank, MD    Family History History reviewed. No pertinent family history.  Social History Social History   Tobacco Use  . Smoking status: Never Smoker  . Smokeless tobacco: Never Used  Substance Use Topics  . Alcohol use: Never    Frequency:  Never  . Drug use: Never     Allergies   Patient has no known allergies.   Review of Systems Review of Systems  Constitutional: Negative for activity change, chills, diaphoresis, fatigue and fever.  HENT: Negative for congestion and rhinorrhea.   Eyes: Negative for visual disturbance.  Respiratory: Negative for cough, chest tightness, shortness of breath, wheezing and stridor.   Cardiovascular: Negative for chest pain, palpitations and leg swelling.  Gastrointestinal: Negative for abdominal distention, abdominal pain, blood in stool, bowel incontinence, constipation, diarrhea, nausea and vomiting.  Genitourinary: Negative for bladder incontinence, difficulty urinating, dysuria and flank pain.  Musculoskeletal: Positive for back pain. Negative for gait problem, neck pain and neck stiffness.  Skin: Negative for rash and wound.  Neurological: Positive for weakness. Negative for dizziness, tingling, seizures, facial asymmetry, light-headedness, numbness, headaches and paresthesias.  Psychiatric/Behavioral: Negative for agitation and confusion.  All other systems reviewed and are negative.    Physical Exam Updated Vital Signs BP 140/72 (BP Location: Right Arm)   Pulse (!) 105   Temp 97.9 F (36.6 C) (Oral)   Resp 16   SpO2 100%   Physical Exam Vitals signs and nursing note reviewed.  Constitutional:      Appearance: He is well-developed.  HENT:     Head: Normocephalic and atraumatic.     Nose: No congestion or rhinorrhea.     Mouth/Throat:     Pharynx: No oropharyngeal exudate or posterior oropharyngeal erythema.  Eyes:     Conjunctiva/sclera: Conjunctivae normal.     Pupils: Pupils are equal, round, and reactive to light.  Neck:     Musculoskeletal: Neck supple. No muscular tenderness.  Cardiovascular:     Rate and Rhythm: Regular rhythm. Tachycardia present.     Heart sounds: No murmur.  Pulmonary:     Effort: Pulmonary effort is normal. No respiratory distress.      Breath sounds: Normal breath sounds. No wheezing, rhonchi or rales.  Abdominal:     General: Abdomen is flat.     Palpations: Abdomen is soft.     Tenderness: There is no abdominal tenderness.  Musculoskeletal:        General: Tenderness present.     Lumbar back: He exhibits tenderness and pain. He exhibits no spasm.       Back:     Right lower leg: No edema.     Left lower leg: No edema.     Comments: Mild low back tenderness.  Pain is much more severe than exam.  Skin:    General: Skin is warm and dry.     Capillary Refill: Capillary refill takes less than 2 seconds.     Findings: No erythema.  Neurological:     General: No focal deficit present.     Mental Status: He is alert and oriented to person, place, and time.  GCS: GCS eye subscore is 4. GCS verbal subscore is 5. GCS motor subscore is 6.     Sensory: No sensory deficit.     Motor: Weakness (subjective bilateral leg weakness) present.     Coordination: Finger-Nose-Finger Test normal.     Deep Tendon Reflexes: Reflexes normal.     Comments: Subjective bilateral leg weakness.  Normal leg sensation.  Normal pulses.  Normal appearance of legs.  Normal reflexes in legs.  Psychiatric:        Mood and Affect: Mood normal.      ED Treatments / Results  Labs (all labs ordered are listed, but only abnormal results are displayed) Labs Reviewed  CBC WITH DIFFERENTIAL/PLATELET - Abnormal; Notable for the following components:      Result Value   WBC 0.7 (*)    Hemoglobin 11.7 (*)    HCT 34.6 (*)    Platelets 109 (*)    nRBC 6.9 (*)    All other components within normal limits  COMPREHENSIVE METABOLIC PANEL - Abnormal; Notable for the following components:   Glucose, Bld 143 (*)    ALT 48 (*)    All other components within normal limits  URINE CULTURE  URINALYSIS, ROUTINE W REFLEX MICROSCOPIC    EKG None  Radiology No results found.  Procedures Procedures (including critical care time)  Medications Ordered  in ED Medications  HYDROmorphone (DILAUDID) injection 1 mg (1 mg Intravenous Given 09/15/18 2257)  HYDROmorphone (DILAUDID) injection 1 mg (1 mg Intravenous Given 09/15/18 2334)  HYDROmorphone (DILAUDID) injection 1 mg (1 mg Intravenous Given 09/16/18 0036)     Initial Impression / Assessment and Plan / ED Course  I have reviewed the triage vital signs and the nursing notes.  Pertinent labs & imaging results that were available during my care of the patient were reviewed by me and considered in my medical decision making (see chart for details).        Misty Rago is a 23 y.o. male with a past medical history significant for non-Hodgkin lymphoma status post chemotherapy and recent diagnosis of pulmonary embolism started on Xarelto who presents with sudden onset severe low back pain and bilateral leg weakness.  Patient reports that he was sitting watching TV when he had sudden onset of low back pain that is greater than 10 out of 10 in severity.  He reports that his legs feel weak and he is not able to ambulate with him.  He reports no loss of bowel or bladder function and no significant numbness.  He has never had any pain like this before.  He denies any recent fevers, chills, chest pain, shortness of breath, nausea vomiting.  No abdominal symptoms.  No falls.  On exam, patient has minimal tenderness in his lumbar spine but he is writhing and screaming in uncontrolled pain.  He is able to lift his legs but he reports they feel weak compared to baseline.  Normal pulses.  Normal sensation.  Patient refused GU exam.  Abdomen nontender and lungs are clear.  Patient tachycardic on arrival.  Patient description of symptoms have low suspicion for epidural abscess however I am concerned about a paraspinal or epidural hematoma from his new Xarelto use.  Patient will have screening labs but will need MRI of his thoracic and lumbar spine to look for bleed causing pressure on his spinal cord.  At this  facility we do not have MRI at this time.  [facility] emergent  MRI with and without contrast.  MRI orders were placed.  Patient given Dilaudid with minimal improvement in pain.  Given his leg weakness and Dilaudid administration, do not feel he is safe for POV transfer.  Transfer service will be utilized.  Patient accepted to the emergency department by Dr. Duffy Bruce for MRI and reassessment.  Anticipate following up on MRI results.  Low suspicion for a retroperitoneal bleed based on lack of other tenderness on his back.  Urine will be sent but have low suspicion for pyelonephritis causing his pain.  Anticipate reassessment.  If patient is able to ambulate and work-up is clear reassuring, he may be safe for discharge home.  Patient is unable to ambulate or abnormalities are discovered, anticipate admission.  After patient was already transferred, white blood cell count came back low at 0.7.  Patient reports that he received his "white blood cell injection" on Friday.  Given lack of fevers or chills, have low suspicion for abscess however MRI will further look for this.    Final Clinical Impressions(s) / ED Diagnoses   Final diagnoses:  Bilateral leg weakness  Severe low back pain    ED Discharge Orders    None      Clinical Impression: 1. Bilateral leg weakness   2. Severe low back pain     Disposition: Transfer to [facility] for MRI to rule out spinal pathology causing severe low back pain and bilateral leg weakness.  This note was prepared with assistance of Systems analyst. Occasional wrong-word or sound-a-like substitutions may have occurred due to the inherent limitations of voice recognition software.     Willa Brocks, Gwenyth Allegra, MD 09/16/18 [phone]

## 2018-09-16 ENCOUNTER — Encounter (HOSPITAL_COMMUNITY): Payer: Self-pay | Admitting: Internal Medicine

## 2018-09-16 ENCOUNTER — Other Ambulatory Visit: Payer: Self-pay

## 2018-09-16 ENCOUNTER — Emergency Department (HOSPITAL_COMMUNITY): Payer: BLUE CROSS/BLUE SHIELD

## 2018-09-16 ENCOUNTER — Telehealth: Payer: Self-pay | Admitting: *Deleted

## 2018-09-16 DIAGNOSIS — M549 Dorsalgia, unspecified: Secondary | ICD-10-CM | POA: Diagnosis present

## 2018-09-16 DIAGNOSIS — Z7901 Long term (current) use of anticoagulants: Secondary | ICD-10-CM | POA: Diagnosis not present

## 2018-09-16 DIAGNOSIS — D6959 Other secondary thrombocytopenia: Secondary | ICD-10-CM | POA: Diagnosis present

## 2018-09-16 DIAGNOSIS — Z79899 Other long term (current) drug therapy: Secondary | ICD-10-CM | POA: Diagnosis not present

## 2018-09-16 DIAGNOSIS — D709 Neutropenia, unspecified: Secondary | ICD-10-CM

## 2018-09-16 DIAGNOSIS — I2699 Other pulmonary embolism without acute cor pulmonale: Secondary | ICD-10-CM | POA: Diagnosis present

## 2018-09-16 DIAGNOSIS — Z23 Encounter for immunization: Secondary | ICD-10-CM | POA: Diagnosis not present

## 2018-09-16 DIAGNOSIS — C8190 Hodgkin lymphoma, unspecified, unspecified site: Secondary | ICD-10-CM | POA: Diagnosis present

## 2018-09-16 DIAGNOSIS — M4807 Spinal stenosis, lumbosacral region: Secondary | ICD-10-CM | POA: Diagnosis present

## 2018-09-16 DIAGNOSIS — R5081 Fever presenting with conditions classified elsewhere: Secondary | ICD-10-CM

## 2018-09-16 DIAGNOSIS — R531 Weakness: Secondary | ICD-10-CM | POA: Diagnosis present

## 2018-09-16 DIAGNOSIS — E876 Hypokalemia: Secondary | ICD-10-CM | POA: Diagnosis not present

## 2018-09-16 DIAGNOSIS — R509 Fever, unspecified: Secondary | ICD-10-CM | POA: Diagnosis not present

## 2018-09-16 DIAGNOSIS — M5127 Other intervertebral disc displacement, lumbosacral region: Secondary | ICD-10-CM | POA: Diagnosis present

## 2018-09-16 DIAGNOSIS — Z95828 Presence of other vascular implants and grafts: Secondary | ICD-10-CM | POA: Diagnosis not present

## 2018-09-16 DIAGNOSIS — D630 Anemia in neoplastic disease: Secondary | ICD-10-CM | POA: Diagnosis present

## 2018-09-16 DIAGNOSIS — M545 Low back pain: Secondary | ICD-10-CM | POA: Diagnosis present

## 2018-09-16 DIAGNOSIS — I2782 Chronic pulmonary embolism: Secondary | ICD-10-CM | POA: Diagnosis not present

## 2018-09-16 DIAGNOSIS — A419 Sepsis, unspecified organism: Secondary | ICD-10-CM | POA: Diagnosis present

## 2018-09-16 LAB — URINALYSIS, ROUTINE W REFLEX MICROSCOPIC
Bilirubin Urine: NEGATIVE
Glucose, UA: NEGATIVE mg/dL
Hgb urine dipstick: NEGATIVE
Ketones, ur: NEGATIVE mg/dL
Leukocytes,Ua: NEGATIVE
Nitrite: NEGATIVE
Protein, ur: NEGATIVE mg/dL
Specific Gravity, Urine: 1.017 (ref 1.005–1.030)
pH: 8 (ref 5.0–8.0)

## 2018-09-16 LAB — CBC WITH DIFFERENTIAL/PLATELET
HCT: 34.6 % — ABNORMAL LOW (ref 39.0–52.0)
Hemoglobin: 11.7 g/dL — ABNORMAL LOW (ref 13.0–17.0)
MCH: 27.1 pg (ref 26.0–34.0)
MCHC: 33.8 g/dL (ref 30.0–36.0)
MCV: 80.3 fL (ref 80.0–100.0)
NRBC: 6.9 % — AB (ref 0.0–0.2)
Platelets: 109 10*3/uL — ABNORMAL LOW (ref 150–400)
RBC: 4.31 MIL/uL (ref 4.22–5.81)
RDW: 15.5 % (ref 11.5–15.5)
WBC: 0.7 10*3/uL — CL (ref 4.0–10.5)

## 2018-09-16 LAB — LACTIC ACID, PLASMA
LACTIC ACID, VENOUS: 2.3 mmol/L — AB (ref 0.5–1.9)
Lactic Acid, Venous: 3.5 mmol/L (ref 0.5–1.9)
Lactic Acid, Venous: 3.9 mmol/L (ref 0.5–1.9)

## 2018-09-16 LAB — INFLUENZA PANEL BY PCR (TYPE A & B)
Influenza A By PCR: NEGATIVE
Influenza B By PCR: NEGATIVE

## 2018-09-16 LAB — PATHOLOGIST SMEAR REVIEW

## 2018-09-16 MED ORDER — ACETAMINOPHEN 325 MG PO TABS
650.0000 mg | ORAL_TABLET | Freq: Four times a day (QID) | ORAL | Status: DC | PRN
  Administered 2018-09-17 – 2018-09-18 (×3): 650 mg via ORAL
  Filled 2018-09-16 (×3): qty 2

## 2018-09-16 MED ORDER — VANCOMYCIN HCL 10 G IV SOLR
1250.0000 mg | Freq: Three times a day (TID) | INTRAVENOUS | Status: DC
  Administered 2018-09-16 – 2018-09-18 (×6): 1250 mg via INTRAVENOUS
  Filled 2018-09-16 (×7): qty 1250

## 2018-09-16 MED ORDER — ACETAMINOPHEN 500 MG PO TABS
1000.0000 mg | ORAL_TABLET | Freq: Once | ORAL | Status: AC
  Administered 2018-09-16: 1000 mg via ORAL
  Filled 2018-09-16: qty 2

## 2018-09-16 MED ORDER — RIVAROXABAN 20 MG PO TABS: 20.0000 mg | ORAL_TABLET | Freq: Every day | ORAL | Status: DC

## 2018-09-16 MED ORDER — SODIUM CHLORIDE 0.9 % IV BOLUS (SEPSIS)
1000.0000 mL | Freq: Once | INTRAVENOUS | Status: AC
Start: 2018-09-16 — End: 2018-09-16
  Administered 2018-09-16: 1000 mL via INTRAVENOUS

## 2018-09-16 MED ORDER — RIVAROXABAN 15 MG PO TABS
15.0000 mg | ORAL_TABLET | Freq: Two times a day (BID) | ORAL | Status: DC
  Filled 2018-09-16: qty 1

## 2018-09-16 MED ORDER — SODIUM CHLORIDE 0.9% FLUSH
10.0000 mL | Freq: Two times a day (BID) | INTRAVENOUS | Status: DC
  Administered 2018-09-16: 10 mL

## 2018-09-16 MED ORDER — PREDNISONE 50 MG PO TABS
60.0000 mg | ORAL_TABLET | Freq: Every day | ORAL | Status: DC
  Administered 2018-09-16 – 2018-09-19 (×4): 60 mg via ORAL
  Filled 2018-09-16: qty 1
  Filled 2018-09-16: qty 3
  Filled 2018-09-16 (×3): qty 1

## 2018-09-16 MED ORDER — SODIUM CHLORIDE 0.9 % IV BOLUS (SEPSIS): 1000.0000 mL | Freq: Once | INTRAVENOUS | Status: DC

## 2018-09-16 MED ORDER — KETAMINE HCL 50 MG/5ML IJ SOSY
0.3000 mg/kg | PREFILLED_SYRINGE | Freq: Once | INTRAMUSCULAR | Status: AC
  Administered 2018-09-16: 25 mg via INTRAVENOUS
  Filled 2018-09-16: qty 5

## 2018-09-16 MED ORDER — SODIUM CHLORIDE 0.9 % IV SOLN
2.0000 g | Freq: Three times a day (TID) | INTRAVENOUS | Status: DC
  Administered 2018-09-16 – 2018-09-20 (×12): 2 g via INTRAVENOUS
  Filled 2018-09-16 (×14): qty 2

## 2018-09-16 MED ORDER — SODIUM CHLORIDE 0.9 % IV SOLN
2.0000 g | Freq: Once | INTRAVENOUS | Status: AC
  Administered 2018-09-16: 2 g via INTRAVENOUS
  Filled 2018-09-16: qty 2

## 2018-09-16 MED ORDER — KETOROLAC TROMETHAMINE 30 MG/ML IJ SOLN
30.0000 mg | Freq: Once | INTRAMUSCULAR | Status: AC
Start: 2018-09-16 — End: 2018-09-16
  Administered 2018-09-16: 30 mg via INTRAVENOUS
  Filled 2018-09-16: qty 1

## 2018-09-16 MED ORDER — DIPHENHYDRAMINE HCL 25 MG PO CAPS
25.0000 mg | ORAL_CAPSULE | Freq: Four times a day (QID) | ORAL | Status: DC | PRN
  Administered 2018-09-16: 25 mg via ORAL
  Filled 2018-09-16: qty 1

## 2018-09-16 MED ORDER — HYDROCODONE-ACETAMINOPHEN 5-325 MG PO TABS
1.0000 | ORAL_TABLET | ORAL | Status: DC | PRN
  Administered 2018-09-16 – 2018-09-17 (×4): 2 via ORAL
  Filled 2018-09-16 (×4): qty 2

## 2018-09-16 MED ORDER — PROCHLORPERAZINE MALEATE 10 MG PO TABS
10.0000 mg | ORAL_TABLET | Freq: Four times a day (QID) | ORAL | Status: DC | PRN
  Filled 2018-09-16: qty 1

## 2018-09-16 MED ORDER — ACETAMINOPHEN 650 MG RE SUPP: 650.0000 mg | Freq: Four times a day (QID) | RECTAL | Status: DC | PRN

## 2018-09-16 MED ORDER — KETOROLAC TROMETHAMINE 30 MG/ML IJ SOLN
30.0000 mg | Freq: Four times a day (QID) | INTRAMUSCULAR | Status: DC | PRN
  Administered 2018-09-16 – 2018-09-17 (×4): 30 mg via INTRAVENOUS
  Filled 2018-09-16 (×4): qty 1

## 2018-09-16 MED ORDER — DIAZEPAM 5 MG/ML IJ SOLN
2.5000 mg | Freq: Once | INTRAMUSCULAR | Status: AC
  Administered 2018-09-16: 2.5 mg via INTRAVENOUS
  Filled 2018-09-16: qty 2

## 2018-09-16 MED ORDER — HYDROMORPHONE HCL 1 MG/ML IJ SOLN
1.0000 mg | Freq: Once | INTRAMUSCULAR | Status: AC
  Administered 2018-09-16: 1 mg via INTRAVENOUS
  Filled 2018-09-16: qty 1

## 2018-09-16 MED ORDER — VANCOMYCIN HCL IN DEXTROSE 1-5 GM/200ML-% IV SOLN
1000.0000 mg | Freq: Once | INTRAVENOUS | Status: AC
  Administered 2018-09-16: 1000 mg via INTRAVENOUS
  Filled 2018-09-16: qty 200

## 2018-09-16 MED ORDER — SODIUM CHLORIDE 0.9 % IV BOLUS (SEPSIS)
1000.0000 mL | Freq: Once | INTRAVENOUS | Status: AC
  Administered 2018-09-16: 1000 mL via INTRAVENOUS

## 2018-09-16 MED ORDER — GADOBUTROL 1 MMOL/ML IV SOLN
8.0000 mL | Freq: Once | INTRAVENOUS | Status: AC | PRN
  Administered 2018-09-16: 8 mL via INTRAVENOUS

## 2018-09-16 MED ORDER — SODIUM CHLORIDE 0.9 % IV SOLN
INTRAVENOUS | Status: DC
  Administered 2018-09-16 – 2018-09-17 (×2): via INTRAVENOUS

## 2018-09-16 MED ORDER — RIVAROXABAN 15 MG PO TABS
15.0000 mg | ORAL_TABLET | Freq: Two times a day (BID) | ORAL | Status: DC
  Administered 2018-09-16 – 2018-09-20 (×10): 15 mg via ORAL
  Filled 2018-09-16 (×11): qty 1

## 2018-09-16 MED ORDER — HYDROMORPHONE HCL 1 MG/ML IJ SOLN
0.5000 mg | INTRAMUSCULAR | Status: DC | PRN
  Administered 2018-09-16 – 2018-09-17 (×4): 0.5 mg via INTRAVENOUS
  Filled 2018-09-16 (×2): qty 0.5
  Filled 2018-09-16: qty 1
  Filled 2018-09-16: qty 0.5

## 2018-09-16 MED ORDER — LORAZEPAM 0.5 MG PO TABS
0.5000 mg | ORAL_TABLET | Freq: Four times a day (QID) | ORAL | Status: DC | PRN
  Filled 2018-09-16: qty 1

## 2018-09-16 MED ORDER — SODIUM CHLORIDE 0.9% FLUSH: 10.0000 mL | INTRAVENOUS | Status: DC | PRN

## 2018-09-16 NOTE — Progress Notes (Signed)
Pharmacy Antibiotic Note  David Lam is a 23 y.o. male admitted on 09/15/2018 with febrile neutropenia.  Pharmacy has been consulted for vancomycin/cefepime dosing. Afebrile, WBC 0.7, LA 3.5>3.9. SCr 0.83 on admit, CrCl>100.  Patient received cefepime 2g x 1 at ~0655 and vancomycin 1g IV x 1 at 0745 this AM.  Plan: Cefepime 2g IV q8h Vancomycin 1250 mg IV Q 8 hrs. Goal AUC 400-550. Will start maintenance dose earlier due to less than adequate load given this AM Expected AUC: 539 SCr used: 0.83  Monitor clinical progress, c/s, renal function F/u de-escalation plan/LOT, vancomycin trough as indicated   Height: 5\' 8"  (172.7 cm) Weight: 186 lb (84.4 kg) IBW/kg (Calculated) : 68.4  Temp (24hrs), Avg:98.5 F (36.9 C), Min:97.9 F (36.6 C), Max:99.1 F (37.3 C)  Recent Labs  Lab 09/11/18 1200 09/15/18 2256 09/16/18 0633 09/16/18 0831  WBC 3.8* 0.7*  --   --   CREATININE 0.91 0.83  --   --   LATICACIDVEN  --   --  3.5* 3.9*    Estimated Creatinine Clearance: 145.2 mL/min (by C-G formula based on SCr of 0.83 mg/dL).    No Known Allergies  Antimicrobials this admission: 2/24 vancomycin >>  2/24 cefepime >>   Dose adjustments this admission:   Microbiology results:   David Lam, PharmD, BCPS Clinical Pharmacist 09/16/2018 12:28 PM

## 2018-09-16 NOTE — ED Notes (Addendum)
ED TO INPATIENT HANDOFF REPORT  ED Nurse Name and Phone #: Karlton Lemon  S Name/Age/Gender David Lam 23 y.o. male Room/Bed: 031C/031C  Code Status   Code Status: Full Code  Home/SNF/Other Home {Patient oriented x4 Is this baseline? 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 Pt appears anxious and distressed in triage. He states that he took tylenol without relief. Last tx 3 days ago.    Allergies No Known Allergies  Level of Care/Admitting Diagnosis ED Disposition    ED Disposition Condition [facility]: [place] [[id number]]  Level of Care: Progressive [102]  Diagnosis: Neutropenia with fever [facility]) [[id number]]  Admitting Physician: Ivor Costa [[id number]]  Attending Physician: Ivor Costa [phone]  Estimated length of stay: past midnight tomorrow  Certification:: I certify this patient will need inpatient services for at least 2 midnights  PT Class (Do Not Modify): Inpatient [101]  PT Acc Code (Do Not Modify): Private [1]       B Medical/Surgery History Past Medical History:  Diagnosis Date  . Non Hodgkin's lymphoma ([place])   . Non Hodgkin's lymphoma [facility])    Past Surgical History:  Procedure Laterality Date  . ANKLE SURGERY Left    cleaned up   . FRACTURE SURGERY Left    arm  . IR IMAGING GUIDED PORT INSERTION  06/03/2018  . MEDIASTINOTOMY CHAMBERLAIN MCNEIL N/A 05/10/2018   Procedure: Mediastinal mass biopsy ;  Surgeon: Grace Isaac, MD;  Location: [facility] OR;  Service: Thoracic;  Laterality: N/A;  . ORIF FINGER / THUMB FRACTURE Left      A IV Location/Drains/Wounds Patient Lines/Drains/Airways Status   Active Line/Drains/Airways    Name:   Placement date:   Placement time:   Site:   Days:   Implanted Port 06/03/18 Right Chest   06/03/18    0953    Chest   105   Peripheral IV 09/15/18  Right Antecubital   09/15/18    2255    Antecubital   1   Incision (Closed) 05/10/18 Chest   05/10/18    1526     129   Wound / Incision (Open or Dehisced) 05/08/18 Non-pressure wound Foot Left;Proximal   05/08/18    0900    Foot   131          Intake/Output Last 24 hours  Intake/Output Summary (Last 24 hours) at 09/16/2018 1521 Last data filed at 09/16/2018 1505 Gross per 24 hour  Intake 4300 ml  Output 1250 ml  Net 3050 ml    Labs/Imaging Results for orders placed or performed during the hospital encounter of 09/15/18 (from the past 48 hour(s))  CBC with Differential     Status: Abnormal   Collection Time: 09/15/18 10:56 PM  Result Value Ref Range   WBC 0.7 (LL) 4.0 - 10.5 K/uL    Comment: REPEATED TO VERIFY WHITE COUNT CONFIRMED ON SMEAR THIS CRITICAL RESULT HAS VERIFIED AND BEEN CALLED TO A,HODGES BY AISHA MOHAMED ON 02 23 2020 AT 2348, AND HAS BEEN READ BACK.     RBC 4.31 4.22 - 5.81 MIL/uL   Hemoglobin 11.7 (L) 13.0 - 17.0 g/dL   HCT 34.6 (L) 39.0 - 52.0 %   MCV 80.3 80.0 - 100.0 fL   MCH 27.1 26.0 - 34.0 pg   MCHC 33.8 30.0 - 36.0 g/dL  RDW 15.5 11.5 - 15.5 %   Platelets 109 (L) 150 - 400 K/uL    Comment: REPEATED TO VERIFY PLATELET COUNT CONFIRMED BY SMEAR SPECIMEN CHECKED FOR CLOTS Immature Platelet Fraction may be clinically indicated, consider ordering this additional test [id number]    nRBC 6.9 (H) 0.0 - 0.2 %   WBC Morphology ATYPICAL LYMPHOCYTES     Comment: MODERATE LEFT SHIFT (>5% METAS AND MYELOS,OCC PRO NOTED) DOHLE BODIES    RBC Morphology RARE NRBCs     Comment: POLYCHROMASIA PRESENT   Smear Review TOO FEW TO COUNT, SMEAR AVAILABLE FOR REVIEW     Comment: Performed at [facility], [place] [address] [place], [place] [address]  Comprehensive metabolic panel     Status: Abnormal   Collection Time: 09/15/18 10:56 PM  Result Value Ref Range   Sodium 135 135 - 145 mmol/L   Potassium 3.7 3.5 - 5.1 mmol/L   Chloride 99 98 - 111  mmol/L   CO2 24 22 - 32 mmol/L   Glucose, Bld 143 (H) 70 - 99 mg/dL   BUN 14 6 - 20 mg/dL   Creatinine, Ser 0.83 0.61 - 1.24 mg/dL   Calcium 9.6 8.9 - 10.3 mg/dL   Total Protein 8.1 6.5 - 8.1 g/dL   Albumin 4.5 3.5 - 5.0 g/dL   AST 22 15 - 41 U/L   ALT 48 (H) 0 - 44 U/L   Alkaline Phosphatase 71 38 - 126 U/L   Total Bilirubin 1.2 0.3 - 1.2 mg/dL   GFR calc non Af Amer >60 >60 mL/min   GFR calc Af Amer >60 >60 mL/min   Anion gap 12 5 - 15    Comment: Performed at [facility], [place] [address] [place], [place] [address]  Pathologist smear review     Status: None   Collection Time: 09/15/18 10:56 PM  Result Value Ref Range   Path Review Reviewed By Violet Baldy, M.D.     Comment: 02.24.20 PANCYTOPENIA Performed at [facility], [place] [address], [place], [place] [address]   Urinalysis, Routine w reflex microscopic     Status: None   Collection Time: 09/16/18  2:33 AM  Result Value Ref Range   Color, Urine YELLOW YELLOW   APPearance CLEAR CLEAR   Specific Gravity, Urine 1.017 1.005 - 1.030   pH 8.0 5.0 - 8.0   Glucose, UA NEGATIVE NEGATIVE mg/dL   Hgb urine dipstick NEGATIVE NEGATIVE   Bilirubin Urine NEGATIVE NEGATIVE   Ketones, ur NEGATIVE NEGATIVE mg/dL   Protein, ur NEGATIVE NEGATIVE mg/dL   Nitrite NEGATIVE NEGATIVE   Leukocytes,Ua NEGATIVE NEGATIVE    Comment: Performed at [place] [address] [place],  [address]  Influenza panel by PCR (type A & B)     Status: None   Collection Time: 09/16/18  6:13 AM  Result Value Ref Range   Influenza A By PCR NEGATIVE NEGATIVE   Influenza B By PCR NEGATIVE NEGATIVE    Comment: (NOTE) The Xpert Xpress Flu assay is intended as an aid in the diagnosis of  influenza and should not be used as a sole basis for treatment.  This  assay is FDA approved for nasopharyngeal swab specimens only. Nasal  washings and aspirates are unacceptable for Xpert Xpress Flu testing. Performed  at [facility], [place] [address] [place], [place] [address]   Lactic acid, plasma     Status: Abnormal   Collection  Time: 09/16/18  6:33 AM  Result Value Ref Range   Lactic Acid, Venous 3.5 (HH) 0.5 - 1.9 mmol/L    Comment: CRITICAL RESULT CALLED TO, READ BACK BY AND VERIFIED WITH: RAND,K RN 09/16/2018 0732 JORDANS Performed at [facility], [place] [address], [place], [place] [address]   Lactic acid, plasma     Status: Abnormal   Collection Time: 09/16/18  8:31 AM  Result Value Ref Range   Lactic Acid, Venous 3.9 (HH) 0.5 - 1.9 mmol/L    Comment: CRITICAL RESULT CALLED TO, READ BACK BY AND VERIFIED WITH: K RAND RN [id number] BY A BENNETT Performed at [place] [address] [place], [place] [address]    Mr Thoracic Spine W Wo Contrast  Result Date: 09/16/2018 CLINICAL DATA:  23 y/o M; sudden onset severe lower back pain and bilateral leg weakness. History of non-Hodgkin's lymphoma. EXAM: MRI THORACIC AND LUMBAR SPINE WITHOUT AND WITH CONTRAST TECHNIQUE: Multiplanar and multiecho pulse sequences of the thoracic and lumbar spine were obtained without and with intravenous contrast. CONTRAST:  8 cc Gadavist COMPARISON:  09/04/2018 CT chest FINDINGS: MRI THORACIC SPINE FINDINGS Motion degradation of axial sequences. Alignment:  Physiologic. Vertebrae: No fracture, evidence of discitis, or bone lesion. No abnormal enhancement. Cord:  Normal signal and morphology.  No abnormal enhancement. Paraspinal and other soft tissues: Negative. Disc levels: No significant disc displacement, foraminal stenosis, or canal stenosis. MRI LUMBAR SPINE FINDINGS Segmentation:  Standard. Alignment:  Physiologic. Vertebrae: No fracture, evidence of discitis, or bone lesion. No abnormal enhancement. Conus medullaris: Extends to the L1 level and appears normal. No abnormal enhancement. Paraspinal and other soft tissues: Right kidney lower pole 16 mm cyst. Disc levels: L1-2: No significant disc  displacement, foraminal stenosis, or canal stenosis. L2-3: No significant disc displacement, foraminal stenosis, or canal stenosis. L3-4: No significant disc displacement, foraminal stenosis, or canal stenosis. L4-5: No significant disc displacement, foraminal stenosis, or canal stenosis. L5-S1: Central and left subarticular disc protrusion with annular fissure contacting the descending S1 nerve roots in the lateral recesses bilaterally. Mild bilateral foraminal stenosis and spinal canal stenosis. IMPRESSION: MRI thoracic spine: 1. Motion degradation of axial sequences. 2. No abnormal osseous or cord signal abnormality. 3. No significant disc displacement, foraminal stenosis, or canal stenosis. MRI lumbar spine: 1. No abnormal osseous or cord signal abnormality. 2. L5-S1 central and left subarticular disc protrusion with annular fissure contacting the descending S1 nerve roots in the lateral recesses as well as resulting in mild bilateral foraminal stenosis and spinal canal stenosis. Electronically Signed   By: Kristine Garbe M.D.   On: 09/16/2018 05:40   Mr Lumbar Spine W Wo Contrast  Result Date: 09/16/2018 CLINICAL DATA:  23 y/o M; sudden onset severe lower back pain and bilateral leg weakness. History of non-Hodgkin's lymphoma. EXAM: MRI THORACIC AND LUMBAR SPINE WITHOUT AND WITH CONTRAST TECHNIQUE: Multiplanar and multiecho pulse sequences of the thoracic and lumbar spine were obtained without and with intravenous contrast. CONTRAST:  8 cc Gadavist COMPARISON:  09/04/2018 CT chest FINDINGS: MRI THORACIC SPINE FINDINGS Motion degradation of axial sequences. Alignment:  Physiologic. Vertebrae: No fracture, evidence of discitis, or bone lesion. No abnormal enhancement. Cord:  Normal signal and morphology.  No abnormal enhancement. Paraspinal and other soft tissues: Negative. Disc levels: No significant disc displacement, foraminal stenosis, or canal stenosis. MRI LUMBAR SPINE FINDINGS Segmentation:   Standard. Alignment:  Physiologic. Vertebrae: No fracture, evidence of discitis, or bone lesion. No abnormal enhancement. Conus  medullaris: Extends to the L1 level and appears normal. No abnormal enhancement. Paraspinal and other soft tissues: Right kidney lower pole 16 mm cyst. Disc levels: L1-2: No significant disc displacement, foraminal stenosis, or canal stenosis. L2-3: No significant disc displacement, foraminal stenosis, or canal stenosis. L3-4: No significant disc displacement, foraminal stenosis, or canal stenosis. L4-5: No significant disc displacement, foraminal stenosis, or canal stenosis. L5-S1: Central and left subarticular disc protrusion with annular fissure contacting the descending S1 nerve roots in the lateral recesses bilaterally. Mild bilateral foraminal stenosis and spinal canal stenosis. IMPRESSION: MRI thoracic spine: 1. Motion degradation of axial sequences. 2. No abnormal osseous or cord signal abnormality. 3. No significant disc displacement, foraminal stenosis, or canal stenosis. MRI lumbar spine: 1. No abnormal osseous or cord signal abnormality. 2. L5-S1 central and left subarticular disc protrusion with annular fissure contacting the descending S1 nerve roots in the lateral recesses as well as resulting in mild bilateral foraminal stenosis and spinal canal stenosis. Electronically Signed   By: Kristine Garbe M.D.   On: 09/16/2018 05:40   Dg Chest Portable 1 View  Result Date: 09/16/2018 CLINICAL DATA:  History of non-Hodgkin's lymphoma with back pain, initial encounter EXAM: PORTABLE CHEST 1 VIEW COMPARISON:  09/04/2018 FINDINGS: Cardiac shadow is stable. Right chest wall port is again identified and stable. Lungs are well aerated bilaterally. No focal infiltrate or sizable effusion is seen. Previously seen lymphadenopathy is stable given some slight variation in patient positioning. No bony abnormality is noted. IMPRESSION: No acute abnormality noted. Stable changes of  lymphadenopathy consistent with the given clinical history. Electronically Signed   By: Inez Catalina M.D.   On: 09/16/2018 07:03    Pending Labs Unresulted Labs (From admission, onward)    Start     Ordered   09/17/18 9371  Basic metabolic panel  Tomorrow morning,   R     09/16/18 0802   09/17/18 0500  CBC with Differential/Platelet  Tomorrow morning,   R     09/16/18 0802   09/16/18 0635  Urine Culture  Add-on,   R    Comments:  Please get clean catch specimen ONLY (DO NOT obtain from urinal)    09/16/18 0634   09/16/18 0613  Blood culture (routine x 2)  BLOOD CULTURE X 2,   STAT     09/16/18 0613          Vitals/Pain Today's Vitals   09/16/18 1300 09/16/18 1330 09/16/18 1430 09/16/18 1431  BP: (!) 141/86 122/77 (!) 148/97   Pulse: (!) 108 (!) 105 (!) 105   Resp: (!) 22 (!) 22 (!) 23   Temp:      TempSrc:      SpO2: 98% 99% 99%   Weight:      Height:      PainSc:    6     Isolation Precautions Protective Precautions  Medications Medications  0.9 %  sodium chloride infusion ( Intravenous New Bag/Given 09/16/18 0648)  LORazepam (ATIVAN) tablet 0.5 mg (has no administration in time range)  prochlorperazine (COMPAZINE) tablet 10 mg (has no administration in time range)  diphenhydrAMINE (BENADRYL) capsule 25 mg (has no administration in time range)  acetaminophen (TYLENOL) tablet 650 mg (has no administration in time range)    Or  acetaminophen (TYLENOL) suppository 650 mg (has no administration in time range)  ketorolac (TORADOL) 30 MG/ML injection 30 mg (30 mg Intravenous Given 09/16/18 1207)  HYDROcodone-acetaminophen (NORCO/VICODIN) 5-325 MG per tablet 1-2 tablet (2 tablets  Oral Given 09/16/18 0820)  Rivaroxaban (XARELTO) tablet 15 mg (15 mg Oral Given 09/16/18 1206)    Followed by  rivaroxaban (XARELTO) tablet 20 mg (has no administration in time range)  HYDROmorphone (DILAUDID) injection 0.5 mg (0.5 mg Intravenous Given 09/16/18 1351)  ceFEPIme (MAXIPIME) 2 g in  sodium chloride 0.9 % 100 mL IVPB (has no administration in time range)  vancomycin (VANCOCIN) 1,250 mg in sodium chloride 0.9 % 250 mL IVPB (1,250 mg Intravenous New Bag/Given 09/16/18 1354)  predniSONE (DELTASONE) tablet 60 mg (60 mg Oral Given 09/16/18 1503)  HYDROmorphone (DILAUDID) injection 1 mg (1 mg Intravenous Given 09/15/18 2257)  HYDROmorphone (DILAUDID) injection 1 mg (1 mg Intravenous Given 09/15/18 2334)  HYDROmorphone (DILAUDID) injection 1 mg (1 mg Intravenous Given 09/16/18 0036)  diazepam (VALIUM) injection 2.5 mg (2.5 mg Intravenous Given 09/16/18 0116)  ketamine 50 mg in normal saline 5 mL (10 mg/mL) syringe (25 mg Intravenous Given 09/16/18 0228)  HYDROmorphone (DILAUDID) injection 1 mg (1 mg Intravenous Given 09/16/18 0333)  gadobutrol (GADAVIST) 1 MMOL/ML injection 8 mL (8 mLs Intravenous Contrast Given 09/16/18 0453)  HYDROmorphone (DILAUDID) injection 1 mg (1 mg Intravenous Given 09/16/18 0600)  ketorolac (TORADOL) 30 MG/ML injection 30 mg (30 mg Intravenous Given 09/16/18 0653)  vancomycin (VANCOCIN) IVPB 1000 mg/200 mL premix ( Intravenous Stopped 09/16/18 0842)  ceFEPIme (MAXIPIME) 2 g in sodium chloride 0.9 % 100 mL IVPB ( Intravenous Stopped 09/16/18 0722)  acetaminophen (TYLENOL) tablet 1,000 mg (1,000 mg Oral Given 09/16/18 0654)  sodium chloride 0.9 % bolus 1,000 mL (0 mLs Intravenous Stopped 09/16/18 1432)  sodium chloride 0.9 % bolus 1,000 mL (0 mLs Intravenous Stopped 09/16/18 0743)    And  sodium chloride 0.9 % bolus 1,000 mL (0 mLs Intravenous Stopped 09/16/18 1130)    And  sodium chloride 0.9 % bolus 1,000 mL (0 mLs Intravenous Stopped 09/16/18 0743)    Mobility walks Low fall risk   Focused Assessments Neuro Assessment Handoff:  Cardiac Rhythm: Sinus tachycardia       Neuro Assessment: Within Defined Limits       R Recommendations: See Admitting Provider Note  Report given to:  Alma Friendly RN Additional Notes:

## 2018-09-16 NOTE — ED Notes (Signed)
Admitting paged per RN Jolee Ewing

## 2018-09-16 NOTE — Telephone Encounter (Signed)
Informed Dr. Irene Limbo this am: Received faxed Telephone Advice fax from [place]: patient called 2/23 7:45pm: having pain all over after receiving white cell shot. MD on call contacted and per Dr. Alen Blew - patient to take Motrin /Claritin and go to ED if pain not tolerable.  Patient went to [facility] ED - was evaluated and admitted.

## 2018-09-16 NOTE — H&P (Signed)
History and Physical    David Lam OFB:[id number] DOB: 1996/06/30 DOA: 09/15/2018  PCP: Patient, No Pcp Per  Patient coming from: home    I have personally briefly reviewed patient's old medical records available.   Chief Complaint: severe back pain   HPI: David Lam is a 23 y.o. male with medical history significant of Hodgkin's lymphoma currently on chemotherapy, pulmonary embolism on Xarelto who presented to the emergency room last night with 1 day of severe, acute onset low back pain with generalized weakness and weakness of both legs.  Patient stated no history of back pain.  He started having back pain all of a sudden while resting at evening.  No bladder or bowel incontinence.  Feels weak overall also feels weak on both legs.  Denies any injury or twisting movements.  Denies any fever.  Denies any nausea vomiting.  No diarrhea.  Urine is normal.  No headache or dizziness.  Received Neupogen on 21st. ED Course: Blood pressures are stable.  Venous lactate elevated.  Patient is tachycardic.  Rectal temperature 100 F.  Neutropenic with total white count of 700.  Lactic acid 3.5.  Chest x-ray normal.  Urinalysis normal. Patient was found with no acute neurological deficit or cauda equina.  MRI of the thoracic and lumbar spine shows no abscess, it shows L1 disc protrusion which is probably chronic.  No obvious evidence of collections, abnormal marrow signal.  Patient was given IV fluids, multiple rounds of pain medications, vancomycin and cefepime in the emergency room for neutropenic fever. His oncologist were notified by ER.  Review of Systems: As per HPI otherwise 10 point review of systems negative.    Past Medical History:  Diagnosis Date  . Non Hodgkin's lymphoma ([place])   . Non Hodgkin's lymphoma [facility])     Past Surgical History:  Procedure Laterality Date  . ANKLE SURGERY Left    cleaned up   . FRACTURE SURGERY Left    arm  . IR IMAGING GUIDED PORT INSERTION  06/03/2018    . MEDIASTINOTOMY CHAMBERLAIN MCNEIL N/A 05/10/2018   Procedure: Mediastinal mass biopsy ;  Surgeon: Grace Isaac, MD;  Location[facility];  Service: Thoracic;  Laterality: N/A;  . ORIF FINGER / THUMB FRACTURE Left      reports that he has never smoked. He has never used smokeless tobacco. He reports that he does not drink alcohol or use drugs.  No Known Allergies  History reviewed. No pertinent family history.   Prior to Admission medications   Medication Sig Start Date End Date Taking? Authorizing Provider  diphenhydrAMINE (BENADRYL) 25 mg capsule Take 25 mg by mouth every 6 (six) hours as needed for allergies.    Yes [provider]  lidocaine-prilocaine (EMLA) cream Apply 1 application topically as needed. Patient taking differently: Apply 1 application topically as needed (port access).  06/04/18  Yes [name], MD  LORazepam (ATIVAN) 0.5 MG tablet Take 1 tablet (0.5 mg total) by mouth every 6 (six) hours as needed (Nausea or vomiting). 08/30/18  Yes [name], MD  ondansetron (ZOFRAN) 8 MG tablet Take 1 tablet (8 mg total) by mouth 2 (two) times daily as needed. Start on the third day after doxorubicin/cytoxan chemotherapy. 08/30/18  Yes [name], MD  predniSONE (DELTASONE) 20 MG tablet Take 3 tablets (60 mg total) by mouth daily with breakfast. Take daily on days 1-14 of chemotherapy. 08/30/18  Yes [name], MD  procarbazine (MATULANE) 50 MG capsule Take  4 capsules (200 mg total) by mouth daily. Take daily on days 1-7 of each 21 day cycle. Follow low tyramine diet. 09/04/18  Yes [name], MD  prochlorperazine (COMPAZINE) 10 MG tablet Take 1 tablet (10 mg total) by mouth every 6 (six) hours as needed for nausea or vomiting. 07/19/18  Yes [name], MD  Rivaroxaban 15 & 20 MG TBPK Take as directed on package: Start with one 15mg  tablet by mouth twice a day with food. On Day 22, switch to one 20mg  tablet once a  day with food. 09/04/18  Yes Fatima Blank, MD    Physical Exam: Vitals:   09/16/18 0650 09/16/18 0655 09/16/18 0715 09/16/18 0744  BP: (!) 156/93 (!) 156/93 (!) 153/92   Pulse:  (!) 129 (!) 121 (!) 124  Resp: (!) 23 (!) 33 (!) 35 (!) 28  Temp:    99.1 F (37.3 C)  TempSrc:    Oral  SpO2:  99% 98% 100%  Weight:      Height:        Constitutional: NAD, calm, comfortable Vitals:   09/16/18 0650 09/16/18 0655 09/16/18 0715 09/16/18 0744  BP: (!) 156/93 (!) 156/93 (!) 153/92   Pulse:  (!) 129 (!) 121 (!) 124  Resp: (!) 23 (!) 33 (!) 35 (!) 28  Temp:    99.1 F (37.3 C)  TempSrc:    Oral  SpO2:  99% 98% 100%  Weight:      Height:       Eyes: PERRL, lids and conjunctivae normal Anxious. ENMT: Mucous membranes are moist. Posterior pharynx clear of any exudate or lesions.Normal dentition.  Neck: normal, supple, no masses, no thyromegaly Respiratory: clear to auscultation bilaterally, no wheezing, no crackles. Normal respiratory effort. No accessory muscle use.  Cardiovascular: Regular rate and rhythm, no murmurs / rubs / gallops. No extremity edema. 2+ pedal pulses. No carotid bruits.  Tachycardic. Abdomen: no tenderness, no masses palpated. No hepatosplenomegaly. Bowel sounds positive.  Musculoskeletal: no clubbing / cyanosis. No joint deformity upper and lower extremities. Good ROM, no contractures. Normal muscle tone.  Skin: no rashes, lesions, ulcers. No induration Neurologic: CN 2-12 grossly intact. Sensation intact, DTR normal. Strength 5/5 in all 4.  Psychiatric: Normal judgment and insight. Alert and oriented x 3.  Anxious. Right anterior chest wall port that is nontender.  Not accessed at this time.   Labs on Admission: I have personally reviewed following labs and imaging studies  CBC: Recent Labs  Lab 09/11/18 1200 09/15/18 2256  WBC 3.8* 0.7*  NEUTROABS 3.1  --   HGB 11.2* 11.7*  HCT 32.9* 34.6*  MCV 78.1* 80.3  PLT 213 725*   Basic Metabolic  Panel: Recent Labs  Lab 09/11/18 1200 09/15/18 2256  NA 142 135  K 3.4* 3.7  CL 105 99  CO2 30 24  GLUCOSE 96 143*  BUN 13 14  CREATININE 0.91 0.83  CALCIUM 8.7* 9.6   GFR: Estimated Creatinine Clearance: 145.2 mL/min (by C-G formula based on SCr of 0.83 mg/dL). Liver Function Tests: Recent Labs  Lab 09/11/18 1200 09/15/18 2256  AST 10* 22  ALT 24 48*  ALKPHOS 65 71  BILITOT 0.5 1.2  PROT 6.8 8.1  ALBUMIN 3.8 4.5   No results for input(s): LIPASE, AMYLASE in the last 168 hours. No results for input(s): AMMONIA in the last 168 hours. Coagulation Profile: No results for input(s): INR, PROTIME in the last 168 hours. Cardiac Enzymes: No results for  input(s): CKTOTAL, CKMB, CKMBINDEX, TROPONINI in the last 168 hours. BNP (last 3 results) No results for input(s): PROBNP in the last 8760 hours. HbA1C: No results for input(s): HGBA1C in the last 72 hours. CBG: No results for input(s): GLUCAP in the last 168 hours. Lipid Profile: No results for input(s): CHOL, HDL, LDLCALC, TRIG, CHOLHDL, LDLDIRECT in the last 72 hours. Thyroid Function Tests: No results for input(s): TSH, T4TOTAL, FREET4, T3FREE, THYROIDAB in the last 72 hours. Anemia Panel: No results for input(s): VITAMINB12, FOLATE, FERRITIN, TIBC, IRON, RETICCTPCT in the last 72 hours. Urine analysis:    Component Value Date/Time   COLORURINE YELLOW 09/16/2018 0233   APPEARANCEUR CLEAR 09/16/2018 0233   LABSPEC 1.017 09/16/2018 0233   PHURINE 8.0 09/16/2018 0233   GLUCOSEU NEGATIVE 09/16/2018 0233   HGBUR NEGATIVE 09/16/2018 0233   BILIRUBINUR NEGATIVE 09/16/2018 0233   KETONESUR NEGATIVE 09/16/2018 0233   PROTEINUR NEGATIVE 09/16/2018 0233   NITRITE NEGATIVE 09/16/2018 0233   LEUKOCYTESUR NEGATIVE 09/16/2018 0233    Radiological Exams on Admission: Mr Thoracic Spine W Wo Contrast  Result Date: 09/16/2018 CLINICAL DATA:  23 y/o M; sudden onset severe lower back pain and bilateral leg weakness. History of  non-Hodgkin's lymphoma. EXAM: MRI THORACIC AND LUMBAR SPINE WITHOUT AND WITH CONTRAST TECHNIQUE: Multiplanar and multiecho pulse sequences of the thoracic and lumbar spine were obtained without and with intravenous contrast. CONTRAST:  8 cc Gadavist COMPARISON:  09/04/2018 CT chest FINDINGS: MRI THORACIC SPINE FINDINGS Motion degradation of axial sequences. Alignment:  Physiologic. Vertebrae: No fracture, evidence of discitis, or bone lesion. No abnormal enhancement. Cord:  Normal signal and morphology.  No abnormal enhancement. Paraspinal and other soft tissues: Negative. Disc levels: No significant disc displacement, foraminal stenosis, or canal stenosis. MRI LUMBAR SPINE FINDINGS Segmentation:  Standard. Alignment:  Physiologic. Vertebrae: No fracture, evidence of discitis, or bone lesion. No abnormal enhancement. Conus medullaris: Extends to the L1 level and appears normal. No abnormal enhancement. Paraspinal and other soft tissues: Right kidney lower pole 16 mm cyst. Disc levels: L1-2: No significant disc displacement, foraminal stenosis, or canal stenosis. L2-3: No significant disc displacement, foraminal stenosis, or canal stenosis. L3-4: No significant disc displacement, foraminal stenosis, or canal stenosis. L4-5: No significant disc displacement, foraminal stenosis, or canal stenosis. L5-S1: Central and left subarticular disc protrusion with annular fissure contacting the descending S1 nerve roots in the lateral recesses bilaterally. Mild bilateral foraminal stenosis and spinal canal stenosis. IMPRESSION: MRI thoracic spine: 1. Motion degradation of axial sequences. 2. No abnormal osseous or cord signal abnormality. 3. No significant disc displacement, foraminal stenosis, or canal stenosis. MRI lumbar spine: 1. No abnormal osseous or cord signal abnormality. 2. L5-S1 central and left subarticular disc protrusion with annular fissure contacting the descending S1 nerve roots in the lateral recesses as well  as resulting in mild bilateral foraminal stenosis and spinal canal stenosis. Electronically Signed   By: Kristine Garbe M.D.   On: 09/16/2018 05:40   Mr Lumbar Spine W Wo Contrast  Result Date: 09/16/2018 CLINICAL DATA:  23 y/o M; sudden onset severe lower back pain and bilateral leg weakness. History of non-Hodgkin's lymphoma. EXAM: MRI THORACIC AND LUMBAR SPINE WITHOUT AND WITH CONTRAST TECHNIQUE: Multiplanar and multiecho pulse sequences of the thoracic and lumbar spine were obtained without and with intravenous contrast. CONTRAST:  8 cc Gadavist COMPARISON:  09/04/2018 CT chest FINDINGS: MRI THORACIC SPINE FINDINGS Motion degradation of axial sequences. Alignment:  Physiologic. Vertebrae: No fracture, evidence of discitis, or bone lesion. No  abnormal enhancement. Cord:  Normal signal and morphology.  No abnormal enhancement. Paraspinal and other soft tissues: Negative. Disc levels: No significant disc displacement, foraminal stenosis, or canal stenosis. MRI LUMBAR SPINE FINDINGS Segmentation:  Standard. Alignment:  Physiologic. Vertebrae: No fracture, evidence of discitis, or bone lesion. No abnormal enhancement. Conus medullaris: Extends to the L1 level and appears normal. No abnormal enhancement. Paraspinal and other soft tissues: Right kidney lower pole 16 mm cyst. Disc levels: L1-2: No significant disc displacement, foraminal stenosis, or canal stenosis. L2-3: No significant disc displacement, foraminal stenosis, or canal stenosis. L3-4: No significant disc displacement, foraminal stenosis, or canal stenosis. L4-5: No significant disc displacement, foraminal stenosis, or canal stenosis. L5-S1: Central and left subarticular disc protrusion with annular fissure contacting the descending S1 nerve roots in the lateral recesses bilaterally. Mild bilateral foraminal stenosis and spinal canal stenosis. IMPRESSION: MRI thoracic spine: 1. Motion degradation of axial sequences. 2. No abnormal osseous or  cord signal abnormality. 3. No significant disc displacement, foraminal stenosis, or canal stenosis. MRI lumbar spine: 1. No abnormal osseous or cord signal abnormality. 2. L5-S1 central and left subarticular disc protrusion with annular fissure contacting the descending S1 nerve roots in the lateral recesses as well as resulting in mild bilateral foraminal stenosis and spinal canal stenosis. Electronically Signed   By: Kristine Garbe M.D.   On: 09/16/2018 05:40   Dg Chest Portable 1 View  Result Date: 09/16/2018 CLINICAL DATA:  History of non-Hodgkin's lymphoma with back pain, initial encounter EXAM: PORTABLE CHEST 1 VIEW COMPARISON:  09/04/2018 FINDINGS: Cardiac shadow is stable. Right chest wall port is again identified and stable. Lungs are well aerated bilaterally. No focal infiltrate or sizable effusion is seen. Previously seen lymphadenopathy is stable given some slight variation in patient positioning. No bony abnormality is noted. IMPRESSION: No acute abnormality noted. Stable changes of lymphadenopathy consistent with the given clinical history. Electronically Signed   By: Inez Catalina M.D.   On: 09/16/2018 07:03    EKG: Independently reviewed.  Sinus tachycardia, fluttering artifacts.  No ST-T wave changes.  Assessment/Plan Principal Problem:   Neutropenia with fever (HCC) Active Problems:   Hodgkin lymphoma ([place])   Port-A-Cath in place   Back pain   Pulmonary embolism ([place])     1.  Neutropenic fever: Agree with admission given severity of symptoms.  Continue IV fluids, continue vancomycin and cefepime pending clinical improvement or final cultures. No localizing source of infection. His WBC count is 0.7.  Received Neupogen on 21st.  He is 2 weeks out of chemotherapy. Will be seen by oncology, will defer to oncology to give any Neupogen if beneficial.  2.  Severe acute onset back pain: No evidence of acute back infection or nerve compression.  Symptomatic treatment.   Local pain medications.  Adequate opiates and muscle relaxants.  Ambulate and advance activities.  3.  Pulmonary embolism: Started on Xarelto on 09/05/2018, continue Xarelto 15 mg twice a day for next 10 days before converting to 20 mg once a day.  4.  Hodgkin's lymphoma: Currently on chemotherapy.  Followed by oncology.    DVT prophylaxis: On Xarelto. Code Status: Full code. Family Communication: No family at bedside. Disposition Plan: Home when is stable.  Anticipate 48 to 72 hours. Consults called: Oncology called by ER. Admission status: Inpatient.   Barb Merino MD [facility] Pager [phone]  If 7PM-7AM, please contact night-coverage [email or web address] Password [facility]  09/16/2018, 7:59 AM

## 2018-09-16 NOTE — ED Notes (Signed)
Pt had food delivered to ED via delivery app. Informed pt he is not allowed to eat until scans are done. Food remains at nurses station.

## 2018-09-16 NOTE — ED Provider Notes (Addendum)
12:30 AM  Assumed care.  Patient is a 23 y.o. male with recent diagnosis of non-Hodgkin's lymphoma on chemotherapy, last infusion last week, pulmonary embolus now on Xarelto who presents to the emergency department with sudden onset lower back pain that started tonight.  States he feels like both legs are weak.  No numbness or tingling.  No bowel or bladder incontinence but feels now he cannot empty his bladder.  No fever.  Had a biopsy from his back in October.  No other surgeries, injections or procedures done to the back.  No injury that he can recall.  States that on the 21st he received a Neupogen injection.  He is wondering now if this was what is causing his pain.  On my examination, patient has no numbness.  Extremities are warm and well-perfused.  He has some lower lumbar spinal tenderness that is reproducible with palpation without redness, swelling, ecchymosis or warmth.  No midline step-off or deformity.  Negative straight leg raise.  No noted weakness in bilateral lower extremities.  2+ deep tendon reflexes bilaterally.  No clonus.  No saddle anesthesia.  MRI of the thoracic and lumbar spine ordered with and without contrast.  Urinalysis pending.  Will also obtain post void residual given he is now reporting urinary retention.  Patient still complaining of pain.  Does not appear significantly uncomfortable at this time.  Will give third round of Dilaudid.  1:00 AM  Pt still c/o significant pain after Dilaudid.  Would like to hold on NSAIDs at this time given we are not sure if patient is having epidural hematoma.  Will give dose of IV Valium.  Patient may also benefit from IV ketamine.  Please forward to residual normal.  Able to urinate 300 mL.  2:30 AM  Pt walking around the emergency department without difficulty.  Requesting something else for pain.  Will try IV ketamine.  He does not appear to be significantly uncomfortable at this time.  Patient ordered himself takeout food.  He has be  instructed that he needs to be NPO until we have his MRI results as we need to rule out any neurosurgical process today.  6:20 AM  Pt's back pain has been difficult to control despite several rounds of narcotic pain medication.  His MRI show L5-S1 central and left subarticular disc protrusion with annular fissure contacting the descending S1 nerve roots in the lateral recesses as well as mild bilateral foraminal stenosis and spinal canal stenosis.  He has no focal neurologic deficits on my exam.  Normal strength, sensation, reflexes and able to ambulate.  His pain is reproducible with palpation of his lower back.  No signs of cellulitis or abscess over the skin.  His abdominal exam is benign today.  No tenderness on palpation.  On reevaluation his skin is very warm to touch.  Rectal temperature is 100.2.  He is now tachycardic and tachypneic.  Concern for possible sepsis and patient is neutropenic.  Will place him on neutropenic precautions.  Denies any cough, sore throat, rash.  No headache, neck pain or neck stiffness.  No urinary symptoms and urine does not appear infected today.  Patient does have a port.  Discussed with Dr. Alen Blew with oncology.  Agrees with obtaining cultures and giving vancomycin and cefepime.  Recommends medicine admission.  Does not feel the patient needs to be transferred back to [place] long.  They will follow patient in consult.    6:35 AM Discussed patient's case with hospitalist, Dr.  Niu.  I have recommended admission and patient (and family if present) agree with this plan. Admitting physician will place admission orders.   I reviewed all nursing notes, vitals, pertinent previous records, EKGs, lab and urine results, imaging (as available).     Date: 09/16/2018 6:39 AM  Rate: 124  Rhythm: sinus tachycardia  QRS Axis: normal  Intervals: normal  ST/T Wave abnormalities: normal  Conduction Disutrbances: none  Narrative Interpretation: sinus tachycardia     CRITICAL  CARE Performed by: Pryor Curia   Total critical care time: 75 minutes  Critical care time was exclusive of separately billable procedures and treating other patients.  Critical care was necessary to treat or prevent imminent or life-threatening deterioration.  Critical care was time spent personally by me on the following activities: development of treatment plan with patient and/or surrogate as well as nursing, discussions with consultants, evaluation of patient's response to treatment, examination of patient, obtaining history from patient or surrogate, ordering and performing treatments and interventions, ordering and review of laboratory studies, ordering and review of radiographic studies, pulse oximetry and re-evaluation of patient's condition.    , Delice Bison, DO 09/16/18 [phone]

## 2018-09-16 NOTE — ED Notes (Signed)
Pt voided 300 CC urine with bladder scan following with no residual detected.

## 2018-09-16 NOTE — Care Management (Signed)
This is a no charge note  Pending admission per Dr. Leonides Schanz  23 year old male with past medical history of recently diagnosed non-Hodgkin lymphoma on chemotherapy (last chemo was last week), PE on Xarelto, anxiety, who presents with severe back pain.  MRI of T-spine and lumbar spine did not show abscess or cord compression.  Initially patient did not have fever, but later on developed fever of 102 in ED.  Patient has pancytopenia (WBC 0.7, hemoglobin 11.7, platelet 109), negative urinalysis, electrolytes renal function okay.  Patient was started with vancomycin and cefepime for neutropenic fever. ED physician consulted oncology, Dr. Russella Dar.   Ivor Costa, MD  [facility]   If 7PM-7AM, please contact night-coverage [email or web address] Password [facility] 09/16/2018, 6:34 AM

## 2018-09-17 LAB — CBC WITH DIFFERENTIAL/PLATELET
Abs Immature Granulocytes: 5.03 10*3/uL — ABNORMAL HIGH (ref 0.00–0.07)
Abs Immature Granulocytes: 1.68 10*3/uL — ABNORMAL HIGH (ref 0.00–0.07)
Basophils Absolute: 0.1 10*3/uL (ref 0.0–0.1)
Basophils Absolute: 0.1 10*3/uL (ref 0.0–0.1)
Basophils Relative: 1 %
Basophils Relative: 1 %
EOS ABS: 0 10*3/uL (ref 0.0–0.5)
EOS PCT: 0 %
Eosinophils Absolute: 0 10*3/uL (ref 0.0–0.5)
Eosinophils Relative: 0 %
HCT: 32 % — ABNORMAL LOW (ref 39.0–52.0)
HCT: 29.9 % — ABNORMAL LOW (ref 39.0–52.0)
Hemoglobin: 10.8 g/dL — ABNORMAL LOW (ref 13.0–17.0)
Hemoglobin: 9.9 g/dL — ABNORMAL LOW (ref 13.0–17.0)
Immature Granulocytes: 63 %
Immature Granulocytes: 15 %
Lymphocytes Relative: 4 %
Lymphocytes Relative: 3 %
Lymphs Abs: 0.3 10*3/uL — ABNORMAL LOW (ref 0.7–4.0)
Lymphs Abs: 0.4 10*3/uL — ABNORMAL LOW (ref 0.7–4.0)
MCH: 26.3 pg (ref 26.0–34.0)
MCH: 26.1 pg (ref 26.0–34.0)
MCHC: 33.8 g/dL (ref 30.0–36.0)
MCHC: 33.1 g/dL (ref 30.0–36.0)
MCV: 77.9 fL — ABNORMAL LOW (ref 80.0–100.0)
MCV: 78.7 fL — ABNORMAL LOW (ref 80.0–100.0)
Monocytes Absolute: 1.1 10*3/uL — ABNORMAL HIGH (ref 0.1–1.0)
Monocytes Absolute: 1.2 10*3/uL — ABNORMAL HIGH (ref 0.1–1.0)
Monocytes Relative: 14 %
Monocytes Relative: 11 %
NEUTROS PCT: 70 %
Neutro Abs: 1.5 10*3/uL — ABNORMAL LOW (ref 1.7–7.7)
Neutro Abs: 7.6 10*3/uL (ref 1.7–7.7)
Neutrophils Relative %: 18 %
Platelets: 70 10*3/uL — ABNORMAL LOW (ref 150–400)
Platelets: 66 10*3/uL — ABNORMAL LOW (ref 150–400)
RBC: 4.11 MIL/uL — ABNORMAL LOW (ref 4.22–5.81)
RBC: 3.8 MIL/uL — ABNORMAL LOW (ref 4.22–5.81)
RDW: 15.7 % — AB (ref 11.5–15.5)
RDW: 15.8 % — ABNORMAL HIGH (ref 11.5–15.5)
WBC Morphology: INCREASED
WBC Morphology: INCREASED
WBC: 8 10*3/uL (ref 4.0–10.5)
WBC: 11 10*3/uL — ABNORMAL HIGH (ref 4.0–10.5)
nRBC: 0.5 % — ABNORMAL HIGH (ref 0.0–0.2)
nRBC: 0.6 % — ABNORMAL HIGH (ref 0.0–0.2)

## 2018-09-17 LAB — URINE CULTURE: Culture: NO GROWTH

## 2018-09-17 LAB — BASIC METABOLIC PANEL
Anion gap: 8 (ref 5–15)
BUN: 10 mg/dL (ref 6–20)
CALCIUM: 9 mg/dL (ref 8.9–10.3)
CO2: 24 mmol/L (ref 22–32)
CREATININE: 0.91 mg/dL (ref 0.61–1.24)
Chloride: 100 mmol/L (ref 98–111)
GFR calc Af Amer: >60 mL/min (ref >60)
GFR calc non Af Amer: >60 mL/min (ref >60)
Glucose, Bld: 130 mg/dL — ABNORMAL HIGH (ref 70–99)
Potassium: 4.1 mmol/L (ref 3.5–5.1)
Sodium: 132 mmol/L — ABNORMAL LOW (ref 135–145)

## 2018-09-17 LAB — MRSA PCR SCREENING: MRSA by PCR: NEGATIVE

## 2018-09-17 LAB — PATHOLOGIST SMEAR REVIEW

## 2018-09-17 MED ORDER — HYDROCODONE-ACETAMINOPHEN 7.5-325 MG PO TABS
1.0000 | ORAL_TABLET | Freq: Four times a day (QID) | ORAL | Status: DC | PRN
  Administered 2018-09-18 – 2018-09-20 (×9): 1 via ORAL
  Filled 2018-09-17 (×10): qty 1

## 2018-09-17 MED ORDER — MORPHINE SULFATE (PF) 2 MG/ML IV SOLN
INTRAVENOUS | Status: AC
  Filled 2018-09-17: qty 1

## 2018-09-17 MED ORDER — SODIUM CHLORIDE 0.9 % IV SOLN
INTRAVENOUS | Status: AC
  Administered 2018-09-17 (×2): via INTRAVENOUS

## 2018-09-17 MED ORDER — HYDROCODONE-ACETAMINOPHEN 5-325 MG PO TABS
1.0000 | ORAL_TABLET | Freq: Four times a day (QID) | ORAL | Status: DC | PRN
  Administered 2018-09-17: 1 via ORAL
  Filled 2018-09-17: qty 1

## 2018-09-17 MED ORDER — MORPHINE SULFATE (PF) 2 MG/ML IV SOLN
1.0000 mg | INTRAVENOUS | Status: DC | PRN
  Administered 2018-09-17 – 2018-09-19 (×9): 1 mg via INTRAVENOUS
  Filled 2018-09-17 (×9): qty 1

## 2018-09-17 NOTE — Progress Notes (Signed)
PROGRESS NOTE                                                                                                                                                                                                             Patient Demographics:    David Lam, is a 23 y.o. male, DOB - 24-Jun-1996, XVQ:[id number]  Admit date - 09/15/2018   Admitting Physician Ivor Costa, MD  Outpatient Primary MD for the patient is Patient, No Pcp Per  LOS - 1  Chief Complaint  Patient presents with  . Back Pain       Brief Narrative   David Lam is a 23 y.o. male with medical history significant of Hodgkin's lymphoma currently on chemotherapy, pulmonary embolism on Xarelto who presented to the emergency room last night with 1 day of severe, acute onset low back pain with generalized weakness and weakness of both legs, he was diagnosed with Neutropenic Fever and admitted.   Subjective:    David Lam today has, No headache, No chest pain, No abdominal pain - No Nausea, No new weakness tingling or numbness, No Cough - SOB.     Assessment  & Plan :     1.  Neutropenic fever sepsis in a patient with history of lymphoma undergoing chemotherapy under the care of Dr. Irene Limbo.  No clear source, MRI T and L-spine noted which was unremarkable, chest x-ray UA unremarkable, blood cultures negative so far, sepsis pathophysiology has resolved, appears nontoxic.  Right Port-A-Cath site appears clean. Continue IVF and PO steroids at home dose.  Currently on vancomycin and cefepime which I will continue.  Obtain MRSA PCR in the nares.  If negative will stop vancomycin.  Case also discussed with oncology who agrees with the plan and will see the patient shortly.  He had back and joint pains which seems to be fever related he is much improved this morning no focal deficits or weakness.  2.  Follow-up with neutropenia upon admission.  Neutropenia thankfully has resolved, cell counts are much improved, he did get  Neupogen last treatment with oncology.  3.  History of PE.  On Xarelto continue.    Family Communication  :  mother  Code Status :  Full  Disposition Plan  :  Med  Consults  :  Oncology  Procedures  :   MRI T & L Spine - Non acute, mild L5-S1 disc protrusion.   DVT Prophylaxis  :  Xaralto  Lab Results  Component Value  Date   PLT 70 (L) 09/17/2018    Diet :  Diet Order            Diet regular Room service appropriate? Yes; Fluid consistency: Thin  Diet effective now               Inpatient Medications Scheduled Meds: . predniSONE  60 mg Oral Daily  . rivaroxaban  15 mg Oral BID WC   Followed by  . [START ON 09/26/2018] rivaroxaban  20 mg Oral Q supper   Continuous Infusions: . sodium chloride 100 mL/hr at 09/17/18 0922  . ceFEPime (MAXIPIME) IV 2 g (09/17/18 0821)  . vancomycin 1,250 mg (09/17/18 0538)   PRN Meds:.acetaminophen **OR** [DISCONTINUED] acetaminophen, HYDROcodone-acetaminophen, prochlorperazine  Antibiotics  :   Anti-infectives (From admission, onward)   Start     Dose/Rate Route Frequency Ordered Stop   09/16/18 1500  ceFEPIme (MAXIPIME) 2 g in sodium chloride 0.9 % 100 mL IVPB     2 g 200 mL/hr over 30 Minutes Intravenous Every 8 hours 09/16/18 1230     09/16/18 1400  vancomycin (VANCOCIN) 1,250 mg in sodium chloride 0.9 % 250 mL IVPB     1,250 mg 166.7 mL/hr over 90 Minutes Intravenous Every 8 hours 09/16/18 1257     09/16/18 0615  vancomycin (VANCOCIN) IVPB 1000 mg/200 mL premix     1,000 mg 200 mL/hr over 60 Minutes Intravenous  Once 09/16/18 0613 09/16/18 0842   09/16/18 0615  ceFEPIme (MAXIPIME) 2 g in sodium chloride 0.9 % 100 mL IVPB     2 g 200 mL/hr over 30 Minutes Intravenous  Once 09/16/18 0613 09/16/18 0722          Objective:   Vitals:   09/16/18 1430 09/16/18 1613 09/17/18 0923 09/17/18 0925  BP: (!) 148/97 (!) 148/90    Pulse: (!) 105 (!) 103 (!) 118 (!) 124  Resp: (!) 23 (!) 29 (!) 26 17  Temp:      TempSrc:        SpO2: 99% 99% 94% 94%  Weight:      Height:        Wt Readings from Last 3 Encounters:  09/15/18 84.4 kg  09/11/18 93.4 kg  09/03/18 88.9 kg     Intake/Output Summary (Last 24 hours) at 09/17/2018 1053 Last data filed at 09/17/2018 0558 Gross per 24 hour  Intake 2000 ml  Output 2950 ml  Net -950 ml     Physical Exam  Awake Alert, Oriented X 3, No new F.N deficits, Normal affect [place].AT,PERRAL Supple Neck,No JVD, No cervical lymphadenopathy appriciated.  Symmetrical Chest wall movement, Good air movement bilaterally, CTAB, right chest wall Port-A-Cath site appears clean. RRR,No Gallops,Rubs or new Murmurs, No Parasternal Heave +ve B.Sounds, Abd Soft, No tenderness, No organomegaly appriciated, No rebound - guarding or rigidity. No Cyanosis, Clubbing or edema, No new Rash or bruise      Data Review:    CBC Recent Labs  Lab 09/11/18 1200 09/15/18 2256 09/17/18 0427  WBC 3.8* 0.7* 8.0  HGB 11.2* 11.7* 10.8*  HCT 32.9* 34.6* 32.0*  PLT 213 109* 70*  MCV 78.1* 80.3 77.9*  MCH 26.6 27.1 26.3  MCHC 34.0 33.8 33.8  RDW 15.9* 15.5 15.7*  LYMPHSABS 0.5*  --  0.3*  MONOABS 0.0*  --  1.1*  EOSABS 0.0  --  0.0  BASOSABS 0.0  --  0.1    Chemistries  Recent Labs  Lab 09/11/18 1200 09/15/18 2256  09/17/18 0427  NA 142 135 132*  K 3.4* 3.7 4.1  CL 105 99 100  CO2 30 24 24   GLUCOSE 96 143* 130*  BUN 13 14 10   CREATININE 0.91 0.83 0.91  CALCIUM 8.7* 9.6 9.0  AST 10* 22  --   ALT 24 48*  --   ALKPHOS 65 71  --   BILITOT 0.5 1.2  --    ------------------------------------------------------------------------------------------------------------------ No results for input(s): CHOL, HDL, LDLCALC, TRIG, CHOLHDL, LDLDIRECT in the last 72 hours.  No results found for: HGBA1C ------------------------------------------------------------------------------------------------------------------ No results for input(s): TSH, T4TOTAL, T3FREE, THYROIDAB in the last 72  hours.  Invalid input(s): FREET3 ------------------------------------------------------------------------------------------------------------------ No results for input(s): VITAMINB12, FOLATE, FERRITIN, TIBC, IRON, RETICCTPCT in the last 72 hours.  Coagulation profile No results for input(s): INR, PROTIME in the last 168 hours.  No results for input(s): DDIMER in the last 72 hours.  Cardiac Enzymes No results for input(s): CKMB, TROPONINI, MYOGLOBIN in the last 168 hours.  Invalid input(s): CK ------------------------------------------------------------------------------------------------------------------    Component Value Date/Time   BNP 14.2 05/07/2018 1723    Micro Results Recent Results (from the past 240 hour(s))  Urine Culture     Status: None   Collection Time: 09/16/18  6:35 AM  Result Value Ref Range Status   Specimen Description URINE, RANDOM  Final   Special Requests NONE  Final   Culture   Final    NO GROWTH Performed at [facility], [address], [place], [place] [address]    Report Status 09/17/2018 FINAL  Final    Radiology Reports Dg Chest 2 View  Result Date: 09/04/2018 CLINICAL DATA:  Chest pain, history of Hodgkin's lymphoma EXAM: CHEST - 2 VIEW COMPARISON:  08/16/2018 PET-CT FINDINGS: Cardiac shadow is within normal limits. Increased soft tissue density is along the right hilum and right paratracheal region as well as the superior mediastinum on the left consistent with the previously seen lymph nodes. Lungs are clear. No bony abnormality is noted. Right chest wall port is seen. IMPRESSION: Changes consistent with mediastinal adenopathy similar to that seen on prior PET-CT. No acute abnormality is noted. Electronically Signed   By: Inez Catalina M.D.   On: 09/04/2018 02:32   Ct Angio Chest Pe W And/or Wo Contrast  Result Date: 09/04/2018 CLINICAL DATA:  Chest pain. EXAM: CT ANGIOGRAPHY CHEST WITH CONTRAST TECHNIQUE: Multidetector CT imaging of  the chest was performed using the standard protocol during bolus administration of intravenous contrast. Multiplanar CT image reconstructions and MIPs were obtained to evaluate the vascular anatomy. CONTRAST:  30mL ISOVUE-370 IOPAMIDOL (ISOVUE-370) INJECTION 76% COMPARISON:  08/16/2018 PET-CT FINDINGS: Cardiovascular: 2 branching central pulmonary filling defects seen in subsegmental right upper lobe branches. No additional acute pulmonary embolism is seen. Normal heart size. No pericardial effusion. Porta catheter on the right with tip at the upper right atrium. 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 Lungs/Pleura: Generalized airway thickening and mosaic lung attenuation. This is likely accentuated by expiratory imaging. Similar findings were seen on 2020 PET-CT, although better inflated at that time. Even though there is acute pulmonary embolism this is most likely due to small airways disease given the extent greater than visible PE and the airway thickening. Upper Abdomen: Negative Musculoskeletal: Negative Critical Value/emergent results were called by telephone at the time of interpretation on 09/04/2018 at 6:19 am to Dr. Addison Lank , who verbally acknowledged these results. Review of the MIP images confirms the above findings. IMPRESSION: 1. Positive  for 2 acute subsegmental pulmonary emboli in the right upper lobe. 2. Heterogeneous aeration diffusely, likely small airways disease with air trapping given there is also generalized bronchial wall thickening. 3. 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 Electronically Signed   By: Monte Fantasia M.D.   On: 09/04/2018 06:20   Mr Thoracic Spine W Wo Contrast  Result Date: 09/16/2018 CLINICAL DATA:  23 y/o M; sudden onset severe lower back pain and bilateral leg weakness. History of non-Hodgkin's lymphoma. EXAM: MRI THORACIC AND LUMBAR SPINE WITHOUT AND WITH CONTRAST TECHNIQUE: Multiplanar and multiecho pulse  sequences of the thoracic and lumbar spine were obtained without and with intravenous contrast. CONTRAST:  8 cc Gadavist COMPARISON:  09/04/2018 CT chest FINDINGS: MRI THORACIC SPINE FINDINGS Motion degradation of axial sequences. Alignment:  Physiologic. Vertebrae: No fracture, evidence of discitis, or bone lesion. No abnormal enhancement. Cord:  Normal signal and morphology.  No abnormal enhancement. Paraspinal and other soft tissues: Negative. Disc levels: No significant disc displacement, foraminal stenosis, or canal stenosis. MRI LUMBAR SPINE FINDINGS Segmentation:  Standard. Alignment:  Physiologic. Vertebrae: No fracture, evidence of discitis, or bone lesion. No abnormal enhancement. Conus medullaris: Extends to the L1 level and appears normal. No abnormal enhancement. Paraspinal and other soft tissues: Right kidney lower pole 16 mm cyst. Disc levels: L1-2: No significant disc displacement, foraminal stenosis, or canal stenosis. L2-3: No significant disc displacement, foraminal stenosis, or canal stenosis. L3-4: No significant disc displacement, foraminal stenosis, or canal stenosis. L4-5: No significant disc displacement, foraminal stenosis, or canal stenosis. L5-S1: Central and left subarticular disc protrusion with annular fissure contacting the descending S1 nerve roots in the lateral recesses bilaterally. Mild bilateral foraminal stenosis and spinal canal stenosis. IMPRESSION: MRI thoracic spine: 1. Motion degradation of axial sequences. 2. No abnormal osseous or cord signal abnormality. 3. No significant disc displacement, foraminal stenosis, or canal stenosis. MRI lumbar spine: 1. No abnormal osseous or cord signal abnormality. 2. L5-S1 central and left subarticular disc protrusion with annular fissure contacting the descending S1 nerve roots in the lateral recesses as well as resulting in mild bilateral foraminal stenosis and spinal canal stenosis. Electronically Signed   By: Kristine Garbe  M.D.   On: 09/16/2018 05:40   Mr Lumbar Spine W Wo Contrast  Result Date: 09/16/2018 CLINICAL DATA:  23 y/o M; sudden onset severe lower back pain and bilateral leg weakness. History of non-Hodgkin's lymphoma. EXAM: MRI THORACIC AND LUMBAR SPINE WITHOUT AND WITH CONTRAST TECHNIQUE: Multiplanar and multiecho pulse sequences of the thoracic and lumbar spine were obtained without and with intravenous contrast. CONTRAST:  8 cc Gadavist COMPARISON:  09/04/2018 CT chest FINDINGS: MRI THORACIC SPINE FINDINGS Motion degradation of axial sequences. Alignment:  Physiologic. Vertebrae: No fracture, evidence of discitis, or bone lesion. No abnormal enhancement. Cord:  Normal signal and morphology.  No abnormal enhancement. Paraspinal and other soft tissues: Negative. Disc levels: No significant disc displacement, foraminal stenosis, or canal stenosis. MRI LUMBAR SPINE FINDINGS Segmentation:  Standard. Alignment:  Physiologic. Vertebrae: No fracture, evidence of discitis, or bone lesion. No abnormal enhancement. Conus medullaris: Extends to the L1 level and appears normal. No abnormal enhancement. Paraspinal and other soft tissues: Right kidney lower pole 16 mm cyst. Disc levels: L1-2: No significant disc displacement, foraminal stenosis, or canal stenosis. L2-3: No significant disc displacement, foraminal stenosis, or canal stenosis. L3-4: No significant disc displacement, foraminal stenosis, or canal stenosis. L4-5: No significant disc displacement, foraminal stenosis, or canal stenosis. L5-S1: Central and left  subarticular disc protrusion with annular fissure contacting the descending S1 nerve roots in the lateral recesses bilaterally. Mild bilateral foraminal stenosis and spinal canal stenosis. IMPRESSION: MRI thoracic spine: 1. Motion degradation of axial sequences. 2. No abnormal osseous or cord signal abnormality. 3. No significant disc displacement, foraminal stenosis, or canal stenosis. MRI lumbar spine: 1. No  abnormal osseous or cord signal abnormality. 2. L5-S1 central and left subarticular disc protrusion with annular fissure contacting the descending S1 nerve roots in the lateral recesses as well as resulting in mild bilateral foraminal stenosis and spinal canal stenosis. Electronically Signed   By: Kristine Garbe M.D.   On: 09/16/2018 05:40   Dg Chest Portable 1 View  Result Date: 09/16/2018 CLINICAL DATA:  History of non-Hodgkin's lymphoma with back pain, initial encounter EXAM: PORTABLE CHEST 1 VIEW COMPARISON:  09/04/2018 FINDINGS: Cardiac shadow is stable. Right chest wall port is again identified and stable. Lungs are well aerated bilaterally. No focal infiltrate or sizable effusion is seen. Previously seen lymphadenopathy is stable given some slight variation in patient positioning. No bony abnormality is noted. IMPRESSION: No acute abnormality noted. Stable changes of lymphadenopathy consistent with the given clinical history. Electronically Signed   By: Inez Catalina M.D.   On: 09/16/2018 07:03   Vas [place] Lower Extremity Venous (dvt)  Result Date: 09/12/2018  Lower Venous Study Indications: Pulmonary embolism.  Performing Technologist: Abram Sander RVS  Examination Guidelines: A complete evaluation includes B-mode imaging, spectral Doppler, color Doppler, and power Doppler as needed of all accessible portions of each vessel. Bilateral testing is considered an integral part of a complete examination. Limited examinations for reoccurring indications may be performed as noted.  Right Venous Findings: +---------+---------------+---------+-----------+----------+-------+          CompressibilityPhasicitySpontaneityPropertiesSummary +---------+---------------+---------+-----------+----------+-------+ CFV      Full           Yes      Yes                          +---------+---------------+---------+-----------+----------+-------+ SFJ      Full                                                  +---------+---------------+---------+-----------+----------+-------+ FV Prox  Full                                                 +---------+---------------+---------+-----------+----------+-------+ FV Mid   Full                                                 +---------+---------------+---------+-----------+----------+-------+ FV DistalFull                                                 +---------+---------------+---------+-----------+----------+-------+ PFV      Full                                                 +---------+---------------+---------+-----------+----------+-------+  POP      Full           Yes      Yes                          +---------+---------------+---------+-----------+----------+-------+ PTV      Full                                                 +---------+---------------+---------+-----------+----------+-------+ PERO     Full                                                 +---------+---------------+---------+-----------+----------+-------+  Left Venous Findings: +---------+---------------+---------+-----------+----------+-------+          CompressibilityPhasicitySpontaneityPropertiesSummary +---------+---------------+---------+-----------+----------+-------+ CFV      Full           Yes      Yes                          +---------+---------------+---------+-----------+----------+-------+ SFJ      Full                                                 +---------+---------------+---------+-----------+----------+-------+ FV Prox  Full                                                 +---------+---------------+---------+-----------+----------+-------+ FV Mid   Full                                                 +---------+---------------+---------+-----------+----------+-------+ FV DistalFull                                                  +---------+---------------+---------+-----------+----------+-------+ PFV      Full                                                 +---------+---------------+---------+-----------+----------+-------+ POP      Full           Yes      Yes                          +---------+---------------+---------+-----------+----------+-------+ PTV      Full                                                 +---------+---------------+---------+-----------+----------+-------+  PERO     Full                                                 +---------+---------------+---------+-----------+----------+-------+    Summary: Right: There is no evidence of deep vein thrombosis in the lower extremity. No cystic structure found in the popliteal fossa. Left: There is no evidence of deep vein thrombosis in the lower extremity. No cystic structure found in the popliteal fossa.  *See table(s) above for measurements and observations. Electronically signed by Harold Barban MD on 09/12/2018 at 4:09:45 PM.    Final     Time Spent in minutes  30   Lala Lund M.D on 09/17/2018 at 10:53 AM  To page go to [email or web address] - password [facility]

## 2018-09-18 ENCOUNTER — Inpatient Hospital Stay: Payer: BLUE CROSS/BLUE SHIELD

## 2018-09-18 ENCOUNTER — Other Ambulatory Visit: Payer: BLUE CROSS/BLUE SHIELD

## 2018-09-18 DIAGNOSIS — M549 Dorsalgia, unspecified: Secondary | ICD-10-CM

## 2018-09-18 DIAGNOSIS — C8190 Hodgkin lymphoma, unspecified, unspecified site: Secondary | ICD-10-CM

## 2018-09-18 DIAGNOSIS — I2782 Chronic pulmonary embolism: Secondary | ICD-10-CM

## 2018-09-18 LAB — CBC WITH DIFFERENTIAL/PLATELET
Abs Immature Granulocytes: 3.2 10*3/uL — ABNORMAL HIGH (ref 0.00–0.07)
Band Neutrophils: 33 %
Basophils Absolute: 0 10*3/uL (ref 0.0–0.1)
Basophils Relative: 0 %
Eosinophils Absolute: 0.4 10*3/uL (ref 0.0–0.5)
Eosinophils Relative: 1 %
HCT: 28.6 % — ABNORMAL LOW (ref 39.0–52.0)
HEMOGLOBIN: 9.8 g/dL — AB (ref 13.0–17.0)
LYMPHS ABS: 2.5 10*3/uL (ref 0.7–4.0)
Lymphocytes Relative: 7 %
MCH: 26.5 pg (ref 26.0–34.0)
MCHC: 34.3 g/dL (ref 30.0–36.0)
MCV: 77.3 fL — ABNORMAL LOW (ref 80.0–100.0)
MONOS PCT: 16 %
Metamyelocytes Relative: 7 %
Monocytes Absolute: 5.7 10*3/uL — ABNORMAL HIGH (ref 0.1–1.0)
Myelocytes: 2 %
Neutro Abs: 24.1 10*3/uL — ABNORMAL HIGH (ref 1.7–7.7)
Neutrophils Relative %: 34 %
Platelets: 77 10*3/uL — ABNORMAL LOW (ref 150–400)
RBC: 3.7 MIL/uL — ABNORMAL LOW (ref 4.22–5.81)
RDW: 16.1 % — ABNORMAL HIGH (ref 11.5–15.5)
WBC: 35.9 10*3/uL — ABNORMAL HIGH (ref 4.0–10.5)
nRBC: 2.4 % — ABNORMAL HIGH (ref 0.0–0.2)
nRBC: 5 /100 WBC — ABNORMAL HIGH

## 2018-09-18 LAB — COMPREHENSIVE METABOLIC PANEL
ALBUMIN: 3.1 g/dL — AB (ref 3.5–5.0)
ALK PHOS: 196 U/L — AB (ref 38–126)
ALT: 38 U/L (ref 0–44)
AST: 30 U/L (ref 15–41)
Anion gap: 11 (ref 5–15)
BUN: 10 mg/dL (ref 6–20)
CO2: 25 mmol/L (ref 22–32)
Calcium: 8.7 mg/dL — ABNORMAL LOW (ref 8.9–10.3)
Chloride: 100 mmol/L (ref 98–111)
Creatinine, Ser: 1.13 mg/dL (ref 0.61–1.24)
GFR calc Af Amer: >60 mL/min (ref >60)
GFR calc non Af Amer: >60 mL/min (ref >60)
Glucose, Bld: 98 mg/dL (ref 70–99)
Potassium: 3.4 mmol/L — ABNORMAL LOW (ref 3.5–5.1)
Sodium: 136 mmol/L (ref 135–145)
Total Bilirubin: 0.7 mg/dL (ref 0.3–1.2)
Total Protein: 6.2 g/dL — ABNORMAL LOW (ref 6.5–8.1)

## 2018-09-18 LAB — MAGNESIUM: Magnesium: 1.7 mg/dL (ref 1.7–2.4)

## 2018-09-18 LAB — LACTIC ACID, PLASMA: Lactic Acid, Venous: 1.9 mmol/L (ref 0.5–1.9)

## 2018-09-18 LAB — CK: Total CK: 14 U/L — ABNORMAL LOW (ref 49–397)

## 2018-09-18 MED ORDER — POTASSIUM CHLORIDE CRYS ER 20 MEQ PO TBCR
60.0000 meq | EXTENDED_RELEASE_TABLET | Freq: Once | ORAL | Status: AC
  Administered 2018-09-18: 60 meq via ORAL
  Filled 2018-09-18: qty 3

## 2018-09-18 MED ORDER — DIPHENHYDRAMINE HCL 25 MG PO CAPS
50.0000 mg | ORAL_CAPSULE | Freq: Once | ORAL | Status: AC
  Administered 2018-09-18: 50 mg via ORAL
  Filled 2018-09-18: qty 2

## 2018-09-18 MED ORDER — POLYETHYLENE GLYCOL 3350 17 G PO PACK
17.0000 g | PACK | Freq: Every day | ORAL | Status: DC | PRN
  Filled 2018-09-18: qty 1

## 2018-09-18 MED ORDER — SENNOSIDES-DOCUSATE SODIUM 8.6-50 MG PO TABS
1.0000 | ORAL_TABLET | Freq: Two times a day (BID) | ORAL | Status: DC
  Administered 2018-09-18 – 2018-09-20 (×5): 1 via ORAL
  Filled 2018-09-18 (×6): qty 1

## 2018-09-18 MED ORDER — SODIUM CHLORIDE 0.9 % IV BOLUS
500.0000 mL | Freq: Once | INTRAVENOUS | Status: AC
  Administered 2018-09-18: 500 mL via INTRAVENOUS

## 2018-09-18 MED ORDER — METHOCARBAMOL 500 MG PO TABS
500.0000 mg | ORAL_TABLET | Freq: Four times a day (QID) | ORAL | Status: DC | PRN
  Administered 2018-09-19 – 2018-09-20 (×4): 500 mg via ORAL
  Filled 2018-09-18 (×4): qty 1

## 2018-09-18 NOTE — Progress Notes (Addendum)
Patient ID: David Lam, male   DOB: 1996/03/15, 23 y.o.   MRN: [id number]  PROGRESS NOTE    David Lam  YQM:[id number] DOB: July 24, 1996 DOA: 09/15/2018 PCP: Patient, No Pcp Per   Brief Narrative:  23 year old male with history of Hodgkin's lymphoma currently on chemotherapy, pulmonary embolism on Xarelto presented with severe acute onset of low back pain with generalized weakness.  He was diagnosed with neutropenic fever and started on IV antibiotics.  Assessment & Plan:   Principal Problem:   Neutropenia with fever ([place]) Active Problems:   Hodgkin lymphoma ([place])   Port-A-Cath in place   Back pain   Pulmonary embolism ([facility])  Neutropenic fever causing sepsis in a patient with history of lymphoma undergoing chemotherapy -No clear-cut source of infection.  MRI of T and L-spine was unremarkable.  Chest x-ray and UA was unremarkable.  Blood cultures have been negative so far. -Still spiking temperatures and tachycardic. -Port-A-Cath site appears clean. -Currently on vancomycin and cefepime.  No evidence of MRSA infection.  Discontinue vancomycin.  Continue cefepime at least for 1 more day -Follow oncology recommendations.  We will continue home oral steroid dose -Still tachycardic.  Continue IV fluids. -Neutropenia has resolved.  He did get Neupogen by oncology with his last treatment.  Currently has leukocytosis.  Monitor -Repeat a.m. labs.  If persistently spikes temperature, might need further imaging studies.  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  Monitor  Thrombocytopenia -Probably from above.  No signs of bleeding.  Monitor  History of PE -Continue Xarelto.  Hypokalemia Replace.  Repeat a.m. labs  DVT prophylaxis: Xarelto Code Status: Full Family Communication: Mother at bedside.  Sister on phone Disposition Plan: Home in 1 to 3 days once clinically improved  Consultants: Oncology  Procedures: None  Antimicrobials: Cefepime and  vancomycin from 09/15/2018 onwards   Subjective: Patient seen and examined at bedside.  Still complains of back pain.  Still having fever.  Feels weak.  No overnight worsening abdominal pain or vomiting.  Objective: Vitals:   09/18/18 0120 09/18/18 0121 09/18/18 0240 09/18/18 0459  BP:  136/78 (!) 143/79 140/86  Pulse: (!) 131 (!) 135  (!) 131  Resp: (!) 27 (!) 23 (!) 38 (!) 32  Temp: (!) 101.5 F (38.6 C)  98.2 F (36.8 C) 99.2 F (37.3 C)  TempSrc: Axillary  Oral Oral  SpO2: 93% 94% 95% 93%  Weight:      Height:        Intake/Output Summary (Last 24 hours) at 09/18/2018 1123 Last data filed at 09/18/2018 0957 Gross per 24 hour  Intake 360 ml  Output 1600 ml  Net -1240 ml   Filed Weights   09/15/18 2258  Weight: 84.4 kg    Examination:  General exam: Appears calm and comfortable.  No distress Respiratory system: Bilateral decreased breath sounds at bases, no wheezing Cardiovascular system: S1 & S2 heard, tachycardic Gastrointestinal system: Abdomen is nondistended, soft and nontender. Normal bowel sounds heard. Extremities: No cyanosis, clubbing, edema  Central nervous system: Alert and oriented. No focal neurological deficits. Moving extremities Skin: No rashes, lesions or ulcers Psychiatry: Looks slightly anxious.    Data Reviewed: I have personally reviewed following labs and imaging studies  CBC: Recent Labs  Lab 09/11/18 1200 09/15/18 2256 09/17/18 0427 09/17/18 1020 09/18/18 0428  WBC 3.8* 0.7* 8.0 11.0* 35.9*  NEUTROABS 3.1  --  1.5* 7.6 24.1*  HGB 11.2* 11.7* 10.8* 9.9* 9.8*  HCT 32.9* 34.6* 32.0* 29.9* 28.6*  MCV 78.1* 80.3 77.9* 78.7* 77.3*  PLT 213 109* 70* 66* 77*   Basic Metabolic Panel: Recent Labs  Lab 09/11/18 1200 09/15/18 2256 09/17/18 0427 09/18/18 0428  NA 142 135 132* 136  K 3.4* 3.7 4.1 3.4*  CL 105 99 100 100  CO2 30 24 24 25   GLUCOSE 96 143* 130* 98  BUN 13 14 10 10   CREATININE 0.91 0.83 0.91 1.13  CALCIUM 8.7* 9.6  9.0 8.7*  MG  --   --   --  1.7   GFR: Estimated Creatinine Clearance: 106.6 mL/min (by C-G formula based on SCr of 1.13 mg/dL). Liver Function Tests: Recent Labs  Lab 09/11/18 1200 09/15/18 2256 09/18/18 0428  AST 10* 22 30  ALT 24 48* 38  ALKPHOS 65 71 196*  BILITOT 0.5 1.2 0.7  PROT 6.8 8.1 6.2*  ALBUMIN 3.8 4.5 3.1*   No results for input(s): LIPASE, AMYLASE in the last 168 hours. No results for input(s): AMMONIA in the last 168 hours. Coagulation Profile: No results for input(s): INR, PROTIME in the last 168 hours. Cardiac Enzymes: Recent Labs  Lab 09/18/18 0428  CKTOTAL 14*   BNP (last 3 results) No results for input(s): PROBNP in the last 8760 hours. HbA1C: No results for input(s): HGBA1C in the last 72 hours. CBG: No results for input(s): GLUCAP in the last 168 hours. Lipid Profile: No results for input(s): CHOL, HDL, LDLCALC, TRIG, CHOLHDL, LDLDIRECT in the last 72 hours. Thyroid Function Tests: No results for input(s): TSH, T4TOTAL, FREET4, T3FREE, THYROIDAB in the last 72 hours. Anemia Panel: No results for input(s): VITAMINB12, FOLATE, FERRITIN, TIBC, IRON, RETICCTPCT in the last 72 hours. Sepsis Labs: Recent Labs  Lab 09/16/18 7253 09/16/18 0831 09/16/18 2200 09/18/18 0428  LATICACIDVEN 3.5* 3.9* 2.3* 1.9    Recent Results (from the past 240 hour(s))  Blood culture (routine x 2)     Status: None (Preliminary result)   Collection Time: 09/16/18  6:25 AM  Result Value Ref Range Status   Specimen Description BLOOD LEFT ANTECUBITAL  Final   Special Requests   Final    BOTTLES DRAWN AEROBIC AND ANAEROBIC Blood Culture results may not be optimal due to an excessive volume of blood received in culture bottles   Culture   Final    NO GROWTH 2 DAYS Performed at [facility] [address] [place], [place] [address]    Report Status PENDING  Incomplete  Blood culture (routine x 2)     Status: None (Preliminary result)   Collection Time:  09/16/18  6:35 AM  Result Value Ref Range Status   Specimen Description BLOOD LEFT HAND  Final   Special Requests   Final    BOTTLES DRAWN AEROBIC AND ANAEROBIC Blood Culture adequate volume   Culture   Final    NO GROWTH 2 DAYS Performed at [facility], [place] [address], [place], [place] [address]    Report Status PENDING  Incomplete  Urine Culture     Status: None   Collection Time: 09/16/18  6:35 AM  Result Value Ref Range Status   Specimen Description URINE, RANDOM  Final   Special Requests NONE  Final   Culture   Final    NO GROWTH Performed at [facility], [place] [address] [place], [place] [address]    Report Status 09/17/2018 FINAL  Final  MRSA PCR Screening     Status: None   Collection Time: 09/17/18  9:29  AM  Result Value Ref Range Status   MRSA by PCR NEGATIVE NEGATIVE Final    Comment:        The GeneXpert MRSA Assay (FDA approved for NASAL specimens only), is one component of a comprehensive MRSA colonization surveillance program. It is not intended to diagnose MRSA infection nor to guide or monitor treatment for MRSA infections. Performed at [facility], [place] [address] [place], [place] [address]          Radiology Studies: No results found.      Scheduled Meds: . predniSONE  60 mg Oral Daily  . rivaroxaban  15 mg Oral BID WC   Followed by  . [START ON 09/26/2018] rivaroxaban  20 mg Oral Q supper   Continuous Infusions: . sodium chloride 100 mL/hr at 09/17/18 1512  . ceFEPime (MAXIPIME) IV 2 g (09/18/18 0930)     LOS: 2 days        David August, MD [facility] 09/18/2018, 11:23 AM

## 2018-09-18 NOTE — Discharge Instructions (Signed)
Information on my medicine - XARELTO (rivaroxaban)  This medication education was reviewed with me or my healthcare representative as part of my discharge preparation.  The pharmacist that spoke with me during my hospital stay was:  Onnie Boer, [place]? Xarelto was prescribed to treat blood clots that may have been found in the veins of your legs (deep vein thrombosis) or in your lungs (pulmonary embolism) and to reduce the risk of them occurring again.  What do you need to know about Xarelto? The starting dose is one 15 mg tablet taken TWICE daily with food for the FIRST 21 DAYS then on (enter date)  09/26/18  the dose is changed to one 20 mg tablet taken ONCE A DAY with your evening meal.  DO NOT stop taking Xarelto without talking to the health care provider who prescribed the medication.  Refill your prescription for 20 mg tablets before you run out.  After discharge, you should have regular check-up appointments with your healthcare provider that is prescribing your Xarelto.  In the future your dose may need to be changed if your kidney function changes by a significant amount.  What do you do if you miss a dose? If you are taking Xarelto TWICE DAILY and you miss a dose, take it as soon as you remember. You may take two 15 mg tablets (total 30 mg) at the same time then resume your regularly scheduled 15 mg twice daily the next day.  If you are taking Xarelto ONCE DAILY and you miss a dose, take it as soon as you remember on the same day then continue your regularly scheduled once daily regimen the next day. Do not take two doses of Xarelto at the same time.   Important Safety Information Xarelto is a blood thinner medicine that can cause bleeding. You should call your healthcare provider right away if you experience any of the following: ? Bleeding from an injury or your nose that does not stop. ? Unusual colored urine (red or dark brown) or  unusual colored stools (red or black). ? Unusual bruising for unknown reasons. ? A serious fall or if you hit your head (even if there is no bleeding).  Some medicines may interact with Xarelto and might increase your risk of bleeding while on Xarelto. To help avoid this, consult your healthcare provider or pharmacist prior to using any new prescription or non-prescription medications, including herbals, vitamins, non-steroidal anti-inflammatory drugs (NSAIDs) and supplements.  This website has more information on Xarelto: [email or web address].[email or web address]

## 2018-09-18 NOTE — Progress Notes (Signed)
No VS seen documented in flowsheet since 0459. MEWS has been red, So VS guidelines initiated for extra precaution, even though MEWS is 4, not 5. This RN explained connecting patient to [facility] monitor just for VS per guidelines. Patient slightly irritable, stating that he was told by the doctor that he will discharge tomorrow if temp remains less than 100 degrees. This RN explained that the extra monitoring is protocol and will not in itself keep patient admitted. This RN explained about patient's tachycardia and tachypnea. Patient repeatedly states that tachycardia is only because of his pain and him being up and exercising in the room. Sister, also, spoke to patient encouraging this. Patient ageed to be connected to the [facility] monitor for approximately 2 hours. VS documented per guidelines from 2015-2230. Patient ready for bed and asked for monitor to be removed. Tachycardia and tachypnea still present but no distress noted. Patient agrees for VS check when next IV ABX due. Patient's mother in room at this time. Will continue to monitor. Denies needs.

## 2018-09-18 NOTE — Progress Notes (Signed)
Paged NP Schoor at [phone] about patient in ST in 120's- low 130's, temp 100.6 and patient's request for Benadryl.   Fever 101.5 at 0120; PRN Tylenol given at [place]. Temp rechecked at 0240; 98.2. Will continue to monitor.

## 2018-09-19 ENCOUNTER — Inpatient Hospital Stay (HOSPITAL_COMMUNITY): Payer: BLUE CROSS/BLUE SHIELD

## 2018-09-19 DIAGNOSIS — Z95828 Presence of other vascular implants and grafts: Secondary | ICD-10-CM

## 2018-09-19 LAB — COMPREHENSIVE METABOLIC PANEL
ALT: 36 U/L (ref 0–44)
ANION GAP: 9 (ref 5–15)
AST: 25 U/L (ref 15–41)
Albumin: 3 g/dL — ABNORMAL LOW (ref 3.5–5.0)
Alkaline Phosphatase: 239 U/L — ABNORMAL HIGH (ref 38–126)
BUN: 13 mg/dL (ref 6–20)
CO2: 25 mmol/L (ref 22–32)
Calcium: 8.6 mg/dL — ABNORMAL LOW (ref 8.9–10.3)
Chloride: 102 mmol/L (ref 98–111)
Creatinine, Ser: 1.07 mg/dL (ref 0.61–1.24)
GFR calc Af Amer: >60 mL/min (ref >60)
GFR calc non Af Amer: >60 mL/min (ref >60)
Glucose, Bld: 92 mg/dL (ref 70–99)
POTASSIUM: 3.4 mmol/L — AB (ref 3.5–5.1)
Sodium: 136 mmol/L (ref 135–145)
Total Bilirubin: 1 mg/dL (ref 0.3–1.2)
Total Protein: 6.3 g/dL — ABNORMAL LOW (ref 6.5–8.1)

## 2018-09-19 LAB — CBC WITH DIFFERENTIAL/PLATELET
Band Neutrophils: 0 %
Basophils Absolute: 0 10*3/uL (ref 0.0–0.1)
Basophils Relative: 0 %
Blasts: 0 %
Eosinophils Absolute: 0 10*3/uL (ref 0.0–0.5)
Eosinophils Relative: 0 %
HEMATOCRIT: 28.9 % — AB (ref 39.0–52.0)
Hemoglobin: 10.1 g/dL — ABNORMAL LOW (ref 13.0–17.0)
Lymphocytes Relative: 2 %
Lymphs Abs: 1.1 10*3/uL (ref 0.7–4.0)
MCH: 27.1 pg (ref 26.0–34.0)
MCHC: 34.9 g/dL (ref 30.0–36.0)
MCV: 77.5 fL — ABNORMAL LOW (ref 80.0–100.0)
MONOS PCT: 11 %
Metamyelocytes Relative: 0 %
Monocytes Absolute: 5.8 10*3/uL — ABNORMAL HIGH (ref 0.1–1.0)
Myelocytes: 0 %
Neutro Abs: 45.6 10*3/uL — ABNORMAL HIGH (ref 1.7–7.7)
Neutrophils Relative %: 87 %
Other: 0 %
Platelets: 89 10*3/uL — ABNORMAL LOW (ref 150–400)
Promyelocytes Relative: 0 %
RBC: 3.73 MIL/uL — ABNORMAL LOW (ref 4.22–5.81)
RDW: 16.2 % — ABNORMAL HIGH (ref 11.5–15.5)
WBC: 52.5 10*3/uL (ref 4.0–10.5)
nRBC: 0 /100 WBC
nRBC: 2.5 % — ABNORMAL HIGH (ref 0.0–0.2)

## 2018-09-19 LAB — MAGNESIUM: Magnesium: 2 mg/dL (ref 1.7–2.4)

## 2018-09-19 LAB — PATHOLOGIST SMEAR REVIEW

## 2018-09-19 MED ORDER — POTASSIUM CHLORIDE CRYS ER 20 MEQ PO TBCR
40.0000 meq | EXTENDED_RELEASE_TABLET | Freq: Once | ORAL | Status: AC
  Administered 2018-09-19: 40 meq via ORAL
  Filled 2018-09-19: qty 2

## 2018-09-19 MED ORDER — PNEUMOCOCCAL VAC POLYVALENT 25 MCG/0.5ML IJ INJ
0.5000 mL | INJECTION | INTRAMUSCULAR | Status: AC
  Administered 2018-09-19: 0.5 mL via INTRAMUSCULAR
  Filled 2018-09-19 (×2): qty 0.5

## 2018-09-19 MED ORDER — BISACODYL 10 MG RE SUPP
10.0000 mg | Freq: Every day | RECTAL | Status: DC | PRN
  Administered 2018-09-19: 10 mg via RECTAL
  Filled 2018-09-19 (×2): qty 1

## 2018-09-19 MED ORDER — LACTULOSE 10 GM/15ML PO SOLN
10.0000 g | Freq: Three times a day (TID) | ORAL | Status: DC
  Administered 2018-09-19 – 2018-09-20 (×5): 10 g via ORAL
  Filled 2018-09-19 (×5): qty 15

## 2018-09-19 MED ORDER — POLYETHYLENE GLYCOL 3350 17 G PO PACK
17.0000 g | PACK | Freq: Every day | ORAL | Status: DC
  Administered 2018-09-19 – 2018-09-20 (×2): 17 g via ORAL
  Filled 2018-09-19 (×2): qty 1

## 2018-09-19 NOTE — Progress Notes (Signed)
Patient ID: David Lam, male   DOB: 30-Jun-1996, 23 y.o.   MRN: [id number]  PROGRESS NOTE    David Lam  UXL:[id number] DOB: [birth year] DOA: 09/15/2018 PCP: Patient, No Pcp Per   Brief Narrative:  23 year old male with history of Hodgkin's lymphoma currently on chemotherapy, pulmonary embolism on Xarelto presented with severe acute onset of low back pain with generalized weakness.  He was diagnosed with neutropenic fever and started on IV antibiotics.  Assessment & Plan:   Principal Problem:   Neutropenia with fever ([place]) Active Problems:   Hodgkin lymphoma ([place])   Port-A-Cath in place   Back pain   Pulmonary embolism ([facility])  Neutropenic fever causing sepsis in a patient with history of lymphoma undergoing chemotherapy -No clear-cut source of infection.  MRI of T and L-spine was unremarkable.  Chest x-ray and UA was unremarkable.  Blood cultures have been negative so far. -T-max of 100.7 over the last 24 hours.  Still tachycardic. -Port-A-Cath site appears clean. -Started on vancomycin and cefepime on admission.  Vancomycin discontinued on 09/18/2018.  Continue cefepime for now.  If the patient continues to spike temperatures, will consider doing CT of the chest abdomen pelvis.  He had a CT of the chest done 2 weeks ago. -Follow oncology recommendations.  We will continue home oral steroid dose -Still tachycardic.  Continue IV fluids. -Neutropenia has resolved.  He did get Neupogen by oncology with his last treatment.  Currently has leukocytosis.  Monitor -Repeat a.m. labs.   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  Monitor  Thrombocytopenia -Probably from above.  No signs of bleeding.  Monitor  History of PE -Continue Xarelto.  Hypokalemia -Replace.  Repeat a.m. labs  DVT prophylaxis: Xarelto Code Status: Full Family Communication: Mother at bedside.  Sister on phone Disposition  Plan: Home in 1 to 3 days once clinically improved  Consultants: Oncology official evaluation is pending.  Procedures: None  Antimicrobials: Cefepime from 09/15/2018 onwards Vancomycin from 09/15/2018-09/17/2018   Subjective: Patient seen and examined at bedside.  Still complains of back pain.  Still having fever.   No overnight worsening abdominal pain or vomiting.  Has not had a bowel movement yet.  His appetite is not good yet.  Objective: Vitals:   09/19/18 0005 09/19/18 0109 09/19/18 0457 09/19/18 0648  BP:  130/79 139/84 131/84  Pulse:  (!) 128 (!) 128 (!) 133  Resp: (!) 30 (!) 34 (!) 26 16  Temp:  100.3 F (37.9 C) 100.1 F (37.8 C) (!) 100.7 F (38.2 C)  TempSrc:  Oral Oral Oral  SpO2:  93% 93% 93%  Weight:      Height:        Intake/Output Summary (Last 24 hours) at 09/19/2018 1041 Last data filed at 09/19/2018 0630 Gross per 24 hour  Intake 1432 ml  Output 350 ml  Net 1082 ml   Filed Weights   09/15/18 2258  Weight: 84.4 kg    Examination:  General exam: Appears calm and comfortable.  No acute distress Respiratory system: Bilateral decreased breath sounds at bases Cardiovascular system: S1 & S2 heard, still tachycardic  gastrointestinal system: Abdomen is nondistended, soft and nontender. Normal bowel sounds heard. Extremities: No cyanosis, clubbing, edema    Data Reviewed: I have personally reviewed following labs and imaging studies  CBC: Recent Labs  Lab 09/15/18 2256 09/17/18 0427 09/17/18 1020 09/18/18 0428 09/19/18 0350  WBC 0.7* 8.0  11.0* 35.9* 52.5*  NEUTROABS  --  1.5* 7.6 24.1* 45.6*  HGB 11.7* 10.8* 9.9* 9.8* 10.1*  HCT 34.6* 32.0* 29.9* 28.6* 28.9*  MCV 80.3 77.9* 78.7* 77.3* 77.5*  PLT 109* 70* 66* 77* 89*   Basic Metabolic Panel: Recent Labs  Lab 09/15/18 2256 09/17/18 0427 09/18/18 0428 09/19/18 0350  NA 135 132* 136 136  K 3.7 4.1 3.4* 3.4*  CL 99 100 100 102  CO2 24 24 25 25   GLUCOSE 143* 130* 98 92  BUN 14 10 10 13     CREATININE 0.83 0.91 1.13 1.07  CALCIUM 9.6 9.0 8.7* 8.6*  MG  --   --  1.7 2.0   GFR: Estimated Creatinine Clearance: 112.6 mL/min (by C-G formula based on SCr of 1.07 mg/dL). Liver Function Tests: Recent Labs  Lab 09/15/18 2256 09/18/18 0428 09/19/18 0350  AST 22 30 25   ALT 48* 38 36  ALKPHOS 71 196* 239*  BILITOT 1.2 0.7 1.0  PROT 8.1 6.2* 6.3*  ALBUMIN 4.5 3.1* 3.0*   No results for input(s): LIPASE, AMYLASE in the last 168 hours. No results for input(s): AMMONIA in the last 168 hours. Coagulation Profile: No results for input(s): INR, PROTIME in the last 168 hours. Cardiac Enzymes: Recent Labs  Lab 09/18/18 0428  CKTOTAL 14*   BNP (last 3 results) No results for input(s): PROBNP in the last 8760 hours. HbA1C: No results for input(s): HGBA1C in the last 72 hours. CBG: No results for input(s): GLUCAP in the last 168 hours. Lipid Profile: No results for input(s): CHOL, HDL, LDLCALC, TRIG, CHOLHDL, LDLDIRECT in the last 72 hours. Thyroid Function Tests: No results for input(s): TSH, T4TOTAL, FREET4, T3FREE, THYROIDAB in the last 72 hours. Anemia Panel: No results for input(s): VITAMINB12, FOLATE, FERRITIN, TIBC, IRON, RETICCTPCT in the last 72 hours. Sepsis Labs: Recent Labs  Lab 09/16/18 4132 09/16/18 0831 09/16/18 2200 09/18/18 0428  LATICACIDVEN 3.5* 3.9* 2.3* 1.9    Recent Results (from the past 240 hour(s))  Blood culture (routine x 2)     Status: None (Preliminary result)   Collection Time: 09/16/18  6:25 AM  Result Value Ref Range Status   Specimen Description BLOOD LEFT ANTECUBITAL  Final   Special Requests   Final    BOTTLES DRAWN AEROBIC AND ANAEROBIC Blood Culture results may not be optimal due to an excessive volume of blood received in culture bottles   Culture   Final    NO GROWTH 2 DAYS Performed at [facility] [address], [place], [place] [address]    Report Status PENDING  Incomplete  Blood culture (routine x 2)      Status: None (Preliminary result)   Collection Time: 09/16/18  6:35 AM  Result Value Ref Range Status   Specimen Description BLOOD LEFT HAND  Final   Special Requests   Final    BOTTLES DRAWN AEROBIC AND ANAEROBIC Blood Culture adequate volume   Culture   Final    NO GROWTH 2 DAYS Performed at [facility], [place] [address] [place], [place] [address]    Report Status PENDING  Incomplete  Urine Culture     Status: None   Collection Time: 09/16/18  6:35 AM  Result Value Ref Range Status   Specimen Description URINE, RANDOM  Final   Special Requests NONE  Final   Culture   Final    NO GROWTH Performed at [facility], [place] [address], [place], [place] [address]  Report Status 09/17/2018 FINAL  Final  MRSA PCR Screening     Status: None   Collection Time: 09/17/18  9:29 AM  Result Value Ref Range Status   MRSA by PCR NEGATIVE NEGATIVE Final    Comment:        The GeneXpert MRSA Assay (FDA approved for NASAL specimens only), is one component of a comprehensive MRSA colonization surveillance program. It is not intended to diagnose MRSA infection nor to guide or monitor treatment for MRSA infections. Performed at [facility], [place] [address] [place], [place] [address]          Radiology Studies: No results found.      Scheduled Meds: . pneumococcal 23 valent vaccine  0.5 mL Intramuscular Tomorrow-1000  . polyethylene glycol  17 g Oral Daily  . predniSONE  60 mg Oral Daily  . rivaroxaban  15 mg Oral BID WC   Followed by  . [START ON 09/26/2018] rivaroxaban  20 mg Oral Q supper  . senna-docusate  1 tablet Oral BID   Continuous Infusions: . ceFEPime (MAXIPIME) IV 2 g (09/19/18 0948)     LOS: 3 days        Aline August, MD [facility] 09/19/2018, 10:41 AM

## 2018-09-19 NOTE — Progress Notes (Signed)
CRITICAL VALUE ALERT  Critical Value:  WBC 52.5  Date & Time Notied:  09/19/18 0104  Provider Notified: Schorr, NP 09/19/18 0535 via telephone.  Orders Received/Actions taken: No new orders at this time.

## 2018-09-19 NOTE — Progress Notes (Signed)
K+ 3.4, d/w Dr Harlen Labs and we will give kcl 29meq x1  Onnie Boer, PharmD, BCIDP, AAHIVP, CPP Infectious Disease Pharmacist 09/19/2018 11:57 AM

## 2018-09-20 ENCOUNTER — Inpatient Hospital Stay (HOSPITAL_COMMUNITY): Payer: BLUE CROSS/BLUE SHIELD

## 2018-09-20 DIAGNOSIS — R509 Fever, unspecified: Secondary | ICD-10-CM

## 2018-09-20 LAB — CBC WITH DIFFERENTIAL/PLATELET
Abs Immature Granulocytes: 11.7 10*3/uL — ABNORMAL HIGH (ref 0.00–0.07)
BASOS PCT: 0 %
Basophils Absolute: 0 10*3/uL (ref 0.0–0.1)
Eosinophils Absolute: 0 10*3/uL (ref 0.0–0.5)
Eosinophils Relative: 0 %
HCT: 28.4 % — ABNORMAL LOW (ref 39.0–52.0)
Hemoglobin: 9.5 g/dL — ABNORMAL LOW (ref 13.0–17.0)
Immature Granulocytes: 24 %
Lymphocytes Relative: 4 %
Lymphs Abs: 1.8 10*3/uL (ref 0.7–4.0)
MCH: 26.1 pg (ref 26.0–34.0)
MCHC: 33.5 g/dL (ref 30.0–36.0)
MCV: 78 fL — ABNORMAL LOW (ref 80.0–100.0)
MONOS PCT: 7 %
Monocytes Absolute: 3.4 10*3/uL — ABNORMAL HIGH (ref 0.1–1.0)
Neutro Abs: 31 10*3/uL — ABNORMAL HIGH (ref 1.7–7.7)
Neutrophils Relative %: 65 %
Platelets: 86 10*3/uL — ABNORMAL LOW (ref 150–400)
RBC: 3.64 MIL/uL — ABNORMAL LOW (ref 4.22–5.81)
RDW: 16.6 % — AB (ref 11.5–15.5)
WBC: 47.9 10*3/uL — ABNORMAL HIGH (ref 4.0–10.5)
nRBC: 1.8 % — ABNORMAL HIGH (ref 0.0–0.2)

## 2018-09-20 LAB — COMPREHENSIVE METABOLIC PANEL
ALT: 41 U/L (ref 0–44)
AST: 24 U/L (ref 15–41)
Albumin: 2.9 g/dL — ABNORMAL LOW (ref 3.5–5.0)
Alkaline Phosphatase: 215 U/L — ABNORMAL HIGH (ref 38–126)
Anion gap: 8 (ref 5–15)
BUN: 14 mg/dL (ref 6–20)
CO2: 26 mmol/L (ref 22–32)
CREATININE: 0.97 mg/dL (ref 0.61–1.24)
Calcium: 8.4 mg/dL — ABNORMAL LOW (ref 8.9–10.3)
Chloride: 103 mmol/L (ref 98–111)
GFR calc Af Amer: >60 mL/min (ref >60)
GFR calc non Af Amer: >60 mL/min (ref >60)
GLUCOSE: 105 mg/dL — AB (ref 70–99)
Potassium: 3.7 mmol/L (ref 3.5–5.1)
Sodium: 137 mmol/L (ref 135–145)
TOTAL PROTEIN: 6.2 g/dL — AB (ref 6.5–8.1)
Total Bilirubin: 0.6 mg/dL (ref 0.3–1.2)

## 2018-09-20 LAB — ECHOCARDIOGRAM COMPLETE
Height: 68 in
Weight: 2976 oz

## 2018-09-20 LAB — MAGNESIUM: Magnesium: 2.2 mg/dL (ref 1.7–2.4)

## 2018-09-20 MED ORDER — BISACODYL 10 MG RE SUPP: 10.0000 mg | Freq: Every day | RECTAL | 0 refills | Status: DC | PRN

## 2018-09-20 MED ORDER — HYDROCODONE-ACETAMINOPHEN 7.5-325 MG PO TABS: 1.0000 | ORAL_TABLET | Freq: Four times a day (QID) | ORAL | 0 refills | Status: DC | PRN

## 2018-09-20 MED ORDER — SENNOSIDES-DOCUSATE SODIUM 8.6-50 MG PO TABS: 1.0000 | ORAL_TABLET | Freq: Two times a day (BID) | ORAL | 0 refills | Status: AC

## 2018-09-20 MED ORDER — FLEET ENEMA 7-19 GM/118ML RE ENEM
1.0000 | ENEMA | Freq: Once | RECTAL | Status: AC
  Administered 2018-09-20: 1 via RECTAL
  Filled 2018-09-20: qty 1

## 2018-09-20 MED ORDER — HEPARIN SOD (PORK) LOCK FLUSH 100 UNIT/ML IV SOLN
500.0000 [IU] | INTRAVENOUS | Status: AC | PRN
  Administered 2018-09-20: 500 [IU]

## 2018-09-20 MED ORDER — METHOCARBAMOL 500 MG PO TABS: 500.0000 mg | ORAL_TABLET | Freq: Four times a day (QID) | ORAL | 0 refills | Status: DC | PRN

## 2018-09-20 MED ORDER — POLYETHYLENE GLYCOL 3350 17 G PO PACK: 17.0000 g | PACK | Freq: Every day | ORAL | 0 refills | Status: AC

## 2018-09-20 NOTE — Discharge Summary (Signed)
Physician Discharge Summary  [name] KXF:[id number] DOB: 1996/07/23 DOA: 09/15/2018  PCP: Patient, No Pcp Per  Admit date: 09/15/2018 Discharge date: 09/20/2018  Admitted From: Home Disposition:  Home  Recommendations for Outpatient Follow-up:  1. Follow up with PCP in 1 week  2. Outpatient follow-up with oncology/Dr. Irene Limbo 3. Follow up in ED if symptoms worsen or new appear   Home Health: No Equipment/Devices: None  Discharge Condition: Stable CODE STATUS: Full Diet recommendation: Regular  Brief/Interim Summary: 23 year old male with history of Hodgkin's lymphoma currently on chemotherapy, pulmonary embolism on Xarelto presented with severe acute onset of low back pain with generalized weakness.  He was diagnosed with neutropenic fever and started on IV antibiotics.  During the hospitalization, blood cultures remain negative.  Vancomycin was discontinued.  Neutropenia resolved.  Currently has leukocytosis.  He is afebrile for last 24 hours.  He will be discharged home with outpatient follow-up with oncology.  Discharge Diagnoses:  Principal Problem:   Neutropenia with fever (David Lam) Active Problems:   Hodgkin lymphoma (David Lam)   Port-A-Cath in place   Back pain   Pulmonary embolism (David Lam)  Neutropenic fever causing sepsis in a patient with history of lymphoma undergoing chemotherapy -No clear-cut source of infection.  MRI of T and L-spine was unremarkable.  Chest x-ray and UA was unremarkable.  Blood cultures have been negative so far. -Port-A-Cath site appears clean. -Started on vancomycin and cefepime on admission.  Vancomycin discontinued on 09/18/2018.    Currently on cefepime  -Afebrile for the last 24 hours.  Spoke to Dr. Kale/oncology on phone who is okay for the patient to be discharged home with outpatient follow-up with him next week.  DC prednisone.  Patient was supposed to be on prednisone for 2 weeks. -Neutropenia has resolved.  He did get Neupogen by oncology with his  last treatment.  Currently has leukocytosis.  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  Monitor  Thrombocytopenia -Probably from above.  No signs of bleeding.    Outpatient follow-up  History of PE -Continue Xarelto.  Hypokalemia -Improved.  Discharge Instructions  Discharge Instructions    Call MD for:  extreme fatigue   Complete by:  As directed    Call MD for:  persistant dizziness or light-headedness   Complete by:  As directed    Call MD for:  persistant nausea and vomiting   Complete by:  As directed    Call MD for:  temperature >100.4   Complete by:  As directed    Diet general   Complete by:  As directed    Increase activity slowly   Complete by:  As directed      Allergies as of 09/20/2018   No Known Allergies     Medication List    STOP taking these medications   predniSONE 20 MG tablet Commonly known as:  DELTASONE     TAKE these medications   bisacodyl 10 MG suppository Commonly known as:  DULCOLAX Place 1 suppository (10 mg total) rectally daily as needed for moderate constipation.   diphenhydrAMINE 25 mg capsule Commonly known as:  BENADRYL Take 25 mg by mouth every 6 (six) hours as needed for allergies.   HYDROcodone-acetaminophen 7.5-325 MG tablet Commonly known as:  NORCO Take 1 tablet by mouth every 6 (six) hours as needed for moderate pain.   lidocaine-prilocaine cream Commonly known as:  EMLA Apply 1 application topically as needed. What changed:  reasons to  take this   LORazepam 0.5 MG tablet Commonly known as:  ATIVAN Take 1 tablet (0.5 mg total) by mouth every 6 (six) hours as needed (Nausea or vomiting).   methocarbamol 500 MG tablet Commonly known as:  ROBAXIN Take 1 tablet (500 mg total) by mouth every 6 (six) hours as needed for muscle spasms.   ondansetron 8 MG tablet Commonly known as:  ZOFRAN Take 1 tablet (8 mg total) by mouth 2 (two) times  daily as needed. Start on the third day after doxorubicin/cytoxan chemotherapy.   polyethylene glycol packet Commonly known as:  MIRALAX / GLYCOLAX Take 17 g by mouth daily. Start taking on:  September 21, 2018   procarbazine 50 MG capsule Commonly known as:  MATULANE Take 4 capsules (200 mg total) by mouth daily. Take daily on days 1-7 of each 21 day cycle. Follow low tyramine diet.   prochlorperazine 10 MG tablet Commonly known as:  COMPAZINE Take 1 tablet (10 mg total) by mouth every 6 (six) hours as needed for nausea or vomiting.   Rivaroxaban 15 & 20 MG Tbpk Take as directed on package: Start with one 15mg  tablet by mouth twice a day with food. On Day 22, switch to one 20mg  tablet once a day with food.   senna-docusate 8.6-50 MG tablet Commonly known as:  Senokot-S Take 1 tablet by mouth 2 (two) times daily.      Follow-up Information    PCP. Schedule an appointment as soon as possible for a visit in 1 week(s).        David Genera, MD Follow up.   Specialties:  Hematology, Oncology Why:  Keep scheduled appointment Contact information: [place] [address] [phone]          No Known Allergies  Consultations:  Oncology   Procedures/Studies: Dg Chest 2 View  Result Date: 09/04/2018 CLINICAL DATA:  Chest pain, history of Hodgkin's lymphoma EXAM: CHEST - 2 VIEW COMPARISON:  08/16/2018 PET-CT FINDINGS: Cardiac shadow is within normal limits. Increased soft tissue density is along the right hilum and right paratracheal region as well as the superior mediastinum on the left consistent with the previously seen lymph nodes. Lungs are clear. No bony abnormality is noted. Right chest wall port is seen. IMPRESSION: Changes consistent with mediastinal adenopathy similar to that seen on prior PET-CT. No acute abnormality is noted. Electronically Signed   By: Inez Catalina M.D.   On: 09/04/2018 02:32   Dg Abd 1 View  Result Date:  09/19/2018 CLINICAL DATA:  Abdominal distension. EXAM: ABDOMEN - 1 VIEW COMPARISON:  None. FINDINGS: The bowel gas pattern is normal. No radio-opaque calculi or other significant radiographic abnormality are seen. IMPRESSION: Negative.  Extensive bowel content is identified in the right colon. Electronically Signed   By: Abelardo Diesel M.D.   On: 09/19/2018 12:47   Ct Angio Chest Pe W And/or Wo Contrast  Result Date: 09/04/2018 CLINICAL DATA:  Chest pain. EXAM: CT ANGIOGRAPHY CHEST WITH CONTRAST TECHNIQUE: Multidetector CT imaging of the chest was performed using the standard protocol during bolus administration of intravenous contrast. Multiplanar CT image reconstructions and MIPs were obtained to evaluate the vascular anatomy. CONTRAST:  40mL ISOVUE-370 IOPAMIDOL (ISOVUE-370) INJECTION 76% COMPARISON:  08/16/2018 PET-CT FINDINGS: Cardiovascular: 2 branching central pulmonary filling defects seen in subsegmental right upper lobe branches. No additional acute pulmonary embolism is seen. Normal heart size. No pericardial effusion. Porta catheter on the right with tip at the upper right atrium. Mediastinum/Nodes:  Known anterior mediastinal mass recently staged by PET. Lungs/Pleura: Generalized airway thickening and mosaic lung attenuation. This is likely accentuated by expiratory imaging. Similar findings were seen on 2020 PET-CT, although better inflated at that time. Even though there is acute pulmonary embolism this is most likely due to small airways disease given the extent greater than visible PE and the airway thickening. Upper Abdomen: Negative Musculoskeletal: Negative Critical Value/emergent results were called by telephone at the time of interpretation on 09/04/2018 at 6:19 am to Dr. Addison Lank , who verbally acknowledged these results. Review of the MIP images confirms the above findings. IMPRESSION: 1. Positive for 2 acute subsegmental pulmonary emboli in the right upper lobe. 2. Heterogeneous  aeration diffusely, likely small airways disease with air trapping given there is also generalized bronchial wall thickening. 3. 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 Electronically Signed   By: Monte Fantasia M.D.   On: 09/04/2018 06:20   Mr Thoracic Spine W Wo Contrast  Result Date: 09/16/2018 CLINICAL DATA:  23 y/o M; sudden onset severe lower back pain and bilateral leg weakness. History of non-Hodgkin's lymphoma. EXAM: MRI THORACIC AND LUMBAR SPINE WITHOUT AND WITH CONTRAST TECHNIQUE: Multiplanar and multiecho pulse sequences of the thoracic and lumbar spine were obtained without and with intravenous contrast. CONTRAST:  8 cc Gadavist COMPARISON:  09/04/2018 CT chest FINDINGS: MRI THORACIC SPINE FINDINGS Motion degradation of axial sequences. Alignment:  Physiologic. Vertebrae: No fracture, evidence of discitis, or bone lesion. No abnormal enhancement. Cord:  Normal signal and morphology.  No abnormal enhancement. Paraspinal and other soft tissues: Negative. Disc levels: No significant disc displacement, foraminal stenosis, or canal stenosis. MRI LUMBAR SPINE FINDINGS Segmentation:  Standard. Alignment:  Physiologic. Vertebrae: No fracture, evidence of discitis, or bone lesion. No abnormal enhancement. Conus medullaris: Extends to the L1 level and appears normal. No abnormal enhancement. Paraspinal and other soft tissues: Right kidney lower pole 16 mm cyst. Disc levels: L1-2: No significant disc displacement, foraminal stenosis, or canal stenosis. L2-3: No significant disc displacement, foraminal stenosis, or canal stenosis. L3-4: No significant disc displacement, foraminal stenosis, or canal stenosis. L4-5: No significant disc displacement, foraminal stenosis, or canal stenosis. L5-S1: Central and left subarticular disc protrusion with annular fissure contacting the descending S1 nerve roots in the lateral recesses bilaterally. Mild bilateral foraminal stenosis and spinal canal stenosis.  IMPRESSION: MRI thoracic spine: 1. Motion degradation of axial sequences. 2. No abnormal osseous or cord signal abnormality. 3. No significant disc displacement, foraminal stenosis, or canal stenosis. MRI lumbar spine: 1. No abnormal osseous or cord signal abnormality. 2. L5-S1 central and left subarticular disc protrusion with annular fissure contacting the descending S1 nerve roots in the lateral recesses as well as resulting in mild bilateral foraminal stenosis and spinal canal stenosis. Electronically Signed   By: Kristine Garbe M.D.   On: 09/16/2018 05:40   Mr Lumbar Spine W Wo Contrast  Result Date: 09/16/2018 CLINICAL DATA:  23 y/o M; sudden onset severe lower back pain and bilateral leg weakness. History of non-Hodgkin's lymphoma. EXAM: MRI THORACIC AND LUMBAR SPINE WITHOUT AND WITH CONTRAST TECHNIQUE: Multiplanar and multiecho pulse sequences of the thoracic and lumbar spine were obtained without and with intravenous contrast. CONTRAST:  8 cc Gadavist COMPARISON:  09/04/2018 CT chest FINDINGS: MRI THORACIC SPINE FINDINGS Motion degradation of axial sequences. Alignment:  Physiologic. Vertebrae: No fracture, evidence of discitis, or bone lesion. No abnormal enhancement. Cord:  Normal signal and morphology.  No abnormal enhancement. Paraspinal and other  soft tissues: Negative. Disc levels: No significant disc displacement, foraminal stenosis, or canal stenosis. MRI LUMBAR SPINE FINDINGS Segmentation:  Standard. Alignment:  Physiologic. Vertebrae: No fracture, evidence of discitis, or bone lesion. No abnormal enhancement. Conus medullaris: Extends to the L1 level and appears normal. No abnormal enhancement. Paraspinal and other soft tissues: Right kidney lower pole 16 mm cyst. Disc levels: L1-2: No significant disc displacement, foraminal stenosis, or canal stenosis. L2-3: No significant disc displacement, foraminal stenosis, or canal stenosis. L3-4: No significant disc displacement, foraminal  stenosis, or canal stenosis. L4-5: No significant disc displacement, foraminal stenosis, or canal stenosis. L5-S1: Central and left subarticular disc protrusion with annular fissure contacting the descending S1 nerve roots in the lateral recesses bilaterally. Mild bilateral foraminal stenosis and spinal canal stenosis. IMPRESSION: MRI thoracic spine: 1. Motion degradation of axial sequences. 2. No abnormal osseous or cord signal abnormality. 3. No significant disc displacement, foraminal stenosis, or canal stenosis. MRI lumbar spine: 1. No abnormal osseous or cord signal abnormality. 2. L5-S1 central and left subarticular disc protrusion with annular fissure contacting the descending S1 nerve roots in the lateral recesses as well as resulting in mild bilateral foraminal stenosis and spinal canal stenosis. Electronically Signed   By: Kristine Garbe M.D.   On: 09/16/2018 05:40   Dg Chest Portable 1 View  Result Date: 09/16/2018 CLINICAL DATA:  History of non-Hodgkin's lymphoma with back pain, initial encounter EXAM: PORTABLE CHEST 1 VIEW COMPARISON:  09/04/2018 FINDINGS: Cardiac shadow is stable. Right chest wall port is again identified and stable. Lungs are well aerated bilaterally. No focal infiltrate or sizable effusion is seen. Previously seen lymphadenopathy is stable given some slight variation in patient positioning. No bony abnormality is noted. IMPRESSION: No acute abnormality noted. Stable changes of lymphadenopathy consistent with the given clinical history. Electronically Signed   By: Inez Catalina M.D.   On: 09/16/2018 07:03   Vas [place] Lower Extremity Venous (dvt)  Result Date: 09/12/2018  Lower Venous Study Indications: Pulmonary embolism.  Performing Technologist: Abram Sander RVS  Examination Guidelines: A complete evaluation includes B-mode imaging, spectral Doppler, color Doppler, and power Doppler as needed of all accessible portions of each vessel. Bilateral testing is considered an  integral part of a complete examination. Limited examinations for reoccurring indications may be performed as noted.  Right Venous Findings: +---------+---------------+---------+-----------+----------+-------+          CompressibilityPhasicitySpontaneityPropertiesSummary +---------+---------------+---------+-----------+----------+-------+ CFV      Full           Yes      Yes                          +---------+---------------+---------+-----------+----------+-------+ SFJ      Full                                                 +---------+---------------+---------+-----------+----------+-------+ FV Prox  Full                                                 +---------+---------------+---------+-----------+----------+-------+ FV Mid   Full                                                 +---------+---------------+---------+-----------+----------+-------+  FV DistalFull                                                 +---------+---------------+---------+-----------+----------+-------+ PFV      Full                                                 +---------+---------------+---------+-----------+----------+-------+ POP      Full           Yes      Yes                          +---------+---------------+---------+-----------+----------+-------+ PTV      Full                                                 +---------+---------------+---------+-----------+----------+-------+ PERO     Full                                                 +---------+---------------+---------+-----------+----------+-------+  Left Venous Findings: +---------+---------------+---------+-----------+----------+-------+          CompressibilityPhasicitySpontaneityPropertiesSummary +---------+---------------+---------+-----------+----------+-------+ CFV      Full           Yes      Yes                           +---------+---------------+---------+-----------+----------+-------+ SFJ      Full                                                 +---------+---------------+---------+-----------+----------+-------+ FV Prox  Full                                                 +---------+---------------+---------+-----------+----------+-------+ FV Mid   Full                                                 +---------+---------------+---------+-----------+----------+-------+ FV DistalFull                                                 +---------+---------------+---------+-----------+----------+-------+ PFV      Full                                                 +---------+---------------+---------+-----------+----------+-------+  POP      Full           Yes      Yes                          +---------+---------------+---------+-----------+----------+-------+ PTV      Full                                                 +---------+---------------+---------+-----------+----------+-------+ PERO     Full                                                 +---------+---------------+---------+-----------+----------+-------+    Summary: Right: There is no evidence of deep vein thrombosis in the lower extremity. No cystic structure found in the popliteal fossa. Left: There is no evidence of deep vein thrombosis in the lower extremity. No cystic structure found in the popliteal fossa.  *See table(s) above for measurements and observations. Electronically signed by Harold Barban MD on 09/12/2018 at 4:09:45 PM.    Final        Subjective: Patient seen and examined at bedside.  He denies any overnight fever, nausea or vomiting.  He feels a little better.  Has not had a bowel movement.  Discharge Exam: Vitals:   09/19/18 2115 09/20/18 0537  BP:  134/87  Pulse: (!) 128 (!) 115  Resp: (!) 26   Temp:  98.6 F (37 C)  SpO2: 93% 90%    General: Pt is alert, awake, not in acute  distress Cardiovascular: Tachycardic, S1/S2 + Respiratory: bilateral decreased breath sounds at bases Abdominal: Soft, slightly distended, NT, ND, bowel sounds + Extremities: no edema, no cyanosis    The results of significant diagnostics from this hospitalization (including imaging, microbiology, ancillary and laboratory) are listed below for reference.     Microbiology: Recent Results (from the past 240 hour(s))  Blood culture (routine x 2)     Status: None (Preliminary result)   Collection Time: 09/16/18  6:25 AM  Result Value Ref Range Status   Specimen Description BLOOD LEFT ANTECUBITAL  Final   Special Requests   Final    BOTTLES DRAWN AEROBIC AND ANAEROBIC Blood Culture results may not be optimal due to an excessive volume of blood received in culture bottles   Culture NO GROWTH 4 DAYS  Final   Report Status PENDING  Incomplete  Blood culture (routine x 2)     Status: None (Preliminary result)   Collection Time: 09/16/18  6:35 AM  Result Value Ref Range Status   Specimen Description BLOOD LEFT HAND  Final   Special Requests   Final    BOTTLES DRAWN AEROBIC AND ANAEROBIC Blood Culture adequate volume   Culture NO GROWTH 4 DAYS  Final   Report Status PENDING  Incomplete  Urine Culture     Status: None   Collection Time: 09/16/18  6:35 AM  Result Value Ref Range Status   Specimen Description URINE, RANDOM  Final   Special Requests NONE  Final   Culture   Final    NO GROWTH Performed at [facility], [address] [place], [place] [address]    Report Status 09/17/2018  FINAL  Final  MRSA PCR Screening     Status: None   Collection Time: 09/17/18  9:29 AM  Result Value Ref Range Status   MRSA by PCR NEGATIVE NEGATIVE Final    Comment:        The GeneXpert MRSA Assay (FDA approved for NASAL specimens only), is one component of a comprehensive MRSA colonization surveillance program. It is not intended to diagnose MRSA infection nor to guide or monitor  treatment for MRSA infections. Performed at [facility], [place] [address], [place], [place] [address]      Labs: BNP (last 3 results) Recent Labs    05/07/18 1723  BNP 61.9   Basic Metabolic Panel: Recent Labs  Lab 09/15/18 2256 09/17/18 0427 09/18/18 0428 09/19/18 0350 09/20/18 0401  NA 135 132* 136 136 137  K 3.7 4.1 3.4* 3.4* 3.7  CL 99 100 100 102 103  CO2 24 24 25 25 26   GLUCOSE 143* 130* 98 92 105*  BUN 14 10 10 13 14   CREATININE 0.83 0.91 1.13 1.07 0.97  CALCIUM 9.6 9.0 8.7* 8.6* 8.4*  MG  --   --  1.7 2.0 2.2   Liver Function Tests: Recent Labs  Lab 09/15/18 2256 09/18/18 0428 09/19/18 0350 09/20/18 0401  AST 22 30 25 24   ALT 48* 38 36 41  ALKPHOS 71 196* 239* 215*  BILITOT 1.2 0.7 1.0 0.6  PROT 8.1 6.2* 6.3* 6.2*  ALBUMIN 4.5 3.1* 3.0* 2.9*   No results for input(s): LIPASE, AMYLASE in the last 168 hours. No results for input(s): AMMONIA in the last 168 hours. CBC: Recent Labs  Lab 09/17/18 0427 09/17/18 1020 09/18/18 0428 09/19/18 0350 09/20/18 0401  WBC 8.0 11.0* 35.9* 52.5* 47.9*  NEUTROABS 1.5* 7.6 24.1* 45.6* 31.0*  HGB 10.8* 9.9* 9.8* 10.1* 9.5*  HCT 32.0* 29.9* 28.6* 28.9* 28.4*  MCV 77.9* 78.7* 77.3* 77.5* 78.0*  PLT 70* 66* 77* 89* 86*   Cardiac Enzymes: Recent Labs  Lab 09/18/18 0428  CKTOTAL 14*   BNP: Invalid input(s): POCBNP CBG: No results for input(s): GLUCAP in the last 168 hours. D-Dimer No results for input(s): DDIMER in the last 72 hours. Hgb A1c No results for input(s): HGBA1C in the last 72 hours. Lipid Profile No results for input(s): CHOL, HDL, LDLCALC, TRIG, CHOLHDL, LDLDIRECT in the last 72 hours. Thyroid function studies No results for input(s): TSH, T4TOTAL, T3FREE, THYROIDAB in the last 72 hours.  Invalid input(s): FREET3 Anemia work up No results for input(s): VITAMINB12, FOLATE, FERRITIN, TIBC, IRON, RETICCTPCT in the last 72 hours. Urinalysis    Component Value Date/Time   COLORURINE  YELLOW 09/16/2018 0233   APPEARANCEUR CLEAR 09/16/2018 0233   LABSPEC 1.017 09/16/2018 0233   PHURINE 8.0 09/16/2018 0233   GLUCOSEU NEGATIVE 09/16/2018 0233   HGBUR NEGATIVE 09/16/2018 0233   BILIRUBINUR NEGATIVE 09/16/2018 0233   KETONESUR NEGATIVE 09/16/2018 0233   PROTEINUR NEGATIVE 09/16/2018 0233   NITRITE NEGATIVE 09/16/2018 0233   LEUKOCYTESUR NEGATIVE 09/16/2018 0233   Sepsis Labs Invalid input(s): PROCALCITONIN,  WBC,  LACTICIDVEN Microbiology Recent Results (from the past 240 hour(s))  Blood culture (routine x 2)     Status: None (Preliminary result)   Collection Time: 09/16/18  6:25 AM  Result Value Ref Range Status   Specimen Description BLOOD LEFT ANTECUBITAL  Final   Special Requests   Final    BOTTLES DRAWN AEROBIC AND ANAEROBIC Blood Culture results may not be optimal due to an excessive  volume of blood received in culture bottles   Culture NO GROWTH 4 DAYS  Final   Report Status PENDING  Incomplete  Blood culture (routine x 2)     Status: None (Preliminary result)   Collection Time: 09/16/18  6:35 AM  Result Value Ref Range Status   Specimen Description BLOOD LEFT HAND  Final   Special Requests   Final    BOTTLES DRAWN AEROBIC AND ANAEROBIC Blood Culture adequate volume   Culture NO GROWTH 4 DAYS  Final   Report Status PENDING  Incomplete  Urine Culture     Status: None   Collection Time: 09/16/18  6:35 AM  Result Value Ref Range Status   Specimen Description URINE, RANDOM  Final   Special Requests NONE  Final   Culture   Final    NO GROWTH Performed at [facility], [place] [address] [place], [place] [address]    Report Status 09/17/2018 FINAL  Final  MRSA PCR Screening     Status: None   Collection Time: 09/17/18  9:29 AM  Result Value Ref Range Status   MRSA by PCR NEGATIVE NEGATIVE Final    Comment:        The GeneXpert MRSA Assay (FDA approved for NASAL specimens only), is one component of a comprehensive MRSA colonization surveillance  program. It is not intended to diagnose MRSA infection nor to guide or monitor treatment for MRSA infections. Performed at [facility], [place] [address], [place], [place] [address]      Time coordinating discharge: 35 minutes  SIGNED:   Aline August, MD  [facility] 09/20/2018, 12:38 PM

## 2018-09-20 NOTE — Progress Notes (Signed)
Pharmacy Antibiotic Note  David Lam is a 23 y.o. male admitted on 09/15/2018 with febrile neutropenia.    Pt was started on vanc/cefepime before optimizing to cefepime by itself. Fever has resolved. He will be discharged today.   Plan: Cefepime 2g IV q8h  Height: 5\' 8"  (172.7 cm) Weight: 186 lb (84.4 kg) IBW/kg (Calculated) : 68.4  Temp (24hrs), Avg:98.4 F (36.9 C), Min:98 F (36.7 C), Max:98.6 F (37 C)  Recent Labs  Lab 09/15/18 2256 09/16/18 6720 09/16/18 0831 09/16/18 2200 09/17/18 0427 09/17/18 1020 09/18/18 0428 09/19/18 0350 09/20/18 0401  WBC 0.7*  --   --   --  8.0 11.0* 35.9* 52.5* 47.9*  CREATININE 0.83  --   --   --  0.91  --  1.13 1.07 0.97  LATICACIDVEN  --  3.5* 3.9* 2.3*  --   --  1.9  --   --     Estimated Creatinine Clearance: 124.2 mL/min (by C-G formula based on SCr of 0.97 mg/dL).    No Known Allergies  Antimicrobials this admission: 2/24 vancomycin >> 2/26 2/24 cefepime >>   Dose adjustments this admission:   Microbiology results: 2/24 blood>>ngtd 2/25 MRSA>>neg 2/24 urine>>neg  Onnie Boer, PharmD, [place], AAHIVP, CPP Infectious Disease Pharmacist 09/20/2018 1:53 PM

## 2018-09-21 LAB — CULTURE, BLOOD (ROUTINE X 2)
Culture: NO GROWTH
Culture: NO GROWTH
SPECIAL REQUESTS: ADEQUATE

## 2018-09-21 NOTE — Progress Notes (Signed)
Subjective:  Patient ID: David Lam, male    DOB: 07-25-1995,  MRN: [id number]  Chief Complaint  Patient presents with  . Nail Problem    toenail is discolored   23 y.o. male presents with the above complaint.   Review of Systems: Negative except as noted in the HPI. Denies N/V/F/Ch.  Past Medical History:  Diagnosis Date  . Non Hodgkin's lymphoma ([place])   . Non Hodgkin's lymphoma ([place])     Current Outpatient Medications:  .  bisacodyl (DULCOLAX) 10 MG suppository, Place 1 suppository (10 mg total) rectally daily as needed for moderate constipation., Disp: 12 suppository, Rfl: 0 .  diphenhydrAMINE (BENADRYL) 25 mg capsule, Take 25 mg by mouth every 6 (six) hours as needed for allergies. , Disp: , Rfl:  .  HYDROcodone-acetaminophen (NORCO) 7.5-325 MG tablet, Take 1 tablet by mouth every 6 (six) hours as needed for moderate pain., Disp: 14 tablet, Rfl: 0 .  lidocaine-prilocaine (EMLA) cream, Apply 1 application topically as needed. (Patient taking differently: Apply 1 application topically as needed (port access). ), Disp: 30 g, Rfl: 0 .  LORazepam (ATIVAN) 0.5 MG tablet, Take 1 tablet (0.5 mg total) by mouth every 6 (six) hours as needed (Nausea or vomiting)., Disp: 30 tablet, Rfl: 0 .  methocarbamol (ROBAXIN) 500 MG tablet, Take 1 tablet (500 mg total) by mouth every 6 (six) hours as needed for muscle spasms., Disp: 30 tablet, Rfl: 0 .  ondansetron (ZOFRAN) 8 MG tablet, Take 1 tablet (8 mg total) by mouth 2 (two) times daily as needed. Start on the third day after doxorubicin/cytoxan chemotherapy., Disp: 30 tablet, Rfl: 1 .  polyethylene glycol (MIRALAX / GLYCOLAX) packet, Take 17 g by mouth daily., Disp: 14 each, Rfl: 0 .  procarbazine (MATULANE) 50 MG capsule, Take 4 capsules (200 mg total) by mouth daily. Take daily on days 1-7 of each 21 day cycle. Follow low tyramine diet., Disp: 28 capsule, Rfl: 2 .  prochlorperazine (COMPAZINE) 10 MG tablet, Take 1 tablet (10 mg total) by mouth  every 6 (six) hours as needed for nausea or vomiting., Disp: 30 tablet, Rfl: 1 .  Rivaroxaban 15 & 20 MG TBPK, Take as directed on package: Start with one 15mg  tablet by mouth twice a day with food. On Day 22, switch to one 20mg  tablet once a day with food., Disp: 51 each, Rfl: 0 .  senna-docusate (SENOKOT-S) 8.6-50 MG tablet, Take 1 tablet by mouth 2 (two) times daily., Disp: 20 tablet, Rfl: 0  Social History   Tobacco Use  Smoking Status Never Smoker  Smokeless Tobacco Never Used    No Known Allergies Objective:   Vitals:   06/28/18 1255  BP: (!) 117/56  Pulse: (!) 106   There is no height or weight on file to calculate BMI. Constitutional Well developed. Well nourished.  Vascular Dorsalis pedis pulses palpable bilaterally. Posterior tibial pulses palpable bilaterally. Capillary refill normal to all digits.  No cyanosis or clubbing noted. Pedal hair growth normal.  Neurologic Normal speech. Oriented to person, place, and time. Epicritic sensation to light touch grossly present bilaterally.  Dermatologic Thickened and loose left hallux nail No other open wounds. No skin lesions.  Orthopedic: Normal joint ROM without pain or crepitus bilaterally. No visible deformities. No bony tenderness.   Radiographs: None Assessment:   1. Ingrown nail   2. Pain around toenail    Plan:  Patient was evaluated and treated and all questions answered.  Ingrown Nail, left -Patient elects to  proceed with minor surgery to remove ingrown toenail removal today. Consent reviewed and signed by patient. -Ingrown nail excised. See procedure note. -Educated on post-procedure care including soaking. Written instructions provided and reviewed. -Patient to follow up in 2 weeks for nail check.  Procedure: Avulsion of toenail Location: Left 1st  Anesthesia: Lidocaine 1% plain; 1.5 mL and Marcaine 0.5% plain; 1.5 mL, digital block. Skin Prep: Betadine. Dressing: Silvadene; telfa; dry, sterile,  compression dressing. Technique: Following skin prep, the toe was exsanguinated and a tourniquet was secured at the base of the toe. The nail was freed and avulsed with a hemostat. The area was cleansed. The tourniquet was then removed and sterile dressing applied. Disposition: Patient tolerated procedure well.    Return in about 2 weeks (around 07/12/2018) for nail check left.

## 2018-09-23 ENCOUNTER — Telehealth: Payer: Self-pay | Admitting: *Deleted

## 2018-09-23 NOTE — Telephone Encounter (Signed)
Received faxed Telephone Advice fax from [facility]; patient called 3/1 @ 11:58am to ask if can take Ibuprofen in conjunction with other medication prescribed last week for back pain - muscle relaxer and percocet. Contacted patient 3/2 AM:. Patient asked about taking tylenol and ibuprofen with current meds (Robaxin/Norco). Per Dr. Irene Limbo - patient should take no more than 2 Ibuprofen or 2 tylenol in 24 hours with current meds prescribed. Patient states pain is 7/10 before meds and 4/10 after medication. Encouraged patient to stay hydrated. States he tried a heating pad too, but it did not reduce pain. Advised patient to notify office if pain is no better or if it gets worse. Patient verbalized understanding. He has appt with Dr. Irene Limbo on Wednesday 3/4. Marland Kitchen

## 2018-09-23 NOTE — Telephone Encounter (Signed)
"  David Lam [phone]), my sister [name] [phone]).  Low back pain started a week ago.  No back injury.  Norco 7.5 - 325 mg one every six hours and Robaxin 500 mg one every six hours ordered.  Taking both at same time helps but now I spread out three hours between the two.  Can I use Tylenol or Ibuprofen?  Working today at 9:00 am.  What can I take that may work better and not make me sleepy?  Took pain pill at 5:00 am.  Pain level seven right now.  Call me before 9:00 am or my sister after I go to work."

## 2018-09-24 NOTE — Progress Notes (Signed)
HEMATOLOGY/ONCOLOGY CLINIC NOTE  Date of Service: 09/25/18     Patient Care Team: Patient, No Pcp Per as PCP - General (General Practice)  CHIEF COMPLAINTS/PURPOSE OF CONSULTATION:  Nodular sclerosis Hodgkin's Lymphoma   HISTORY OF PRESENTING ILLNESS:   David Lam is a wonderful 23 y.o. male who has been referred to [place] by Dr. 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 He is accompanied today by his mother and sister. The pt reports that he is doing well overall.   The pt presented to the ED on 05/07/18 after developing chest pain over 2 days, with positional elements. The pt was evaluated with a CXR which revealed a mediastinal mass that was biopsied on 05/10/18 and confirmed nodular sclerosis Hodgkin's lymphoma, as noted below.   The pt notes that he though his chest discomfort was due to working out, and denies noticing any swelling in the area. The pt denies any recent fevers, chills, night sweats, skin rashes, unexpected weight loss. The pt notes that his chest pain continues and is making his sleeping difficult. He denies any difficulty ambulating or SOB.   The pt reports that he has not had any prior medical problems, and has had left thumb and left ankle surgeries from injuries. 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   Of note prior to the patient's visit today, pt has had a [place] lymph node biopsy completed on 05/10/18 with results revealing Nodular sclerosis Hodgkin's Lymphoma    The pt also had a CTA Chest on 05/07/18 which revealed Bulky, bilateral anterior superior mediastinal confluent soft tissue masses which are not calcified in appearance and which do not appear to cause occlusion or significant luminal narrowing of traversing vasculature. Leading consideration is lymphoma, possibly Hodgkin's. Given lack of differentiating soft tissue densities, a teratoma is believed less likely. Nonseminomatous germ cell tumor  given presence of small effusions might also be within differential though no pulmonary lesions are visualized. Metastatic disease, infection or inflammatory process are believed less likely.  The pt also had a CT A/P on 05/08/18 which revealed No acute finding or evidence of malignancy in the abdomen.  Most recent lab results (05/09/18) of CBC is as follows: all values are WNL except for WBC at 10.6k, HGB at 11.9, HCT at 34.7, MCV at 78.5, ANC at 7.9k, Eosinophils abs at 600. 05/08/18 Sed Rate at 19  On review of systems, pt reports chest pain, moving his bowels well, and denies skin rashes, fevers, chills, night sweats, unexpected weight loss, SOB, headaches, changes in vision, urinary pain or discomfort, abdominal pains, testicular pain or swelling, leg swelling, arm swelling, and any other symptoms.   On PMHx the pt reports left thumb and left ankle surgeries, denies blood transfusions. On Social Hx the pt denies alcohol consumption and denies smoking cigarettes or other recreational drugs. 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 Denies tattoos.  On Family Hx the pt denies any blood disorders or cancers.  He denies any medications.  Interval History:   [name] returns today for management, evaluation and after C1 Escalated BEACOPP treatment of his Classical Hodgkin's Lymphoma. The patient's last visit with [place] was on 09/11/18. He is accompanied today by his sister. The pt reports that he is doing well overall.   The pt reports that he had sudden onset of pain in his lower back while lying down last week, for which he was admitted to the hospital and was  evaluated with MRI imaging, as noted below, which were notable for L5-S1 herniated disc. There pt denies any trauma or concern for stressed movement prior to this. He notes that he has been hesitant to use the pain medication he was prescribed because it makes him sleepy, but notes that the Domino successfully addresses his  pain. He will be attending PT for this. He is also taking Miralax and is moving his bowels. He notes that he is able to walk but has a sense of pain in his inner leg.  The pt also notes a muscle in his abdomen which bulges when he lies down flat on his bask.  The pt notes that his appetite has been a little lower.   The pt notes that he has been having night sweats since he left the hospital, but denies any fevers.  Of note since the patient's last visit, pt has had MRI Thoracic and Lumbar Spine completed on 09/16/18 with results revealing MRI thoracic spine: 1. Motion degradation of axial sequences. 2. No abnormal osseous or cord signal abnormality. 3. No significant disc displacement, foraminal stenosis, or canal stenosis. MRI lumbar spine: 1. No abnormal osseous or cord signal abnormality. 2. L5-S1 central and left subarticular disc protrusion with annular fissure contacting the descending S1 nerve roots in the lateral recesses as well as resulting in mild bilateral foraminal stenosis and spinal canal stenosis.  Lab results today (09/25/18) of CBC w/diff and CMP is as follows: all values are WNL except for WBC at 22.2k, RBC at 4.10, HGB at 10.9, HCT at 31.5, MCV at 76.8, RDW at 16.6, ANC at 18.5k, Monocytes abs at 1.6k, Chloride at 97, Glucose at 113, Albumin at 3.3, AST at 42, ALT at 85, Alk Phos at 202.  On review of systems, pt reports lower back pain, inner leg pain, stable energy levels, and denies mouth sores, fevers, diarrhea, discomfort passing urine, cough, SOB, and any other symptoms.  MEDICAL HISTORY:  Past Medical History:  Diagnosis Date  . Non Hodgkin's lymphoma ([place])   . Non Hodgkin's lymphoma ([place])     SURGICAL HISTORY: Past Surgical History:  Procedure Laterality Date  . ANKLE SURGERY Left    cleaned up   . FRACTURE SURGERY Left    arm  . IR IMAGING GUIDED PORT INSERTION  06/03/2018  . MEDIASTINOTOMY CHAMBERLAIN MCNEIL N/A 05/10/2018   Procedure: Mediastinal mass  biopsy ;  Surgeon: Grace Isaac, MD;  Location: [facility] OR;  Service: Thoracic;  Laterality: N/A;  . ORIF FINGER / THUMB FRACTURE Left     SOCIAL HISTORY: Social History   Socioeconomic History  . Marital status: Single    Spouse name: Not on file  . Number of children: Not on file  . Years of education: Not on file  . Highest education level: Not on file  Occupational History  . Not on file  Social Needs  . Financial resource strain: Not on file  . Food insecurity:    Worry: Not on file    Inability: Not on file  . Transportation needs:    Medical: Not on file    Non-medical: Not on file  Tobacco Use  . Smoking status: Never Smoker  . Smokeless tobacco: Never Used  Substance and Sexual Activity  . Alcohol use: Never    Frequency: Never  . Drug use: Never  . Sexual activity: Not on file  Lifestyle  . Physical activity:    Days per week: Not on file  Minutes per session: Not on file  . Stress: Not on file  Relationships  . Social connections:    Talks on phone: Not on file    Gets together: Not on file    Attends religious service: Not on file    Active member of club or organization: Not on file    Attends meetings of clubs or organizations: Not on file    Relationship status: Not on file  . Intimate partner violence:    Fear of current or ex partner: Not on file    Emotionally abused: Not on file    Physically abused: Not on file    Forced sexual activity: Not on file  Other Topics Concern  . Not on file  Social History Narrative  . Not on file    FAMILY HISTORY: No family history on file.  ALLERGIES:  has No Known Allergies.  MEDICATIONS:  Current Outpatient Medications  Medication Sig Dispense Refill  . bisacodyl (DULCOLAX) 10 MG suppository Place 1 suppository (10 mg total) rectally daily as needed for moderate constipation. 12 suppository 0  . diphenhydrAMINE (BENADRYL) 25 mg capsule Take 25 mg by mouth every 6 (six) hours as needed for  allergies.     Marland Kitchen HYDROcodone-acetaminophen (NORCO) 7.5-325 MG tablet Take 1 tablet by mouth every 6 (six) hours as needed for moderate pain. 14 tablet 0  . lidocaine-prilocaine (EMLA) cream Apply 1 application topically as needed. (Patient taking differently: Apply 1 application topically as needed (port access). ) 30 g 0  . LORazepam (ATIVAN) 0.5 MG tablet Take 1 tablet (0.5 mg total) by mouth every 6 (six) hours as needed (Nausea or vomiting). 30 tablet 0  . methocarbamol (ROBAXIN) 500 MG tablet Take 1 tablet (500 mg total) by mouth every 6 (six) hours as needed for muscle spasms. 30 tablet 0  . ondansetron (ZOFRAN) 8 MG tablet Take 1 tablet (8 mg total) by mouth 2 (two) times daily as needed. Start on the third day after doxorubicin/cytoxan chemotherapy. 30 tablet 1  . polyethylene glycol (MIRALAX / GLYCOLAX) packet Take 17 g by mouth daily. 14 each 0  . procarbazine (MATULANE) 50 MG capsule Take 4 capsules (200 mg total) by mouth daily. Take daily on days 1-7 of each 21 day cycle. Follow low tyramine diet. 28 capsule 2  . prochlorperazine (COMPAZINE) 10 MG tablet Take 1 tablet (10 mg total) by mouth every 6 (six) hours as needed for nausea or vomiting. 30 tablet 1  . Rivaroxaban 15 & 20 MG TBPK Take as directed on package: Start with one 56m tablet by mouth twice a day with food. On Day 22, switch to one 2103mtablet once a day with food. 51 each 0  . senna-docusate (SENOKOT-S) 8.6-50 MG tablet Take 1 tablet by mouth 2 (two) times daily. 20 tablet 0   No current facility-administered medications for this visit.     REVIEW OF SYSTEMS:    A 10+ POINT REVIEW OF SYSTEMS WAS OBTAINED including neurology, dermatology, psychiatry, cardiac, respiratory, lymph, extremities, GI, GU, Musculoskeletal, constitutional, breasts, reproductive, HEENT.  All pertinent positives are noted in the HPI.  All others are negative.   PHYSICAL EXAMINATION: ECOG PERFORMANCE STATUS: 1 - Symptomatic but completely  ambulatory  . Vitals:   09/25/18 0954  BP: 132/77  Pulse: (!) 112  Resp: 18  Temp: 98.6 F (37 C)  SpO2: 100%   Filed Weights   09/25/18 0954  Weight: 183 lb 6.4 oz (83.2 kg)   .Body  mass index is 27.89 kg/m.  GENERAL:alert, in no acute distress and comfortable SKIN: no acute rashes, no significant lesions EYES: conjunctiva are pink and non-injected, sclera anicteric OROPHARYNX: MMM, no exudates, no oropharyngeal erythema or ulceration NECK: supple, no JVD LYMPH:  no palpable lymphadenopathy in the cervical, axillary or inguinal regions LUNGS: clear to auscultation b/l with normal respiratory effort HEART: regular rate & rhythm ABDOMEN:  normoactive bowel sounds , non tender, not distended. No palpable hepatosplenomegaly.  Extremity: no pedal edema PSYCH: alert & oriented x 3 with fluent speech NEURO: no focal motor/sensory deficits   LABORATORY DATA:  I have reviewed the data as listed  . CBC Latest Ref Rng & Units 09/25/2018 09/20/2018 09/19/2018  WBC 4.0 - 10.5 K/uL 22.2(H) 47.9(H) 52.5(HH)  Hemoglobin 13.0 - 17.0 g/dL 10.9(L) 9.5(L) 10.1(L)  Hematocrit 39.0 - 52.0 % 31.5(L) 28.4(L) 28.9(L)  Platelets 150 - 400 K/uL 245 86(L) 89(L)    . CMP Latest Ref Rng & Units 09/25/2018 09/20/2018 09/19/2018  Glucose 70 - 99 mg/dL 113(H) 105(H) 92  BUN 6 - 20 mg/dL '17 14 13  ' Creatinine 0.61 - 1.24 mg/dL 1.06 0.97 1.07  Sodium 135 - 145 mmol/L 135 137 136  Potassium 3.5 - 5.1 mmol/L 4.2 3.7 3.4(L)  Chloride 98 - 111 mmol/L 97(L) 103 102  CO2 22 - 32 mmol/L '25 26 25  ' Calcium 8.9 - 10.3 mg/dL 9.6 8.4(L) 8.6(L)  Total Protein 6.5 - 8.1 g/dL 8.1 6.2(L) 6.3(L)  Total Bilirubin 0.3 - 1.2 mg/dL 0.5 0.6 1.0  Alkaline Phos 38 - 126 U/L 202(H) 215(H) 239(H)  AST 15 - 41 U/L 42(H) 24 25  ALT 0 - 44 U/L 85(H) 41 36   05/10/18 Biopsy:   05/22/18 BM Bx:    RADIOGRAPHIC STUDIES: I have personally reviewed the radiological images as listed and agreed with the findings in the  report. Dg Chest 2 View  Result Date: 09/04/2018 CLINICAL DATA:  Chest pain, history of Hodgkin's lymphoma EXAM: CHEST - 2 VIEW COMPARISON:  08/16/2018 PET-CT FINDINGS: Cardiac shadow is within normal limits. Increased soft tissue density is along the right hilum and right paratracheal region as well as the superior mediastinum on the left consistent with the previously seen lymph nodes. Lungs are clear. No bony abnormality is noted. Right chest wall port is seen. IMPRESSION: Changes consistent with mediastinal adenopathy similar to that seen on prior PET-CT. No acute abnormality is noted. Electronically Signed   By: Inez Catalina M.D.   On: 09/04/2018 02:32   Dg Abd 1 View  Result Date: 09/19/2018 CLINICAL DATA:  Abdominal distension. EXAM: ABDOMEN - 1 VIEW COMPARISON:  None. FINDINGS: The bowel gas pattern is normal. No radio-opaque calculi or other significant radiographic abnormality are seen. IMPRESSION: Negative.  Extensive bowel content is identified in the right colon. Electronically Signed   By: Abelardo Diesel M.D.   On: 09/19/2018 12:47   Ct Angio Chest Pe W And/or Wo Contrast  Result Date: 09/04/2018 CLINICAL DATA:  Chest pain. EXAM: CT ANGIOGRAPHY CHEST WITH CONTRAST TECHNIQUE: Multidetector CT imaging of the chest was performed using the standard protocol during bolus administration of intravenous contrast. Multiplanar CT image reconstructions and MIPs were obtained to evaluate the vascular anatomy. CONTRAST:  52m ISOVUE-370 IOPAMIDOL (ISOVUE-370) INJECTION 76% COMPARISON:  08/16/2018 PET-CT FINDINGS: Cardiovascular: 2 branching central pulmonary filling defects seen in subsegmental right upper lobe branches. No additional acute pulmonary embolism is seen. Normal heart size. No pericardial effusion. Porta catheter on the right with  tip at the upper right atrium. 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 Lungs/Pleura: Generalized airway thickening and mosaic  lung attenuation. This is likely accentuated by expiratory imaging. Similar findings were seen on 2020 PET-CT, although better inflated at that time. Even though there is acute pulmonary embolism this is most likely due to small airways disease given the extent greater than visible PE and the airway thickening. Upper Abdomen: Negative Musculoskeletal: Negative Critical Value/emergent results were called by telephone at the time of interpretation on 09/04/2018 at 6:19 am to Dr. Addison Lank , who verbally acknowledged these results. Review of the MIP images confirms the above findings. IMPRESSION: 1. Positive for 2 acute subsegmental pulmonary emboli in the right upper lobe. 2. Heterogeneous aeration diffusely, likely small airways disease with air trapping given there is also generalized bronchial wall thickening. 3. 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 Electronically Signed   By: Monte Fantasia M.D.   On: 09/04/2018 06:20   Mr Thoracic Spine W Wo Contrast  Result Date: 09/16/2018 CLINICAL DATA:  23 y/o M; sudden onset severe lower back pain and bilateral leg weakness. History of non-Hodgkin's lymphoma. EXAM: MRI THORACIC AND LUMBAR SPINE WITHOUT AND WITH CONTRAST TECHNIQUE: Multiplanar and multiecho pulse sequences of the thoracic and lumbar spine were obtained without and with intravenous contrast. CONTRAST:  8 cc Gadavist COMPARISON:  09/04/2018 CT chest FINDINGS: MRI THORACIC SPINE FINDINGS Motion degradation of axial sequences. Alignment:  Physiologic. Vertebrae: No fracture, evidence of discitis, or bone lesion. No abnormal enhancement. Cord:  Normal signal and morphology.  No abnormal enhancement. Paraspinal and other soft tissues: Negative. Disc levels: No significant disc displacement, foraminal stenosis, or canal stenosis. MRI LUMBAR SPINE FINDINGS Segmentation:  Standard. Alignment:  Physiologic. Vertebrae: No fracture, evidence of discitis, or bone lesion. No abnormal enhancement.  Conus medullaris: Extends to the L1 level and appears normal. No abnormal enhancement. Paraspinal and other soft tissues: Right kidney lower pole 16 mm cyst. Disc levels: L1-2: No significant disc displacement, foraminal stenosis, or canal stenosis. L2-3: No significant disc displacement, foraminal stenosis, or canal stenosis. L3-4: No significant disc displacement, foraminal stenosis, or canal stenosis. L4-5: No significant disc displacement, foraminal stenosis, or canal stenosis. L5-S1: Central and left subarticular disc protrusion with annular fissure contacting the descending S1 nerve roots in the lateral recesses bilaterally. Mild bilateral foraminal stenosis and spinal canal stenosis. IMPRESSION: MRI thoracic spine: 1. Motion degradation of axial sequences. 2. No abnormal osseous or cord signal abnormality. 3. No significant disc displacement, foraminal stenosis, or canal stenosis. MRI lumbar spine: 1. No abnormal osseous or cord signal abnormality. 2. L5-S1 central and left subarticular disc protrusion with annular fissure contacting the descending S1 nerve roots in the lateral recesses as well as resulting in mild bilateral foraminal stenosis and spinal canal stenosis. Electronically Signed   By: Kristine Garbe M.D.   On: 09/16/2018 05:40   Mr Lumbar Spine W Wo Contrast  Result Date: 09/16/2018 CLINICAL DATA:  23 y/o M; sudden onset severe lower back pain and bilateral leg weakness. History of non-Hodgkin's lymphoma. EXAM: MRI THORACIC AND LUMBAR SPINE WITHOUT AND WITH CONTRAST TECHNIQUE: Multiplanar and multiecho pulse sequences of the thoracic and lumbar spine were obtained without and with intravenous contrast. CONTRAST:  8 cc Gadavist COMPARISON:  09/04/2018 CT chest FINDINGS: MRI THORACIC SPINE FINDINGS Motion degradation of axial sequences. Alignment:  Physiologic. Vertebrae: No fracture, evidence of discitis, or bone lesion. No abnormal enhancement. Cord:  Normal signal and morphology.  No abnormal enhancement. Paraspinal and other soft tissues: Negative. Disc levels: No significant disc displacement, foraminal stenosis, or canal stenosis. MRI LUMBAR SPINE FINDINGS Segmentation:  Standard. Alignment:  Physiologic. Vertebrae: No fracture, evidence of discitis, or bone lesion. No abnormal enhancement. Conus medullaris: Extends to the L1 level and appears normal. No abnormal enhancement. Paraspinal and other soft tissues: Right kidney lower pole 16 mm cyst. Disc levels: L1-2: No significant disc displacement, foraminal stenosis, or canal stenosis. L2-3: No significant disc displacement, foraminal stenosis, or canal stenosis. L3-4: No significant disc displacement, foraminal stenosis, or canal stenosis. L4-5: No significant disc displacement, foraminal stenosis, or canal stenosis. L5-S1: Central and left subarticular disc protrusion with annular fissure contacting the descending S1 nerve roots in the lateral recesses bilaterally. Mild bilateral foraminal stenosis and spinal canal stenosis. IMPRESSION: MRI thoracic spine: 1. Motion degradation of axial sequences. 2. No abnormal osseous or cord signal abnormality. 3. No significant disc displacement, foraminal stenosis, or canal stenosis. MRI lumbar spine: 1. No abnormal osseous or cord signal abnormality. 2. L5-S1 central and left subarticular disc protrusion with annular fissure contacting the descending S1 nerve roots in the lateral recesses as well as resulting in mild bilateral foraminal stenosis and spinal canal stenosis. Electronically Signed   By: Kristine Garbe M.D.   On: 09/16/2018 05:40   Dg Chest Portable 1 View  Result Date: 09/16/2018 CLINICAL DATA:  History of non-Hodgkin's lymphoma with back pain, initial encounter EXAM: PORTABLE CHEST 1 VIEW COMPARISON:  09/04/2018 FINDINGS: Cardiac shadow is stable. Right chest wall port is again identified and stable. Lungs are well aerated bilaterally. No focal infiltrate or sizable  effusion is seen. Previously seen lymphadenopathy is stable given some slight variation in patient positioning. No bony abnormality is noted. IMPRESSION: No acute abnormality noted. Stable changes of lymphadenopathy consistent with the given clinical history. Electronically Signed   By: Inez Catalina M.D.   On: 09/16/2018 07:03   Vas [place] Lower Extremity Venous (dvt)  Result Date: 09/12/2018  Lower Venous Study Indications: Pulmonary embolism.  Performing Technologist: Abram Sander RVS  Examination Guidelines: A complete evaluation includes B-mode imaging, spectral Doppler, color Doppler, and power Doppler as needed of all accessible portions of each vessel. Bilateral testing is considered an integral part of a complete examination. Limited examinations for reoccurring indications may be performed as noted.  Right Venous Findings: +---------+---------------+---------+-----------+----------+-------+          CompressibilityPhasicitySpontaneityPropertiesSummary +---------+---------------+---------+-----------+----------+-------+ CFV      Full           Yes      Yes                          +---------+---------------+---------+-----------+----------+-------+ SFJ      Full                                                 +---------+---------------+---------+-----------+----------+-------+ FV Prox  Full                                                 +---------+---------------+---------+-----------+----------+-------+ FV Mid   Full                                                 +---------+---------------+---------+-----------+----------+-------+  FV DistalFull                                                 +---------+---------------+---------+-----------+----------+-------+ PFV      Full                                                 +---------+---------------+---------+-----------+----------+-------+ POP      Full           Yes      Yes                           +---------+---------------+---------+-----------+----------+-------+ PTV      Full                                                 +---------+---------------+---------+-----------+----------+-------+ PERO     Full                                                 +---------+---------------+---------+-----------+----------+-------+  Left Venous Findings: +---------+---------------+---------+-----------+----------+-------+          CompressibilityPhasicitySpontaneityPropertiesSummary +---------+---------------+---------+-----------+----------+-------+ CFV      Full           Yes      Yes                          +---------+---------------+---------+-----------+----------+-------+ SFJ      Full                                                 +---------+---------------+---------+-----------+----------+-------+ FV Prox  Full                                                 +---------+---------------+---------+-----------+----------+-------+ FV Mid   Full                                                 +---------+---------------+---------+-----------+----------+-------+ FV DistalFull                                                 +---------+---------------+---------+-----------+----------+-------+ PFV      Full                                                 +---------+---------------+---------+-----------+----------+-------+  POP      Full           Yes      Yes                          +---------+---------------+---------+-----------+----------+-------+ PTV      Full                                                 +---------+---------------+---------+-----------+----------+-------+ PERO     Full                                                 +---------+---------------+---------+-----------+----------+-------+    Summary: Right: There is no evidence of deep vein thrombosis in the lower extremity. No cystic structure found in the popliteal  fossa. Left: There is no evidence of deep vein thrombosis in the lower extremity. No cystic structure found in the popliteal fossa.  *See table(s) above for measurements and observations. Electronically signed by Harold Barban MD on 09/12/2018 at 4:09:45 PM.    Final     ASSESSMENT & PLAN:   23 y.o. male with  1. 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 Leading consideration is lymphoma, possibly Hodgkin's. Given lack of differentiating soft tissue densities, a teratoma is believed less likely. Nonseminomatous germ cell tumor given presence of small effusions might also be within differential though no pulmonary lesions are visualized. Metastatic disease, infection or inflammatory process are believed less likely.    05/08/18 CT A/P revealed No acute finding or evidence of malignancy in the abdomen.  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 No hypermetabolic lymph nodes within the neck, abdomen or pelvis.  05/22/18 BM Bx was negative   S/p 3 cycles of ABVD  08/16/18 PET/CT revealed Since the prior PET of 05/21/2018, mild disease progression, as evidenced by increased activity and less so size of hypermetabolic thoracic nodes. (Deauville 4) 2. No new or extrathoracic sites of disease. 3. Significant hypermetabolic "brown" fat throughout the neck and chest, mildly degrading evaluation. Discussed the 08/16/18 PET/CT with radiology - confirmed concern  for residual Deauville 4 disease   2. B/L great toe subungal hematomas - likely from chemotherapy -podiatry referral given -Patient saw Podiatry, and toe nail was removed -Continue Epsom salt soaks   3. Pulmonary Embolism 09/04/18 CTA Chest revealed Positive for 2 acute subsegmental pulmonary emboli in the right upper lobe. 2. Heterogeneous aeration diffusely, likely small airways disease with air trapping given there is also generalized bronchial wall thickening. 3. 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 Tumor as risk factor for PE  PLAN: -Discussed pt labwork today, 09/25/18; ANC at 18.5k in setting of G-CSF support, HGB improved to 10.9, PLT  normalized to 245k -The pt has no prohibitive toxicities from continuing C2 escalated BEACOPP at this time.  -Regarding herniated disc: Pain management with local lidocaine patch, Diclofenac gel, and Norco, local heat application, outpatient physical therapy referral, and sleeping with C shaped pillow. Also recommend lower back belt if needed. -Discussed strict fever precautions and that the pt should seek medical attention if he develops a fever of 100.4 or greater -Recommend prophylactic Claritin up to one week prior to Neulasta, and additional week after Neulasta, with each cycle. -Continue Senna S and Miralax -Will refill Norco, and Robaxin only as needed -Continue 34m Xarelto -Pt will avoid NSAIDs -Will complete 2 cycles of escalated BEACOPP, then will repeat PET/CT to gauge response and determine additional cycle(s) vs beginning RT -Continue Vitamin B complex -Recommend salt and baking soda mouthwashes to prevent mouth sores -Will see the pt back in 1 week for toxicity check   -Referral to rehab center for PT for fatigue and low back pain from herniated disc L5-S1 -proceed with C2 Escalated Beacopp as scheduled  -RTC with Dr KIrene Limbowith labs on C2D8 in 1 week -PET/CT in 18 days   All of the patients questions were answered  with apparent satisfaction. The patient knows to call the clinic with any problems, questions or concerns.  The total time spent in the appt was 35 minutes and more than 50% was on counseling and direct patient cares.    GSullivan LoneMD MS AAHIVMS SCornerstone Special[facility] ShawneeCHealthpark Medical CenterHematology/Oncology Physician [facility] (Office):       [phone](Work cell):  [phone](Fax):           [phone] 09/25/2018 10:47 AM   I, SBaldwin Jamaica am acting as a scribe for Dr. GSullivan Lone   .I have reviewed the above documentation for accuracy and completeness, and I agree with the above. .Brunetta GeneraMD

## 2018-09-25 ENCOUNTER — Inpatient Hospital Stay: Payer: BLUE CROSS/BLUE SHIELD | Attending: Hematology

## 2018-09-25 ENCOUNTER — Inpatient Hospital Stay: Payer: BLUE CROSS/BLUE SHIELD

## 2018-09-25 ENCOUNTER — Inpatient Hospital Stay (HOSPITAL_BASED_OUTPATIENT_CLINIC_OR_DEPARTMENT_OTHER): Payer: BLUE CROSS/BLUE SHIELD | Admitting: Hematology

## 2018-09-25 ENCOUNTER — Other Ambulatory Visit: Payer: Self-pay | Admitting: Hematology

## 2018-09-25 ENCOUNTER — Telehealth: Payer: Self-pay | Admitting: Pharmacist

## 2018-09-25 ENCOUNTER — Telehealth: Payer: Self-pay | Admitting: Hematology

## 2018-09-25 VITALS — BP 132/77 | HR 112 | Temp 98.6°F | Resp 18 | Ht 68.0 in | Wt 183.4 lb

## 2018-09-25 DIAGNOSIS — C8110 Nodular sclerosis classical Hodgkin lymphoma, unspecified site: Secondary | ICD-10-CM

## 2018-09-25 DIAGNOSIS — Z5189 Encounter for other specified aftercare: Secondary | ICD-10-CM | POA: Insufficient documentation

## 2018-09-25 DIAGNOSIS — C8118 Nodular sclerosis classical Hodgkin lymphoma, lymph nodes of multiple sites: Secondary | ICD-10-CM

## 2018-09-25 DIAGNOSIS — M5126 Other intervertebral disc displacement, lumbar region: Secondary | ICD-10-CM | POA: Diagnosis not present

## 2018-09-25 DIAGNOSIS — Z5111 Encounter for antineoplastic chemotherapy: Secondary | ICD-10-CM | POA: Diagnosis not present

## 2018-09-25 DIAGNOSIS — D709 Neutropenia, unspecified: Secondary | ICD-10-CM | POA: Diagnosis not present

## 2018-09-25 DIAGNOSIS — I2699 Other pulmonary embolism without acute cor pulmonale: Secondary | ICD-10-CM

## 2018-09-25 DIAGNOSIS — D72819 Decreased white blood cell count, unspecified: Secondary | ICD-10-CM | POA: Insufficient documentation

## 2018-09-25 DIAGNOSIS — R61 Generalized hyperhidrosis: Secondary | ICD-10-CM

## 2018-09-25 DIAGNOSIS — Z95828 Presence of other vascular implants and grafts: Secondary | ICD-10-CM

## 2018-09-25 LAB — CBC WITH DIFFERENTIAL/PLATELET
Abs Immature Granulocytes: 1.02 10*3/uL — ABNORMAL HIGH (ref 0.00–0.07)
Basophils Absolute: 0.1 10*3/uL (ref 0.0–0.1)
Basophils Relative: 0 %
EOS ABS: 0 10*3/uL (ref 0.0–0.5)
Eosinophils Relative: 0 %
HCT: 31.5 % — ABNORMAL LOW (ref 39.0–52.0)
Hemoglobin: 10.9 g/dL — ABNORMAL LOW (ref 13.0–17.0)
Immature Granulocytes: 5 %
Lymphocytes Relative: 5 %
Lymphs Abs: 1.1 10*3/uL (ref 0.7–4.0)
MCH: 26.6 pg (ref 26.0–34.0)
MCHC: 34.6 g/dL (ref 30.0–36.0)
MCV: 76.8 fL — ABNORMAL LOW (ref 80.0–100.0)
MONO ABS: 1.6 10*3/uL — AB (ref 0.1–1.0)
Monocytes Relative: 7 %
Neutro Abs: 18.5 10*3/uL — ABNORMAL HIGH (ref 1.7–7.7)
Neutrophils Relative %: 83 %
Platelets: 245 10*3/uL (ref 150–400)
RBC: 4.1 MIL/uL — ABNORMAL LOW (ref 4.22–5.81)
RDW: 16.6 % — ABNORMAL HIGH (ref 11.5–15.5)
WBC: 22.2 10*3/uL — ABNORMAL HIGH (ref 4.0–10.5)
nRBC: 0.2 % (ref 0.0–0.2)

## 2018-09-25 LAB — CMP (CANCER CENTER ONLY)
ALT: 85 U/L — ABNORMAL HIGH (ref 0–44)
AST: 42 U/L — ABNORMAL HIGH (ref 15–41)
Albumin: 3.3 g/dL — ABNORMAL LOW (ref 3.5–5.0)
Alkaline Phosphatase: 202 U/L — ABNORMAL HIGH (ref 38–126)
Anion gap: 13 (ref 5–15)
BUN: 17 mg/dL (ref 6–20)
CALCIUM: 9.6 mg/dL (ref 8.9–10.3)
CO2: 25 mmol/L (ref 22–32)
CREATININE: 1.06 mg/dL (ref 0.61–1.24)
Chloride: 97 mmol/L — ABNORMAL LOW (ref 98–111)
GFR, Est AFR Am: >60 mL/min (ref >60)
GFR, Estimated: >60 mL/min (ref >60)
Glucose, Bld: 113 mg/dL — ABNORMAL HIGH (ref 70–99)
Potassium: 4.2 mmol/L (ref 3.5–5.1)
Sodium: 135 mmol/L (ref 135–145)
Total Bilirubin: 0.5 mg/dL (ref 0.3–1.2)
Total Protein: 8.1 g/dL (ref 6.5–8.1)

## 2018-09-25 MED ORDER — SODIUM CHLORIDE 0.9 % IV SOLN
200.0000 mg/m2 | Freq: Once | INTRAVENOUS | Status: AC
  Administered 2018-09-25: 420 mg via INTRAVENOUS
  Filled 2018-09-25: qty 21

## 2018-09-25 MED ORDER — PALONOSETRON HCL INJECTION 0.25 MG/5ML
INTRAVENOUS | Status: AC
  Filled 2018-09-25: qty 5

## 2018-09-25 MED ORDER — PROCHLORPERAZINE MALEATE 10 MG PO TABS: 10.0000 mg | ORAL_TABLET | Freq: Four times a day (QID) | ORAL | 2 refills | Status: AC | PRN

## 2018-09-25 MED ORDER — LIDOCAINE 5 % EX PTCH: 1.0000 | MEDICATED_PATCH | CUTANEOUS | 0 refills | Status: AC

## 2018-09-25 MED ORDER — SODIUM CHLORIDE 0.9 % IV SOLN
1250.0000 mg/m2 | Freq: Once | INTRAVENOUS | Status: AC
  Administered 2018-09-25: 2600 mg via INTRAVENOUS
  Filled 2018-09-25: qty 130

## 2018-09-25 MED ORDER — DOXORUBICIN HCL CHEMO IV INJECTION 2 MG/ML
35.0000 mg/m2 | Freq: Once | INTRAVENOUS | Status: AC
  Administered 2018-09-25: 72 mg via INTRAVENOUS
  Filled 2018-09-25: qty 36

## 2018-09-25 MED ORDER — ONDANSETRON HCL 8 MG PO TABS: 8.0000 mg | ORAL_TABLET | Freq: Two times a day (BID) | ORAL | 2 refills | Status: AC | PRN

## 2018-09-25 MED ORDER — SODIUM CHLORIDE 0.9% FLUSH
10.0000 mL | INTRAVENOUS | Status: DC | PRN
  Administered 2018-09-25: 10 mL
  Filled 2018-09-25: qty 10

## 2018-09-25 MED ORDER — HYDROCODONE-ACETAMINOPHEN 7.5-325 MG PO TABS: 1.0000 | ORAL_TABLET | Freq: Four times a day (QID) | ORAL | 0 refills | Status: AC | PRN

## 2018-09-25 MED ORDER — DICLOFENAC SODIUM 3 % TD GEL: 1.0000 "application " | Freq: Two times a day (BID) | TRANSDERMAL | 0 refills | Status: AC

## 2018-09-25 MED ORDER — SODIUM CHLORIDE 0.9 % IV SOLN
Freq: Once | INTRAVENOUS | Status: AC
  Administered 2018-09-25: 12:00:00 via INTRAVENOUS
  Filled 2018-09-25: qty 250

## 2018-09-25 MED ORDER — HEPARIN SOD (PORK) LOCK FLUSH 100 UNIT/ML IV SOLN
500.0000 [IU] | Freq: Once | INTRAVENOUS | Status: AC | PRN
  Administered 2018-09-25: 500 [IU]
  Filled 2018-09-25: qty 5

## 2018-09-25 MED ORDER — SODIUM CHLORIDE 0.9 % IV SOLN
Freq: Once | INTRAVENOUS | Status: AC
  Administered 2018-09-25: 12:00:00 via INTRAVENOUS
  Filled 2018-09-25: qty 5

## 2018-09-25 MED ORDER — PALONOSETRON HCL INJECTION 0.25 MG/5ML
0.2500 mg | Freq: Once | INTRAVENOUS | Status: AC
  Administered 2018-09-25: 0.25 mg via INTRAVENOUS

## 2018-09-25 NOTE — Progress Notes (Signed)
Continue Cycle 2 BEACOPP with same doses as first cycle per MD.  Hardie Pulley, PharmD, BCPS, BCOP

## 2018-09-25 NOTE — Telephone Encounter (Signed)
Scheduled appt per 3/4 los.  [facility] will contact patient.  Patient aware of appt date and time.

## 2018-09-25 NOTE — Progress Notes (Signed)
Ok to treat with elevated HR and elevated ALT per Dr. Irene Limbo.

## 2018-09-25 NOTE — Patient Instructions (Signed)
[facility] Discharge Instructions for Patients Receiving Chemotherapy  Today you received the following chemotherapy agents adriamycin/cytoxan/etoposide   To help prevent nausea and vomiting after your treatment, we encourage you to take your nausea medication as directed  If you develop nausea and vomiting that is not controlled by your nausea medication, call the clinic.   BELOW ARE SYMPTOMS THAT SHOULD BE REPORTED IMMEDIATELY:  *FEVER GREATER THAN 100.5 F  *CHILLS WITH OR WITHOUT FEVER  NAUSEA AND VOMITING THAT IS NOT CONTROLLED WITH YOUR NAUSEA MEDICATION  *UNUSUAL SHORTNESS OF BREATH  *UNUSUAL BRUISING OR BLEEDING  TENDERNESS IN MOUTH AND THROAT WITH OR WITHOUT PRESENCE OF ULCERS  *URINARY PROBLEMS  *BOWEL PROBLEMS  UNUSUAL RASH Items with * indicate a potential emergency and should be followed up as soon as possible.  Feel free to call the clinic you have any questions or concerns. The clinic phone number is [phone].

## 2018-09-25 NOTE — Telephone Encounter (Signed)
Oral Chemotherapy Pharmacist Encounter  Follow-Up Form  Spoke with patient and sister, Janett Billow, in infusion room today to follow up regarding patient's oral chemotherapy medication: Matulane (procarbazine) for the treatment of nodular sclerosis Hodgkin's lymphoma, Deauville 4 after 3 cycles of ABVD,in conjunction with bleomycin, etoposide, doxorubicin, cyclophosphamide, vincristine, and prednisone (escalated BEACOPP), planned durationup to 8 cycles.  Patient is starting cycle 2 BEACOPP today. Will re-initiate procarbazine tomorrow.  Original Start date of oral chemotherapy: 09/04/2018  Pt is doing well today  Pt reports 0 tablets/doses of Matulane (procarbazine) 50mg  capsules, 4 capsules (200mg ) by mouth once daily for 7 days out of every 21 day cycle missed in the last 4 weeks.   Pt reports the following side effects: None to report, tolerated 1st cycle of BEACOPP fairly well. They used prophylactic anti-emetics to prevent N/V that could have been possible with procarbazine administration. Refills for ondansetron and prochlorperazine were sent to local pharmacy today.  1st cycle was complicated by an admission for febrile neutropenia where a herniated disc was discovered. Patient's   Pertinent labs reviewed: OK for continued treatment.  Noted LFT elevations to 2x ULN. Total bilirubin remains WNL. Possible due to addition of Norco to medication regimen (acetaminophen component) to address pain secondary to herniated disc. No dose adjustment is provided by manufacturer, metabolism is primarily hepatic. Dose adjustments are noted in literature: Transaminases 1.6-6x ULN: administer 75% of dose  Discussed above with MD, patient to continue on procarbazine current dosing for now. Lab check planned on day 8 of cycle 2, if LFTs not resolved, procarbazine dosing may be cut short with this cycle.  Other Issues: They will order refill of procarbazine today. Phone number to dispensing pharmacy  provided to patient's sister. Patient will need Xarleto new prescription for the 20mg  tablet strength. I will alert MD to send this Rx when patient back on 10/02/18.  They know to call the office with questions or concerns. Oral Oncology Clinic will continue to follow.  Johny Drilling, PharmD, BCPS, BCOP  09/25/2018 12:08 PM Oral Oncology Clinic [phone]

## 2018-09-26 ENCOUNTER — Other Ambulatory Visit: Payer: Self-pay

## 2018-09-26 ENCOUNTER — Inpatient Hospital Stay: Payer: BLUE CROSS/BLUE SHIELD

## 2018-09-26 VITALS — BP 135/73 | HR 95 | Temp 98.7°F | Resp 18

## 2018-09-26 DIAGNOSIS — C8110 Nodular sclerosis classical Hodgkin lymphoma, unspecified site: Secondary | ICD-10-CM

## 2018-09-26 DIAGNOSIS — Z5111 Encounter for antineoplastic chemotherapy: Secondary | ICD-10-CM | POA: Diagnosis not present

## 2018-09-26 DIAGNOSIS — C8118 Nodular sclerosis classical Hodgkin lymphoma, lymph nodes of multiple sites: Secondary | ICD-10-CM

## 2018-09-26 MED ORDER — HEPARIN SOD (PORK) LOCK FLUSH 100 UNIT/ML IV SOLN
500.0000 [IU] | Freq: Once | INTRAVENOUS | Status: AC | PRN
  Administered 2018-09-26: 500 [IU]
  Filled 2018-09-26: qty 5

## 2018-09-26 MED ORDER — DEXAMETHASONE SODIUM PHOSPHATE 10 MG/ML IJ SOLN
10.0000 mg | Freq: Once | INTRAMUSCULAR | Status: AC
  Administered 2018-09-26: 10 mg via INTRAVENOUS

## 2018-09-26 MED ORDER — SODIUM CHLORIDE 0.9 % IV SOLN
Freq: Once | INTRAVENOUS | Status: AC
  Administered 2018-09-26: 09:00:00 via INTRAVENOUS
  Filled 2018-09-26: qty 250

## 2018-09-26 MED ORDER — SODIUM CHLORIDE 0.9% FLUSH
10.0000 mL | INTRAVENOUS | Status: DC | PRN
  Administered 2018-09-26: 10 mL
  Filled 2018-09-26: qty 10

## 2018-09-26 MED ORDER — DEXAMETHASONE SODIUM PHOSPHATE 10 MG/ML IJ SOLN
INTRAMUSCULAR | Status: AC
  Filled 2018-09-26: qty 1

## 2018-09-26 MED ORDER — SODIUM CHLORIDE 0.9 % IV SOLN
200.0000 mg/m2 | Freq: Once | INTRAVENOUS | Status: AC
  Administered 2018-09-26: 420 mg via INTRAVENOUS
  Filled 2018-09-26: qty 21

## 2018-09-26 NOTE — Patient Instructions (Signed)
[facility] Center Discharge Instructions for Patients Receiving Chemotherapy  Today you received the following chemotherapy agents: etoposide  To help prevent nausea and vomiting after your treatment, we encourage you to take your nausea medication as directed.   If you develop nausea and vomiting that is not controlled by your nausea medication, call the clinic.   BELOW ARE SYMPTOMS THAT SHOULD BE REPORTED IMMEDIATELY:  *FEVER GREATER THAN 100.5 F  *CHILLS WITH OR WITHOUT FEVER  NAUSEA AND VOMITING THAT IS NOT CONTROLLED WITH YOUR NAUSEA MEDICATION  *UNUSUAL SHORTNESS OF BREATH  *UNUSUAL BRUISING OR BLEEDING  TENDERNESS IN MOUTH AND THROAT WITH OR WITHOUT PRESENCE OF ULCERS  *URINARY PROBLEMS  *BOWEL PROBLEMS  UNUSUAL RASH Items with * indicate a potential emergency and should be followed up as soon as possible.  Feel free to call the clinic should you have any questions or concerns. The clinic phone number is [phone].  Please show the CHEMO ALERT CARD at check-in to the Emergency Department and triage nurse.   

## 2018-09-27 ENCOUNTER — Ambulatory Visit: Payer: BLUE CROSS/BLUE SHIELD

## 2018-09-27 ENCOUNTER — Other Ambulatory Visit: Payer: BLUE CROSS/BLUE SHIELD

## 2018-09-27 ENCOUNTER — Inpatient Hospital Stay: Payer: BLUE CROSS/BLUE SHIELD

## 2018-09-27 ENCOUNTER — Ambulatory Visit: Payer: BLUE CROSS/BLUE SHIELD | Admitting: Hematology

## 2018-09-27 VITALS — BP 122/72 | HR 102 | Temp 98.7°F | Resp 18

## 2018-09-27 DIAGNOSIS — C8110 Nodular sclerosis classical Hodgkin lymphoma, unspecified site: Secondary | ICD-10-CM

## 2018-09-27 DIAGNOSIS — C8118 Nodular sclerosis classical Hodgkin lymphoma, lymph nodes of multiple sites: Secondary | ICD-10-CM

## 2018-09-27 DIAGNOSIS — Z5111 Encounter for antineoplastic chemotherapy: Secondary | ICD-10-CM | POA: Diagnosis not present

## 2018-09-27 MED ORDER — SODIUM CHLORIDE 0.9 % IV SOLN
Freq: Once | INTRAVENOUS | Status: AC
  Administered 2018-09-27: 09:00:00 via INTRAVENOUS
  Filled 2018-09-27: qty 250

## 2018-09-27 MED ORDER — SODIUM CHLORIDE 0.9 % IV SOLN
200.0000 mg/m2 | Freq: Once | INTRAVENOUS | Status: AC
  Administered 2018-09-27: 420 mg via INTRAVENOUS
  Filled 2018-09-27: qty 21

## 2018-09-27 MED ORDER — SODIUM CHLORIDE 0.9% FLUSH
10.0000 mL | INTRAVENOUS | Status: DC | PRN
  Administered 2018-09-27: 10 mL
  Filled 2018-09-27: qty 10

## 2018-09-27 MED ORDER — HEPARIN SOD (PORK) LOCK FLUSH 100 UNIT/ML IV SOLN
500.0000 [IU] | Freq: Once | INTRAVENOUS | Status: AC | PRN
  Administered 2018-09-27: 500 [IU]
  Filled 2018-09-27: qty 5

## 2018-09-27 MED ORDER — SODIUM CHLORIDE 0.9 % IV SOLN
Freq: Once | INTRAVENOUS | Status: AC
  Administered 2018-09-27: 10:00:00 via INTRAVENOUS
  Filled 2018-09-27: qty 5

## 2018-09-27 NOTE — Patient Instructions (Signed)
[facility] Discharge Instructions for Patients Receiving Chemotherapy  Today you received the following chemotherapy agents: etoposide  To help prevent nausea and vomiting after your treatment, we encourage you to take your nausea medication as directed.   If you develop nausea and vomiting that is not controlled by your nausea medication, call the clinic.   BELOW ARE SYMPTOMS THAT SHOULD BE REPORTED IMMEDIATELY:  *FEVER GREATER THAN 100.5 F  *CHILLS WITH OR WITHOUT FEVER  NAUSEA AND VOMITING THAT IS NOT CONTROLLED WITH YOUR NAUSEA MEDICATION  *UNUSUAL SHORTNESS OF BREATH  *UNUSUAL BRUISING OR BLEEDING  TENDERNESS IN MOUTH AND THROAT WITH OR WITHOUT PRESENCE OF ULCERS  *URINARY PROBLEMS  *BOWEL PROBLEMS  UNUSUAL RASH Items with * indicate a potential emergency and should be followed up as soon as possible.  Feel free to call the clinic should you have any questions or concerns. The clinic phone number is [phone].  Please show the CHEMO ALERT CARD at check-in to the Emergency Department and triage nurse.   

## 2018-09-28 ENCOUNTER — Emergency Department (HOSPITAL_COMMUNITY): Payer: BLUE CROSS/BLUE SHIELD

## 2018-09-28 ENCOUNTER — Encounter (HOSPITAL_COMMUNITY): Payer: Self-pay

## 2018-09-28 ENCOUNTER — Other Ambulatory Visit: Payer: Self-pay

## 2018-09-28 ENCOUNTER — Emergency Department (HOSPITAL_COMMUNITY)
Admission: EM | Admit: 2018-09-28 | Discharge: 2018-09-28 | Disposition: A | Discharge status: Home / Self Care | Payer: BLUE CROSS/BLUE SHIELD | Attending: Emergency Medicine | Admitting: Emergency Medicine

## 2018-09-28 DIAGNOSIS — Z79899 Other long term (current) drug therapy: Secondary | ICD-10-CM | POA: Diagnosis not present

## 2018-09-28 DIAGNOSIS — R590 Localized enlarged lymph nodes: Secondary | ICD-10-CM | POA: Diagnosis not present

## 2018-09-28 LAB — COMPREHENSIVE METABOLIC PANEL
ALT: 53 U/L — ABNORMAL HIGH (ref 0–44)
AST: 18 U/L (ref 15–41)
Albumin: 3.1 g/dL — ABNORMAL LOW (ref 3.5–5.0)
Alkaline Phosphatase: 138 U/L — ABNORMAL HIGH (ref 38–126)
Anion gap: 9 (ref 5–15)
BUN: 13 mg/dL (ref 6–20)
CHLORIDE: 102 mmol/L (ref 98–111)
CO2: 25 mmol/L (ref 22–32)
CREATININE: 0.78 mg/dL (ref 0.61–1.24)
Calcium: 9.1 mg/dL (ref 8.9–10.3)
GFR calc non Af Amer: >60 mL/min (ref >60)
Glucose, Bld: 111 mg/dL — ABNORMAL HIGH (ref 70–99)
Potassium: 4.2 mmol/L (ref 3.5–5.1)
Sodium: 136 mmol/L (ref 135–145)
Total Bilirubin: 0.7 mg/dL (ref 0.3–1.2)
Total Protein: 6.8 g/dL (ref 6.5–8.1)

## 2018-09-28 LAB — GROUP A STREP BY PCR: GROUP A STREP BY PCR: NOT DETECTED

## 2018-09-28 LAB — CBC WITH DIFFERENTIAL/PLATELET
Abs Immature Granulocytes: 0.35 10*3/uL — ABNORMAL HIGH (ref 0.00–0.07)
Basophils Absolute: 0 10*3/uL (ref 0.0–0.1)
Basophils Relative: 0 %
Eosinophils Absolute: 0 10*3/uL (ref 0.0–0.5)
Eosinophils Relative: 0 %
HCT: 26.9 % — ABNORMAL LOW (ref 39.0–52.0)
Hemoglobin: 9.2 g/dL — ABNORMAL LOW (ref 13.0–17.0)
Immature Granulocytes: 2 %
Lymphocytes Relative: 0 %
Lymphs Abs: 0.1 10*3/uL — ABNORMAL LOW (ref 0.7–4.0)
MCH: 26.8 pg (ref 26.0–34.0)
MCHC: 34.2 g/dL (ref 30.0–36.0)
MCV: 78.4 fL — ABNORMAL LOW (ref 80.0–100.0)
Monocytes Absolute: 0 10*3/uL — ABNORMAL LOW (ref 0.1–1.0)
Monocytes Relative: 0 %
Neutro Abs: 23.3 10*3/uL — ABNORMAL HIGH (ref 1.7–7.7)
Neutrophils Relative %: 98 %
Platelets: 380 10*3/uL (ref 150–400)
RBC: 3.43 MIL/uL — ABNORMAL LOW (ref 4.22–5.81)
RDW: 16.8 % — ABNORMAL HIGH (ref 11.5–15.5)
WBC: 23.8 10*3/uL — ABNORMAL HIGH (ref 4.0–10.5)
nRBC: 0 % (ref 0.0–0.2)

## 2018-09-28 LAB — LACTIC ACID, PLASMA: Lactic Acid, Venous: 1.3 mmol/L (ref 0.5–1.9)

## 2018-09-28 MED ORDER — SODIUM CHLORIDE 0.9% FLUSH: 3.0000 mL | Freq: Once | INTRAVENOUS | Status: DC

## 2018-09-28 NOTE — ED Provider Notes (Signed)
[facility] EMERGENCY DEPARTMENT Provider Note   CSN: [id number] Arrival date & time: 09/28/18  1759    History   Chief Complaint Chief Complaint  Patient presents with  . Lymphadenopathy    HPI David Lam is a 23 y.o. male.     Patient presents with swollen lymph nodes in the neck.  Patient has history of Hodgkin's lymphoma and follows with Dr. Irene Limbo at the [facility] cancer center.  Patient denies any fever, weight change, night sweats disagreement, infectious or respiratory symptoms.  No sore throat.  Patient is currently on chemotherapy and steroid regimen.     Past Medical History:  Diagnosis Date  . Non Hodgkin's lymphoma ([place])   . Non Hodgkin's lymphoma [facility])     Patient Active Problem List   Diagnosis Date Noted  . Neutropenia with fever ([place]) 09/16/2018  . Back pain 09/16/2018  . Pulmonary embolism ([place]) 09/16/2018  . Hodgkin's lymphoma ([place]) 08/30/2018  . Port-A-Cath in place 07/05/2018  . Hodgkin lymphoma ([place]) 06/05/2018  . Chest pain 05/07/2018  . 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 Rupture of radial collateral ligament of left thumb 12/30/2014  . Left ankle pain 11/16/2014  . Closed fracture of shaft of left radius and ulna 10/31/2014  . Dislocation of carpometacarpal joint of left thumb 10/31/2014    Past Surgical History:  Procedure Laterality Date  . ANKLE SURGERY Left    cleaned up   . FRACTURE SURGERY Left    arm  . IR IMAGING GUIDED PORT INSERTION  06/03/2018  . MEDIASTINOTOMY CHAMBERLAIN MCNEIL N/A 05/10/2018   Procedure: Mediastinal mass biopsy ;  Surgeon: Grace Isaac, MD;  Location: [facility] OR;  Service: Thoracic;  Laterality: N/A;  . ORIF FINGER / THUMB FRACTURE Left         Home Medications    Prior to Admission medications   Medication Sig Start Date End Date Taking? Authorizing Provider  bisacodyl (DULCOLAX) 10 MG suppository Place 1 suppository (10 mg total) rectally daily as needed for moderate  constipation. 09/20/18   Aline August, MD  Diclofenac Sodium 3 % GEL Place 1 application onto the skin 2 (two) times daily. 09/25/18   [name], MD  diphenhydrAMINE (BENADRYL) 25 mg capsule Take 25 mg by mouth every 6 (six) hours as needed for allergies.     [provider]  HYDROcodone-acetaminophen (NORCO) 7.5-325 MG tablet Take 1 tablet by mouth every 6 (six) hours as needed for moderate pain. 09/25/18   [name], MD  lidocaine (LIDODERM) 5 % Place 1 patch onto the skin daily. Remove & Discard patch within 12 hours or as directed by MD 09/25/18   [name], MD  lidocaine-prilocaine (EMLA) cream Apply 1 application topically as needed. Patient taking differently: Apply 1 application topically as needed (port access).  06/04/18   [name], MD  LORazepam (ATIVAN) 0.5 MG tablet Take 1 tablet (0.5 mg total) by mouth every 6 (six) hours as needed (Nausea or vomiting). 08/30/18   [name], MD  methocarbamol (ROBAXIN) 500 MG tablet Take 1 tablet (500 mg total) by mouth every 6 (six) hours as needed for muscle spasms. 09/20/18   Aline August, MD  ondansetron (ZOFRAN) 8 MG tablet Take 1 tablet (8 mg total) by mouth 2 (two) times daily as needed. Start on the third day after doxorubicin/cytoxan chemotherapy. 09/25/18   [name], MD  polyethylene glycol [facility] / Floria Raveling) packet Take 17 g by mouth daily.  09/21/18   Aline August, MD  procarbazine (MATULANE) 50 MG capsule Take 4 capsules (200 mg total) by mouth daily. Take daily on days 1-7 of each 21 day cycle. Follow low tyramine diet. 09/04/18   [name], MD  prochlorperazine (COMPAZINE) 10 MG tablet Take 1 tablet (10 mg total) by mouth every 6 (six) hours as needed for nausea or vomiting. 09/25/18   [name], MD  Rivaroxaban 15 & 20 MG TBPK Take as directed on package: Start with one 15mg  tablet by mouth twice a day with food. On Day 22, switch to one 20mg  tablet  once a day with food. 09/04/18   Cardama, Grayce Sessions, MD  senna-docusate (SENOKOT-S) 8.6-50 MG tablet Take 1 tablet by mouth 2 (two) times daily. 09/20/18   Aline August, MD    Family History History reviewed. No pertinent family history.  Social History Social History   Tobacco Use  . Smoking status: Never Smoker  . Smokeless tobacco: Never Used  Substance Use Topics  . Alcohol use: Never    Frequency: Never  . Drug use: Never     Allergies   Patient has no known allergies.   Review of Systems Review of Systems  Constitutional: Negative for chills and fever.  HENT: Negative for congestion.   Eyes: Negative for visual disturbance.  Respiratory: Negative for shortness of breath.   Cardiovascular: Negative for chest pain.  Gastrointestinal: Negative for abdominal pain and vomiting.  Genitourinary: Negative for dysuria and flank pain.  Musculoskeletal: Negative for back pain, neck pain and neck stiffness.  Skin: Negative for rash.  Neurological: Negative for light-headedness and headaches.     Physical Exam Updated Vital Signs BP 123/72   Pulse (!) 101   Temp 99.4 F (37.4 C) (Oral)   Resp 20   SpO2 97%   Physical Exam Vitals signs and nursing note reviewed.  Constitutional:      Appearance: He is well-developed.  HENT:     Head: Normocephalic and atraumatic.  Eyes:     General:        Right eye: No discharge.        Left eye: No discharge.     Conjunctiva/sclera: Conjunctivae normal.  Neck:     Musculoskeletal: Normal range of motion and neck supple.     Trachea: No tracheal deviation.     Comments: Patient has mild anterior cervical adenopathy nontender.  No posterior cervical adenopathy.  No supraclavicular or axillary adenopathy bilateral.  Neck supple no meningismus.  No sign of infection posterior pharynx. Cardiovascular:     Rate and Rhythm: Normal rate and regular rhythm.  Pulmonary:     Effort: Pulmonary effort is normal.     Breath sounds:  Normal breath sounds.  Abdominal:     General: There is no distension.     Palpations: Abdomen is soft.     Tenderness: There is no abdominal tenderness. There is no guarding.  Lymphadenopathy:     Cervical: Cervical adenopathy present.  Skin:    General: Skin is warm.     Findings: No rash.  Neurological:     Mental Status: He is alert and oriented to person, place, and time.      ED Treatments / Results  Labs (all labs ordered are listed, but only abnormal results are displayed) Labs Reviewed  COMPREHENSIVE METABOLIC PANEL - Abnormal; Notable for the following components:      Result Value   Glucose, Bld 111 (*)  Albumin 3.1 (*)    ALT 53 (*)    Alkaline Phosphatase 138 (*)    All other components within normal limits  CBC WITH DIFFERENTIAL/PLATELET - Abnormal; Notable for the following components:   WBC 23.8 (*)    RBC 3.43 (*)    Hemoglobin 9.2 (*)    HCT 26.9 (*)    MCV 78.4 (*)    RDW 16.8 (*)    Neutro Abs 23.3 (*)    Lymphs Abs 0.1 (*)    Monocytes Absolute 0.0 (*)    Abs Immature Granulocytes 0.35 (*)    All other components within normal limits  GROUP A STREP BY PCR  LACTIC ACID, PLASMA  LACTIC ACID, PLASMA  URINALYSIS, ROUTINE W REFLEX MICROSCOPIC    EKG None  Radiology Dg Chest 2 View  Result Date: 09/28/2018 CLINICAL DATA:  Hodgkin's lymphoma.  Possible infection. EXAM: CHEST - 2 VIEW COMPARISON:  09/16/2018 FINDINGS: Right Port-A-Cath in place with the tip near the cavoatrial junction. Heart and mediastinal contours are within normal limits. No focal opacities or effusions. No acute bony abnormality. IMPRESSION: No active cardiopulmonary disease. Electronically Signed   By: Rolm Baptise M.D.   On: 09/28/2018 20:03    Procedures Procedures (including critical care time)  Medications Ordered in ED Medications  sodium chloride flush (NS) 0.9 % injection 3 mL (has no administration in time range)     Initial Impression / Assessment and Plan /  ED Course  I have reviewed the triage vital signs and the nursing notes.  Pertinent labs & imaging results that were available during my care of the patient were reviewed by me and considered in my medical decision making (see chart for details).       Patient presents with cervical adenopathy since this morning.  No signs of serious infection on exam.  With patient being on chemo and immunosuppressed patient had blood work performed which is similar to previous.  Patient has leukocytosis likely secondary to prednisone/medications no fever.  Patient feels well and has no symptoms currently.  Discussed the case and reviewed lab results with Dr. Lonn Georgia on the phone who agrees with close outpatient follow-up at scheduled appointment this week and to continue chemotherapy regimen. Chest x-ray was performed and no acute findings reviewed.  Strep testing was negative.  Results and differential diagnosis were discussed with the patient/parent/guardian. Xrays were independently reviewed by myself.  Close follow up outpatient was discussed, comfortable with the plan.   Medications  sodium chloride flush (NS) 0.9 % injection 3 mL (has no administration in time range)    Vitals:   09/28/18 1816 09/28/18 2145  BP: 105/65 123/72  Pulse: 77 (!) 101  Resp: 16 20  Temp: 99.4 F (37.4 C)   TempSrc: Oral   SpO2: 99% 97%    Final diagnoses:  Cervical lymphadenopathy     Final Clinical Impressions(s) / ED Diagnoses   Final diagnoses:  Cervical lymphadenopathy    ED Discharge Orders    None       Elnora Morrison, MD 09/28/18 2310

## 2018-09-28 NOTE — Discharge Instructions (Addendum)
Go to scheduled appointment with Dr Irene Limbo this week. Continue with normal treatment schedule.  Return for fevers or new symptoms.

## 2018-09-28 NOTE — ED Notes (Signed)
P{atient asked to get undresses, and change into hospital gown. He stated that he was only here to get his lymph nodes checked, and did not need to get undressed.

## 2018-09-28 NOTE — ED Triage Notes (Signed)
Pt has hx of hodgkins lymphoma- being treated at [facility]. Pt here for swollen lymph nodes in his neck that he noticed this morning. Pt afebrile in triage. No distress noted- denies any other illness.

## 2018-09-30 ENCOUNTER — Encounter: Payer: Self-pay | Admitting: *Deleted

## 2018-10-01 NOTE — Progress Notes (Signed)
HEMATOLOGY/ONCOLOGY CLINIC NOTE  Date of Service: 10/02/18     Patient Care Team: Patient, No Pcp Per as PCP - General (General Practice)  CHIEF COMPLAINTS/PURPOSE OF CONSULTATION:  Nodular sclerosis Hodgkin's Lymphoma   HISTORY OF PRESENTING ILLNESS:   David Lam is a wonderful 23 y.o. male who has been referred to [place] by Dr. 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 He is accompanied today by his mother and sister. The pt reports that he is doing well overall.   The pt presented to the ED on 05/07/18 after developing chest pain over 2 days, with positional elements. The pt was evaluated with a CXR which revealed a mediastinal mass that was biopsied on 05/10/18 and confirmed nodular sclerosis Hodgkin's lymphoma, as noted below.   The pt notes that he though his chest discomfort was due to working out, and denies noticing any swelling in the area. The pt denies any recent fevers, chills, night sweats, skin rashes, unexpected weight loss. The pt notes that his chest pain continues and is making his sleeping difficult. He denies any difficulty ambulating or SOB.   The pt reports that he has not had any prior medical problems, and has had left thumb and left ankle surgeries from injuries. 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   Of note prior to the patient's visit today, pt has had a [place] lymph node biopsy completed on 05/10/18 with results revealing Nodular sclerosis Hodgkin's Lymphoma    The pt also had a CTA Chest on 05/07/18 which revealed Bulky, bilateral anterior superior mediastinal confluent soft tissue masses which are not calcified in appearance and which do not appear to cause occlusion or significant luminal narrowing of traversing vasculature. Leading consideration is lymphoma, possibly Hodgkin's. Given lack of differentiating soft tissue densities, a teratoma is believed less likely. Nonseminomatous germ cell tumor  given presence of small effusions might also be within differential though no pulmonary lesions are visualized. Metastatic disease, infection or inflammatory process are believed less likely.  The pt also had a CT A/P on 05/08/18 which revealed No acute finding or evidence of malignancy in the abdomen.  Most recent lab results (05/09/18) of CBC is as follows: all values are WNL except for WBC at 10.6k, HGB at 11.9, HCT at 34.7, MCV at 78.5, ANC at 7.9k, Eosinophils abs at 600. 05/08/18 Sed Rate at 19  On review of systems, pt reports chest pain, moving his bowels well, and denies skin rashes, fevers, chills, night sweats, unexpected weight loss, SOB, headaches, changes in vision, urinary pain or discomfort, abdominal pains, testicular pain or swelling, leg swelling, arm swelling, and any other symptoms.   On PMHx the pt reports left thumb and left ankle surgeries, denies blood transfusions. On Social Hx the pt denies alcohol consumption and denies smoking cigarettes or other recreational drugs. 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 Denies tattoos.  On Family Hx the pt denies any blood disorders or cancers.  He denies any medications.  Interval History:   David Lam returns today for management, evaluation and C2D8 Escalated BEACOPP treatment of his Classical Hodgkin's Lymphoma. The patient's last visit with [place] was on 09/25/18. He is accompanied today by his sister. The pt reports that he is doing well overall.   The pt reports that he feels some soreness under his tongue, and notes that he hasn't been doing the salt and baking soda mouthwashes. He noticed a couple slightly enlarged  lymph nodes in his neck after his last infusion and presented to the ED for these, was evaluated with a Strep test which was negative. He denies these being overtly tender or painful. The pt notes that he feels tired, but not overtly fatigued. The pt denies fevers or chills.  Lab results today  (10/02/18) of CBC w/diff and CMP is as follows: all values are WNL except for WBC at 100, RBC at 3.36, HGB at 8.9, HCT at 26.2, MCV at 78.0, RDW at 15.9, ANC at 0, Lymphs abs at 100, Monocytes at 0, Albumin at 3.2, AST at 12, Alk Phos at 165.  On review of systems, pt reports feeling tired, soreness under the tongue, stable breathing, eating well, moving his bowels well, and denies dental pain, CP, fevers, abdominal pains, and any other symptoms.    MEDICAL HISTORY:  Past Medical History:  Diagnosis Date  . Non Hodgkin's lymphoma ([place])   . Non Hodgkin's lymphoma ([place])     SURGICAL HISTORY: Past Surgical History:  Procedure Laterality Date  . ANKLE SURGERY Left    cleaned up   . FRACTURE SURGERY Left    arm  . IR IMAGING GUIDED PORT INSERTION  06/03/2018  . MEDIASTINOTOMY CHAMBERLAIN MCNEIL N/A 05/10/2018   Procedure: Mediastinal mass biopsy ;  Surgeon: Grace Isaac, MD;  [facility] OR;  Service: Thoracic;  Laterality: N/A;  . ORIF FINGER / THUMB FRACTURE Left     SOCIAL HISTORY: Social History   Socioeconomic History  . Marital status: Single    Spouse name: Not on file  . Number of children: Not on file  . Years of education: Not on file  . Highest education level: Not on file  Occupational History  . Not on file  Social Needs  . Financial resource strain: Not on file  . Food insecurity:    Worry: Not on file    Inability: Not on file  . Transportation needs:    Medical: Not on file    Non-medical: Not on file  Tobacco Use  . Smoking status: Never Smoker  . Smokeless tobacco: Never Used  Substance and Sexual Activity  . Alcohol use: Never    Frequency: Never  . Drug use: Never  . Sexual activity: Not on file  Lifestyle  . Physical activity:    Days per week: Not on file    Minutes per session: Not on file  . Stress: Not on file  Relationships  . Social connections:    Talks on phone: Not on file    Gets together: Not on file    Attends religious  service: Not on file    Active member of club or organization: Not on file    Attends meetings of clubs or organizations: Not on file    Relationship status: Not on file  . Intimate partner violence:    Fear of current or ex partner: Not on file    Emotionally abused: Not on file    Physically abused: Not on file    Forced sexual activity: Not on file  Other Topics Concern  . Not on file  Social History Narrative  . Not on file    FAMILY HISTORY: No family history on file.  ALLERGIES:  has No Known Allergies.  MEDICATIONS:  Current Outpatient Medications  Medication Sig Dispense Refill  . bisacodyl (DULCOLAX) 10 MG suppository Place 1 suppository (10 mg total) rectally daily as needed for moderate constipation. 12 suppository 0  .  Diclofenac Sodium 3 % GEL Place 1 application onto the skin 2 (two) times daily. 100 g 0  . diphenhydrAMINE (BENADRYL) 25 mg capsule Take 25 mg by mouth every 6 (six) hours as needed for allergies.     Marland Kitchen HYDROcodone-acetaminophen (NORCO) 7.5-325 MG tablet Take 1 tablet by mouth every 6 (six) hours as needed for moderate pain. 30 tablet 0  . lidocaine (LIDODERM) 5 % Place 1 patch onto the skin daily. Remove & Discard patch within 12 hours or as directed by MD 15 patch 0  . lidocaine-prilocaine (EMLA) cream Apply 1 application topically as needed. (Patient taking differently: Apply 1 application topically as needed (port access). ) 30 g 0  . LORazepam (ATIVAN) 0.5 MG tablet Take 1 tablet (0.5 mg total) by mouth every 6 (six) hours as needed (Nausea or vomiting). 30 tablet 0  . methocarbamol (ROBAXIN) 500 MG tablet Take 1 tablet (500 mg total) by mouth every 6 (six) hours as needed for muscle spasms. 30 tablet 0  . ondansetron (ZOFRAN) 8 MG tablet Take 1 tablet (8 mg total) by mouth 2 (two) times daily as needed. Start on the third day after doxorubicin/cytoxan chemotherapy. 30 tablet 2  . polyethylene glycol (MIRALAX / GLYCOLAX) packet Take 17 g by mouth  daily. 14 each 0  . procarbazine (MATULANE) 50 MG capsule Take 4 capsules (200 mg total) by mouth daily. Take daily on days 1-7 of each 21 day cycle. Follow low tyramine diet. 28 capsule 2  . prochlorperazine (COMPAZINE) 10 MG tablet Take 1 tablet (10 mg total) by mouth every 6 (six) hours as needed for nausea or vomiting. 30 tablet 2  . rivaroxaban (XARELTO) 20 MG TABS tablet Take 1 tablet (20 mg total) by mouth daily with supper. 30 tablet 4  . senna-docusate (SENOKOT-S) 8.6-50 MG tablet Take 1 tablet by mouth 2 (two) times daily. 20 tablet 0   No current facility-administered medications for this visit.     REVIEW OF SYSTEMS:    A 10+ POINT REVIEW OF SYSTEMS WAS OBTAINED including neurology, dermatology, psychiatry, cardiac, respiratory, lymph, extremities, GI, GU, Musculoskeletal, constitutional, breasts, reproductive, HEENT.  All pertinent positives are noted in the HPI.  All others are negative.   PHYSICAL EXAMINATION: ECOG PERFORMANCE STATUS: 1 - Symptomatic but completely ambulatory  . Vitals:   10/02/18 1353  BP: 108/68  Pulse: (!) 115  Resp: 18  Temp: 98.6 F (37 C)  SpO2: 100%   Filed Weights   10/02/18 1353  Weight: 181 lb 12.8 oz (82.5 kg)   .Body mass index is 27.64 kg/m.  GENERAL:alert, in no acute distress and comfortable SKIN: no acute rashes, no significant lesions EYES: conjunctiva are pink and non-injected, sclera anicteric OROPHARYNX: MMM, no exudates, no oropharyngeal erythema or ulceration NECK: supple, no JVD LYMPH: Borderline enlarged left tonsil non-tender, no palpable lymphadenopathy in the axillary or inguinal regions LUNGS: clear to auscultation b/l with normal respiratory effort HEART: regular rate & rhythm ABDOMEN:  normoactive bowel sounds , non tender, not distended. No palpable hepatosplenomegaly.  Extremity: no pedal edema PSYCH: alert & oriented x 3 with fluent speech NEURO: no focal motor/sensory deficits   LABORATORY DATA:  I have  reviewed the data as listed  . CBC Latest Ref Rng & Units 10/02/2018 09/28/2018 09/25/2018  WBC 4.0 - 10.5 K/uL 0.1(LL) 23.8(H) 22.2(H)  Hemoglobin 13.0 - 17.0 g/dL 8.9(L) 9.2(L) 10.9(L)  Hematocrit 39.0 - 52.0 % 26.2(L) 26.9(L) 31.5(L)  Platelets 150 - 400 K/uL 228 380  245   ANC 0 . CMP Latest Ref Rng & Units 10/02/2018 09/28/2018 09/25/2018  Glucose 70 - 99 mg/dL 95 111(H) 113(H)  BUN 6 - 20 mg/dL '14 13 17  '$ Creatinine 0.61 - 1.24 mg/dL 0.78 0.78 1.06  Sodium 135 - 145 mmol/L 138 136 135  Potassium 3.5 - 5.1 mmol/L 4.5 4.2 4.2  Chloride 98 - 111 mmol/L 102 102 97(L)  CO2 22 - 32 mmol/L '26 25 25  '$ Calcium 8.9 - 10.3 mg/dL 9.2 9.1 9.6  Total Protein 6.5 - 8.1 g/dL 7.3 6.8 8.1  Total Bilirubin 0.3 - 1.2 mg/dL 0.4 0.7 0.5  Alkaline Phos 38 - 126 U/L 165(H) 138(H) 202(H)  AST 15 - 41 U/L 12(L) 18 42(H)  ALT 0 - 44 U/L 38 53(H) 85(H)   05/10/18 Biopsy:   05/22/18 BM Bx:    RADIOGRAPHIC STUDIES: I have personally reviewed the radiological images as listed and agreed with the findings in the report. Dg Chest 2 View  Result Date: 09/28/2018 CLINICAL DATA:  Hodgkin's lymphoma.  Possible infection. EXAM: CHEST - 2 VIEW COMPARISON:  09/16/2018 FINDINGS: Right Port-A-Cath in place with the tip near the cavoatrial junction. Heart and mediastinal contours are within normal limits. No focal opacities or effusions. No acute bony abnormality. IMPRESSION: No active cardiopulmonary disease. Electronically Signed   By: Rolm Baptise M.D.   On: 09/28/2018 20:03   Dg Chest 2 View  Result Date: 09/04/2018 CLINICAL DATA:  Chest pain, history of Hodgkin's lymphoma EXAM: CHEST - 2 VIEW COMPARISON:  08/16/2018 PET-CT FINDINGS: Cardiac shadow is within normal limits. Increased soft tissue density is along the right hilum and right paratracheal region as well as the superior mediastinum on the left consistent with the previously seen lymph nodes. Lungs are clear. No bony abnormality is noted. Right chest wall port is  seen. IMPRESSION: Changes consistent with mediastinal adenopathy similar to that seen on prior PET-CT. No acute abnormality is noted. Electronically Signed   By: Inez Catalina M.D.   On: 09/04/2018 02:32   Dg Abd 1 View  Result Date: 09/19/2018 CLINICAL DATA:  Abdominal distension. EXAM: ABDOMEN - 1 VIEW COMPARISON:  None. FINDINGS: The bowel gas pattern is normal. No radio-opaque calculi or other significant radiographic abnormality are seen. IMPRESSION: Negative.  Extensive bowel content is identified in the right colon. Electronically Signed   By: Abelardo Diesel M.D.   On: 09/19/2018 12:47   Ct Angio Chest Pe W And/or Wo Contrast  Result Date: 09/04/2018 CLINICAL DATA:  Chest pain. EXAM: CT ANGIOGRAPHY CHEST WITH CONTRAST TECHNIQUE: Multidetector CT imaging of the chest was performed using the standard protocol during bolus administration of intravenous contrast. Multiplanar CT image reconstructions and MIPs were obtained to evaluate the vascular anatomy. CONTRAST:  43m ISOVUE-370 IOPAMIDOL (ISOVUE-370) INJECTION 76% COMPARISON:  08/16/2018 PET-CT FINDINGS: Cardiovascular: 2 branching central pulmonary filling defects seen in subsegmental right upper lobe branches. No additional acute pulmonary embolism is seen. Normal heart size. No pericardial effusion. Porta catheter on the right with tip at the upper right atrium. 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 Lungs/Pleura: Generalized airway thickening and mosaic lung attenuation. This is likely accentuated by expiratory imaging. Similar findings were seen on 2020 PET-CT, although better inflated at that time. Even though there is acute pulmonary embolism this is most likely due to small airways disease given the extent greater than visible PE and the airway thickening. Upper Abdomen: Negative Musculoskeletal: Negative Critical Value/emergent results were called by telephone  at the time of interpretation on 09/04/2018 at  6:19 am to Dr. Addison Lank , who verbally acknowledged these results. Review of the MIP images confirms the above findings. IMPRESSION: 1. Positive for 2 acute subsegmental pulmonary emboli in the right upper lobe. 2. Heterogeneous aeration diffusely, likely small airways disease with air trapping given there is also generalized bronchial wall thickening. 3. 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 Electronically Signed   By: Monte Fantasia M.D.   On: 09/04/2018 06:20   Mr Thoracic Spine W Wo Contrast  Result Date: 09/16/2018 CLINICAL DATA:  23 y/o M; sudden onset severe lower back pain and bilateral leg weakness. History of non-Hodgkin's lymphoma. EXAM: MRI THORACIC AND LUMBAR SPINE WITHOUT AND WITH CONTRAST TECHNIQUE: Multiplanar and multiecho pulse sequences of the thoracic and lumbar spine were obtained without and with intravenous contrast. CONTRAST:  8 cc Gadavist COMPARISON:  09/04/2018 CT chest FINDINGS: MRI THORACIC SPINE FINDINGS Motion degradation of axial sequences. Alignment:  Physiologic. Vertebrae: No fracture, evidence of discitis, or bone lesion. No abnormal enhancement. Cord:  Normal signal and morphology.  No abnormal enhancement. Paraspinal and other soft tissues: Negative. Disc levels: No significant disc displacement, foraminal stenosis, or canal stenosis. MRI LUMBAR SPINE FINDINGS Segmentation:  Standard. Alignment:  Physiologic. Vertebrae: No fracture, evidence of discitis, or bone lesion. No abnormal enhancement. Conus medullaris: Extends to the L1 level and appears normal. No abnormal enhancement. Paraspinal and other soft tissues: Right kidney lower pole 16 mm cyst. Disc levels: L1-2: No significant disc displacement, foraminal stenosis, or canal stenosis. L2-3: No significant disc displacement, foraminal stenosis, or canal stenosis. L3-4: No significant disc displacement, foraminal stenosis, or canal stenosis. L4-5: No significant disc displacement, foraminal  stenosis, or canal stenosis. L5-S1: Central and left subarticular disc protrusion with annular fissure contacting the descending S1 nerve roots in the lateral recesses bilaterally. Mild bilateral foraminal stenosis and spinal canal stenosis. IMPRESSION: MRI thoracic spine: 1. Motion degradation of axial sequences. 2. No abnormal osseous or cord signal abnormality. 3. No significant disc displacement, foraminal stenosis, or canal stenosis. MRI lumbar spine: 1. No abnormal osseous or cord signal abnormality. 2. L5-S1 central and left subarticular disc protrusion with annular fissure contacting the descending S1 nerve roots in the lateral recesses as well as resulting in mild bilateral foraminal stenosis and spinal canal stenosis. Electronically Signed   By: Kristine Garbe M.D.   On: 09/16/2018 05:40   Mr Lumbar Spine W Wo Contrast  Result Date: 09/16/2018 CLINICAL DATA:  23 y/o M; sudden onset severe lower back pain and bilateral leg weakness. History of non-Hodgkin's lymphoma. EXAM: MRI THORACIC AND LUMBAR SPINE WITHOUT AND WITH CONTRAST TECHNIQUE: Multiplanar and multiecho pulse sequences of the thoracic and lumbar spine were obtained without and with intravenous contrast. CONTRAST:  8 cc Gadavist COMPARISON:  09/04/2018 CT chest FINDINGS: MRI THORACIC SPINE FINDINGS Motion degradation of axial sequences. Alignment:  Physiologic. Vertebrae: No fracture, evidence of discitis, or bone lesion. No abnormal enhancement. Cord:  Normal signal and morphology.  No abnormal enhancement. Paraspinal and other soft tissues: Negative. Disc levels: No significant disc displacement, foraminal stenosis, or canal stenosis. MRI LUMBAR SPINE FINDINGS Segmentation:  Standard. Alignment:  Physiologic. Vertebrae: No fracture, evidence of discitis, or bone lesion. No abnormal enhancement. Conus medullaris: Extends to the L1 level and appears normal. No abnormal enhancement. Paraspinal and other soft tissues: Right kidney  lower pole 16 mm cyst. Disc levels: L1-2: No significant disc displacement, foraminal stenosis, or canal stenosis. L2-3:  No significant disc displacement, foraminal stenosis, or canal stenosis. L3-4: No significant disc displacement, foraminal stenosis, or canal stenosis. L4-5: No significant disc displacement, foraminal stenosis, or canal stenosis. L5-S1: Central and left subarticular disc protrusion with annular fissure contacting the descending S1 nerve roots in the lateral recesses bilaterally. Mild bilateral foraminal stenosis and spinal canal stenosis. IMPRESSION: MRI thoracic spine: 1. Motion degradation of axial sequences. 2. No abnormal osseous or cord signal abnormality. 3. No significant disc displacement, foraminal stenosis, or canal stenosis. MRI lumbar spine: 1. No abnormal osseous or cord signal abnormality. 2. L5-S1 central and left subarticular disc protrusion with annular fissure contacting the descending S1 nerve roots in the lateral recesses as well as resulting in mild bilateral foraminal stenosis and spinal canal stenosis. Electronically Signed   By: Kristine Garbe M.D.   On: 09/16/2018 05:40   Dg Chest Portable 1 View  Result Date: 09/16/2018 CLINICAL DATA:  History of non-Hodgkin's lymphoma with back pain, initial encounter EXAM: PORTABLE CHEST 1 VIEW COMPARISON:  09/04/2018 FINDINGS: Cardiac shadow is stable. Right chest wall port is again identified and stable. Lungs are well aerated bilaterally. No focal infiltrate or sizable effusion is seen. Previously seen lymphadenopathy is stable given some slight variation in patient positioning. No bony abnormality is noted. IMPRESSION: No acute abnormality noted. Stable changes of lymphadenopathy consistent with the given clinical history. Electronically Signed   By: Inez Catalina M.D.   On: 09/16/2018 07:03   Vas [place] Lower Extremity Venous (dvt)  Result Date: 09/12/2018  Lower Venous Study Indications: Pulmonary embolism.   Performing Technologist: Abram Sander RVS  Examination Guidelines: A complete evaluation includes B-mode imaging, spectral Doppler, color Doppler, and power Doppler as needed of all accessible portions of each vessel. Bilateral testing is considered an integral part of a complete examination. Limited examinations for reoccurring indications may be performed as noted.  Right Venous Findings: +---------+---------------+---------+-----------+----------+-------+          CompressibilityPhasicitySpontaneityPropertiesSummary +---------+---------------+---------+-----------+----------+-------+ CFV      Full           Yes      Yes                          +---------+---------------+---------+-----------+----------+-------+ SFJ      Full                                                 +---------+---------------+---------+-----------+----------+-------+ FV Prox  Full                                                 +---------+---------------+---------+-----------+----------+-------+ FV Mid   Full                                                 +---------+---------------+---------+-----------+----------+-------+ FV DistalFull                                                 +---------+---------------+---------+-----------+----------+-------+  PFV      Full                                                 +---------+---------------+---------+-----------+----------+-------+ POP      Full           Yes      Yes                          +---------+---------------+---------+-----------+----------+-------+ PTV      Full                                                 +---------+---------------+---------+-----------+----------+-------+ PERO     Full                                                 +---------+---------------+---------+-----------+----------+-------+  Left Venous Findings: +---------+---------------+---------+-----------+----------+-------+           CompressibilityPhasicitySpontaneityPropertiesSummary +---------+---------------+---------+-----------+----------+-------+ CFV      Full           Yes      Yes                          +---------+---------------+---------+-----------+----------+-------+ SFJ      Full                                                 +---------+---------------+---------+-----------+----------+-------+ FV Prox  Full                                                 +---------+---------------+---------+-----------+----------+-------+ FV Mid   Full                                                 +---------+---------------+---------+-----------+----------+-------+ FV DistalFull                                                 +---------+---------------+---------+-----------+----------+-------+ PFV      Full                                                 +---------+---------------+---------+-----------+----------+-------+ POP      Full           Yes      Yes                          +---------+---------------+---------+-----------+----------+-------+  PTV      Full                                                 +---------+---------------+---------+-----------+----------+-------+ PERO     Full                                                 +---------+---------------+---------+-----------+----------+-------+    Summary: Right: There is no evidence of deep vein thrombosis in the lower extremity. No cystic structure found in the popliteal fossa. Left: There is no evidence of deep vein thrombosis in the lower extremity. No cystic structure found in the popliteal fossa.  *See table(s) above for measurements and observations. Electronically signed by Harold Barban MD on 09/12/2018 at 4:09:45 PM.    Final     ASSESSMENT & PLAN:   23 y.o. male with  1. 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 Leading consideration is lymphoma, possibly Hodgkin's. Given lack of differentiating soft tissue densities, a teratoma is believed less likely. Nonseminomatous germ cell tumor given presence of small effusions might also be within differential though no pulmonary lesions are visualized. Metastatic disease, infection or inflammatory process are believed less likely.    05/08/18 CT A/P revealed No acute finding or evidence of malignancy in the abdomen.  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 No hypermetabolic lymph nodes within the neck, abdomen or pelvis.  05/22/18 BM Bx was negative   S/p 3 cycles of ABVD  08/16/18 PET/CT revealed Since the prior PET of 05/21/2018, mild disease progression, as evidenced by increased activity and less so size of hypermetabolic thoracic nodes. (Deauville 4) 2. No new or extrathoracic sites of disease. 3. Significant hypermetabolic "brown" fat throughout the neck and chest, mildly degrading evaluation. 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 B/L great toe subungal hematomas - likely from chemotherapy -podiatry referral given -Patient saw Podiatry, and toe nail was removed -Continue Epsom salt soaks   3. Pulmonary Embolism 09/04/18 CTA Chest revealed Positive for 2 acute subsegmental pulmonary emboli in the right upper lobe. 2. Heterogeneous aeration diffusely, likely small airways disease with air trapping given there is also generalized  bronchial wall thickening. 3. 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 Tumor as risk factor for PE  4) Severe Neutropenia - no fever. PLAN: -Discussed pt labwork today, 10/02/18; WBC low at 100, with ANC of 0. Liver functions improved with AST down to 12, ALT normalized to 38 and Alk Phos decreased to 165 -Will cancel C2D8 Escalated BEACOPP treatment scheduled for today due to leukopenia -Ordering Neulasta today -Empiric Levaquin with asymmetric tonsillar swelling in setting of neutropenia -Strict neutropenic precautions. Present to the  ED if pt develops fever of 100.4. -Recommend salt and baking soda mouthwashes 4-5 times a day -Reviewed the NCCN guidelines with the pt again -Proceed with the 10/14/18 PET/CT -Will continue '20mg'$  Xarelto for at least 6 months total, and in the absence of malignancy or other provoking risk factors -Discussed that the pt should avoid elective procedures while on blood thinners -Regarding herniated disc: Pain management with local lidocaine patch, Diclofenac gel, and Norco, local heat application, outpatient physical therapy referral, and sleeping with C shaped pillow. Also recommend lower back belt if needed. -Recommend prophylactic Claritin up to one week prior to Neulasta, and additional week after Neulasta, with each cycle. -Continue Senna S and Miralax -Will refill Norco, and Robaxin only as needed -Pt will avoid NSAIDs -Will complete 2 cycles of escalated BEACOPP, then will repeat PET/CT to gauge response and determine additional cycle(s) vs beginning RT -Continue Vitamin B complex -Recommend salt and baking soda mouthwashes to prevent mouth sores   F/u for PET/CT, labs and MD visits as scheduled currently   All of the patients questions were answered with apparent satisfaction. The patient knows to call the clinic with any problems, questions or concerns.  The total time spent in the appt was 30 minutes and more than 50% was on  counseling and direct patient cares.    Sullivan Lone MD MS AAHIVMS [facility] Hematology/Oncology Physician [facility]  (Office):       [phone] (Work cell):  [phone] (Fax):           [phone]  10/02/2018 3:22 PM   I, Baldwin Jamaica, am acting as a scribe for Dr. Sullivan Lone.   .I have reviewed the above documentation for accuracy and completeness, and I agree with the above. [name] MD

## 2018-10-02 ENCOUNTER — Inpatient Hospital Stay: Payer: BLUE CROSS/BLUE SHIELD

## 2018-10-02 ENCOUNTER — Other Ambulatory Visit: Payer: Self-pay

## 2018-10-02 ENCOUNTER — Ambulatory Visit: Payer: BLUE CROSS/BLUE SHIELD | Admitting: Hematology

## 2018-10-02 ENCOUNTER — Other Ambulatory Visit: Payer: BLUE CROSS/BLUE SHIELD

## 2018-10-02 ENCOUNTER — Telehealth: Payer: Self-pay | Admitting: *Deleted

## 2018-10-02 ENCOUNTER — Ambulatory Visit: Payer: BLUE CROSS/BLUE SHIELD

## 2018-10-02 ENCOUNTER — Inpatient Hospital Stay (HOSPITAL_BASED_OUTPATIENT_CLINIC_OR_DEPARTMENT_OTHER): Payer: BLUE CROSS/BLUE SHIELD | Admitting: Hematology

## 2018-10-02 ENCOUNTER — Telehealth: Payer: Self-pay | Admitting: Hematology

## 2018-10-02 VITALS — BP 108/68 | HR 115 | Temp 98.6°F | Resp 18 | Ht 68.0 in | Wt 181.8 lb

## 2018-10-02 DIAGNOSIS — J069 Acute upper respiratory infection, unspecified: Secondary | ICD-10-CM

## 2018-10-02 DIAGNOSIS — I2699 Other pulmonary embolism without acute cor pulmonale: Secondary | ICD-10-CM

## 2018-10-02 DIAGNOSIS — Z95828 Presence of other vascular implants and grafts: Secondary | ICD-10-CM

## 2018-10-02 DIAGNOSIS — C8110 Nodular sclerosis classical Hodgkin lymphoma, unspecified site: Secondary | ICD-10-CM

## 2018-10-02 DIAGNOSIS — K1379 Other lesions of oral mucosa: Secondary | ICD-10-CM

## 2018-10-02 DIAGNOSIS — C8118 Nodular sclerosis classical Hodgkin lymphoma, lymph nodes of multiple sites: Secondary | ICD-10-CM | POA: Diagnosis not present

## 2018-10-02 DIAGNOSIS — M5126 Other intervertebral disc displacement, lumbar region: Secondary | ICD-10-CM

## 2018-10-02 DIAGNOSIS — D709 Neutropenia, unspecified: Secondary | ICD-10-CM | POA: Diagnosis not present

## 2018-10-02 DIAGNOSIS — Z5111 Encounter for antineoplastic chemotherapy: Secondary | ICD-10-CM | POA: Diagnosis not present

## 2018-10-02 DIAGNOSIS — D72819 Decreased white blood cell count, unspecified: Secondary | ICD-10-CM

## 2018-10-02 LAB — CBC WITH DIFFERENTIAL/PLATELET
Abs Immature Granulocytes: 0 10*3/uL (ref 0.00–0.07)
BASOS ABS: 0 10*3/uL (ref 0.0–0.1)
Basophils Relative: 15 %
Eosinophils Absolute: 0 10*3/uL (ref 0.0–0.5)
Eosinophils Relative: 3 %
HCT: 26.2 % — ABNORMAL LOW (ref 39.0–52.0)
Hemoglobin: 8.9 g/dL — ABNORMAL LOW (ref 13.0–17.0)
Lymphocytes Relative: 58 %
Lymphs Abs: 0.1 10*3/uL — ABNORMAL LOW (ref 0.7–4.0)
MCH: 26.5 pg (ref 26.0–34.0)
MCHC: 34 g/dL (ref 30.0–36.0)
MCV: 78 fL — ABNORMAL LOW (ref 80.0–100.0)
Monocytes Absolute: 0 10*3/uL — ABNORMAL LOW (ref 0.1–1.0)
Monocytes Relative: 2 %
Neutro Abs: 0 10*3/uL — CL (ref 1.7–17.7)
Neutrophils Relative %: 22 %
Platelets: 228 10*3/uL (ref 150–400)
RBC: 3.36 MIL/uL — ABNORMAL LOW (ref 4.22–5.81)
RDW: 15.9 % — ABNORMAL HIGH (ref 11.5–15.5)
WBC: 0.1 10*3/uL — CL (ref 4.0–10.5)
nRBC: 0 % (ref 0.0–0.2)

## 2018-10-02 LAB — CMP (CANCER CENTER ONLY)
ALT: 38 U/L (ref 0–44)
AST: 12 U/L — AB (ref 15–41)
Albumin: 3.2 g/dL — ABNORMAL LOW (ref 3.5–5.0)
Alkaline Phosphatase: 165 U/L — ABNORMAL HIGH (ref 38–126)
Anion gap: 10 (ref 5–15)
BUN: 14 mg/dL (ref 6–20)
CO2: 26 mmol/L (ref 22–32)
Calcium: 9.2 mg/dL (ref 8.9–10.3)
Chloride: 102 mmol/L (ref 98–111)
Creatinine: 0.78 mg/dL (ref 0.61–1.24)
GFR, Est AFR Am: >60 mL/min (ref >60)
GFR, Estimated: >60 mL/min (ref >60)
Glucose, Bld: 95 mg/dL (ref 70–99)
Potassium: 4.5 mmol/L (ref 3.5–5.1)
Sodium: 138 mmol/L (ref 135–145)
Total Bilirubin: 0.4 mg/dL (ref 0.3–1.2)
Total Protein: 7.3 g/dL (ref 6.5–8.1)

## 2018-10-02 LAB — RETICULOCYTES
Immature Retic Fract: 1.3 % — ABNORMAL LOW (ref 2.3–15.9)
RBC.: 3.36 MIL/uL — ABNORMAL LOW (ref 4.22–5.81)
Retic Count, Absolute: 5.7 10*3/uL — ABNORMAL LOW (ref 19.0–186.0)
Retic Ct Pct: 0.2 % — ABNORMAL LOW (ref 0.4–3.1)

## 2018-10-02 MED ORDER — AZITHROMYCIN 250 MG PO TABS: ORAL_TABLET | ORAL | 0 refills | Status: DC

## 2018-10-02 MED ORDER — PEGFILGRASTIM-CBQV 6 MG/0.6ML ~~LOC~~ SOSY
6.0000 mg | PREFILLED_SYRINGE | Freq: Once | SUBCUTANEOUS | Status: AC
  Administered 2018-10-02: 6 mg via SUBCUTANEOUS

## 2018-10-02 MED ORDER — RIVAROXABAN 20 MG PO TABS: 20.0000 mg | ORAL_TABLET | Freq: Every day | ORAL | 4 refills | Status: AC

## 2018-10-02 MED ORDER — SODIUM CHLORIDE 0.9% FLUSH
10.0000 mL | INTRAVENOUS | Status: DC | PRN
  Administered 2018-10-02: 10 mL
  Filled 2018-10-02: qty 10

## 2018-10-02 MED ORDER — PEGFILGRASTIM-CBQV 6 MG/0.6ML ~~LOC~~ SOSY
PREFILLED_SYRINGE | SUBCUTANEOUS | Status: AC
  Filled 2018-10-02: qty 0.6

## 2018-10-02 MED ORDER — LEVOFLOXACIN 500 MG PO TABS: 500.0000 mg | ORAL_TABLET | Freq: Every day | ORAL | 0 refills | Status: DC

## 2018-10-02 NOTE — Patient Instructions (Addendum)
Pegfilgrastim injection What is this medicine? PEGFILGRASTIM (PEG fil gra stim) is a long-acting granulocyte colony-stimulating factor that stimulates the growth of neutrophils, a type of white blood cell important in the body's fight against infection. It is used to reduce the incidence of fever and infection in patients with certain types of cancer who are receiving chemotherapy that affects the bone marrow, and to increase survival after being exposed to high doses of radiation. This medicine may be used for other purposes; ask your health care provider or pharmacist if you have questions. COMMON BRAND NAME(S): Domenic Moras, UDENYCA What should I tell my health care provider before I take this medicine? They need to know if you have any of these conditions: -kidney disease -latex allergy -ongoing radiation therapy -sickle cell disease -skin reactions to acrylic adhesives (On-Body Injector only) -an unusual or allergic reaction to pegfilgrastim, filgrastim, other medicines, foods, dyes, or preservatives -pregnant or trying to get pregnant -breast-feeding How should I use this medicine? This medicine is for injection under the skin. If you get this medicine at home, you will be taught how to prepare and give the pre-filled syringe or how to use the On-body Injector. Refer to the patient Instructions for Use for detailed instructions. Use exactly as directed. Tell your healthcare provider immediately if you suspect that the On-body Injector may not have performed as intended or if you suspect the use of the On-body Injector resulted in a missed or partial dose. It is important that you put your used needles and syringes in a special sharps container. Do not put them in a trash can. If you do not have a sharps container, call your pharmacist or healthcare provider to get one. Talk to your pediatrician regarding the use of this medicine in children. While this drug may be prescribed for  selected conditions, precautions do apply. Overdosage: If you think you have taken too much of this medicine contact a poison control center or emergency room at once. NOTE: This medicine is only for you. Do not share this medicine with others. What if I miss a dose? It is important not to miss your dose. Call your doctor or health care professional if you miss your dose. If you miss a dose due to an On-body Injector failure or leakage, a new dose should be administered as soon as possible using a single prefilled syringe for manual use. What may interact with this medicine? Interactions have not been studied. Give your health care provider a list of all the medicines, herbs, non-prescription drugs, or dietary supplements you use. Also tell them if you smoke, drink alcohol, or use illegal drugs. Some items may interact with your medicine. This list may not describe all possible interactions. Give your health care provider a list of all the medicines, herbs, non-prescription drugs, or dietary supplements you use. Also tell them if you smoke, drink alcohol, or use illegal drugs. Some items may interact with your medicine. What should I watch for while using this medicine? You may need blood work done while you are taking this medicine. If you are going to need a MRI, CT scan, or other procedure, tell your doctor that you are using this medicine (On-Body Injector only). What side effects may I notice from receiving this medicine? Side effects that you should report to your doctor or health care professional as soon as possible: -allergic reactions like skin rash, itching or hives, swelling of the face, lips, or tongue -back pain -dizziness -fever -pain, redness,  or irritation at site where injected -pinpoint red spots on the skin -red or dark-brown urine -shortness of breath or breathing problems -stomach or side pain, or pain at the shoulder -swelling -tiredness -trouble passing urine or  change in the amount of urine Side effects that usually do not require medical attention (report to your doctor or health care professional if they continue or are bothersome): -bone pain -muscle pain This list may not describe all possible side effects. Call your doctor for medical advice about side effects. You may report side effects to FDA at 1-800-FDA-1088. Where should I keep my medicine? Keep out of the reach of children. If you are using this medicine at home, you will be instructed on how to store it. Throw away any unused medicine after the expiration date on the label. NOTE: This sheet is a summary. It may not cover all possible information. If you have questions about this medicine, talk to your doctor, pharmacist, or health care provider.  2019 Elsevier/Gold Standard (2017-10-15 16:57:08)    Neutropenia Neutropenia is a condition that occurs when you have a lower-than-normal level of a type of white blood cell (neutrophil) in your body. Neutrophils are made in the spongy center of large bones (bone marrow) and they fight infections. Neutrophils are your body's main defense against bacterial and fungal infections. The fewer neutrophils you have and the longer your body remains without them, the greater your risk of getting a severe infection. What are the causes? This condition can occur if your body uses up or destroys neutrophils faster than your bone marrow can make them. This problem may happen because of:  Bacterial or fungal infection.  Allergic disorders.  Reactions to some medicines.  Autoimmune disease.  An enlarged spleen. This condition can also occur if your bone marrow does not produce enough neutrophils. This problem may be caused by:  Cancer.  Cancer treatments, such as radiation or chemotherapy.  Viral infections.  Medicines, such as phenytoin.  Vitamin B12 deficiency.  Diseases of the bone marrow.  Environmental toxins, such as  insecticides. What are the signs or symptoms? This condition does not usually cause symptoms. If symptoms are present, they are usually caused by an underlying infection. Symptoms of an infection may include:  Fever.  Chills.  Swollen glands.  Oral or anal ulcers.  Cough and shortness of breath.  Rash.  Skin infection.  Fatigue. How is this diagnosed? Your health care provider may suspect neutropenia if you have:  A condition that may cause neutropenia.  Symptoms of infection, especially fever.  Frequent and unusual infections. You will have a medical history and physical exam. Tests will also be done, such as:  A complete blood count (CBC).  A procedure to collect a sample of bone marrow for examination (bone marrow biopsy).  A chest X-ray.  A urine culture.  A blood culture. How is this treated? Treatment depends on the underlying cause and severity of your condition. Mild neutropenia may not require treatment. Treatment may include medicines, such as:  Antibiotic medicine given through an IV tube.  Antiviral medicines.  Antifungal medicines.  A medicine to increase neutrophil production (colony-stimulating factor). You may get this drug through an IV tube or by injection.  Steroids given through an IV tube. If an underlying condition is causing neutropenia, you may need treatment for that condition. If medicines you are taking are causing neutropenia, your health care provider may have you stop taking those medicines. Follow these instructions at home:  Medicines   Take over-the-counter and prescription medicines only as told by your health care provider.  Get a seasonal flu shot (influenza vaccine). Lifestyle  Do not eat unpasteurized foods.Do not eat unwashed raw fruits or vegetables.  Avoid exposure to groups of people or children.  Avoid being around people who are sick.  Avoid being around dirt or dust, such as in construction areas or  gardens.  Do not provide direct care for pets. Avoid animal droppings. Do not clean litter boxes and bird cages. Hygiene   Bathe daily.  Clean the area between the genitals and the anus (perineal area) after you urinate or have a bowel movement. If you are male, wipe from front to back.  Brush your teeth with a soft toothbrush before and after meals.  Do not use a razor that has a blade. Use an electric razor to remove hair.  Wash your hands often. Make sure others who come in contact with you also wash their hands. If soap and water are not available, use hand sanitizer. General instructions  Do not have sex unless your health care provider has approved.  Take actions to avoid cuts and burns. For example: ? Be cautious when you use knives. Always cut away from yourself. ? Keep knives in protective sheaths or guards when not in use. ? Use oven mitts when you cook with a hot stove, oven, or grill. ? Stand a safe distance away from open fires.  Avoid people who received a vaccine in the past 30 days if that vaccine contained a live version of the germ (live vaccine). You should not get a live vaccine. Common live vaccines are varicella, measles, mumps, and rubella.  Do not share food utensils.  Do not use tampons, enemas, or rectal suppositories unless your health care provider has approved.  Keep all appointments as told by your health care provider. This is important. Contact a health care provider if:  You have a fever.  You have chills or you start to shake.  You have: ? A sore throat. ? A warm, red, or tender area on your skin. ? A cough. ? Frequent or painful urination. ? Vaginal discharge or itching.  You develop: ? Sores in your mouth or anus. ? Swollen lymph nodes. ? Red streaks on the skin. ? A rash.  You feel: ? Nauseous or you vomit. ? Very fatigued. ? Short of breath. This information is not intended to replace advice given to you by your health  care provider. Make sure you discuss any questions you have with your health care provider. Document Released: 12/30/2001 Document Revised: 03/06/2018 Document Reviewed: 01/20/2015 Elsevier Interactive Patient Education  2019 [employer].

## 2018-10-02 NOTE — Telephone Encounter (Signed)
10-01-2018: David Lam [phone]) calling to schedule appointment tomorrow to see Dr. Irene Limbo about swelling under my neck and under tongue."  Tomorrow's schedule does include provider F/U visit.  Denies further needs or questions at this time.

## 2018-10-02 NOTE — Progress Notes (Signed)
Reviewed pt labs with Dr. Irene Limbo. Pt will not be receiving treatment today due to neutropenia. Pt to receive  Injection per orders and follow up after PET SCAN. Pt and sister educated on neutropenic precautions. Pt and sister educated on fever precautions and good hand hygiene. Pt and sister verbalized understanding of teaching and plan and had no further questions. Pt aware to call clinic with any questions or concerns.

## 2018-10-02 NOTE — Telephone Encounter (Signed)
Per 3/11 los appts already scheduled.

## 2018-10-03 ENCOUNTER — Telehealth: Payer: Self-pay | Admitting: *Deleted

## 2018-10-03 NOTE — Telephone Encounter (Signed)
[employer] [place] faxed Prior authorization determination for Lidocaine patches.  Facsimile to [employer] letter tray receptacle of Prior Authorization forms for review.

## 2018-10-04 ENCOUNTER — Inpatient Hospital Stay: Payer: BLUE CROSS/BLUE SHIELD

## 2018-10-11 ENCOUNTER — Ambulatory Visit: Payer: BLUE CROSS/BLUE SHIELD

## 2018-10-11 ENCOUNTER — Ambulatory Visit: Payer: BLUE CROSS/BLUE SHIELD | Admitting: Hematology

## 2018-10-11 ENCOUNTER — Other Ambulatory Visit: Payer: BLUE CROSS/BLUE SHIELD

## 2018-10-14 ENCOUNTER — Encounter (HOSPITAL_COMMUNITY): Payer: BLUE CROSS/BLUE SHIELD

## 2018-10-15 ENCOUNTER — Ambulatory Visit: Payer: BLUE CROSS/BLUE SHIELD | Admitting: Physical Therapy

## 2018-10-15 NOTE — Progress Notes (Signed)
HEMATOLOGY/ONCOLOGY CLINIC NOTE  Date of Service: 10/16/18     Patient Care Team: Patient, No Pcp Per as PCP - General (General Practice)  CHIEF COMPLAINTS/PURPOSE OF CONSULTATION:  Nodular sclerosis Hodgkin's Lymphoma   HISTORY OF PRESENTING ILLNESS:   David Lam is a wonderful 23 y.o. male who has been referred to [place] by Dr. 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 He is accompanied today by his mother and sister. The pt reports that he is doing well overall.   The pt presented to the ED on 05/07/18 after developing chest pain over 2 days, with positional elements. The pt was evaluated with a CXR which revealed a mediastinal mass that was biopsied on 05/10/18 and confirmed nodular sclerosis Hodgkin's lymphoma, as noted below.   The pt notes that he though his chest discomfort was due to working out, and denies noticing any swelling in the area. The pt denies any recent fevers, chills, night sweats, skin rashes, unexpected weight loss. The pt notes that his chest pain continues and is making his sleeping difficult. He denies any difficulty ambulating or SOB.   The pt reports that he has not had any prior medical problems, and has had left thumb and left ankle surgeries from injuries. 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   Of note prior to the patient's visit today, pt has had a Scalene lymph node biopsy completed on 05/10/18 with results revealing Nodular sclerosis Hodgkin's Lymphoma    The pt also had a CTA Chest on 05/07/18 which revealed Bulky, bilateral anterior superior mediastinal confluent soft tissue masses which are not calcified in appearance and which do not appear to cause occlusion or significant luminal narrowing of traversing vasculature. Leading consideration is lymphoma, possibly Hodgkin's. Given lack of differentiating soft tissue densities, a teratoma is believed less likely. Nonseminomatous germ cell tumor  given presence of small effusions might also be within differential though no pulmonary lesions are visualized. Metastatic disease, infection or inflammatory process are believed less likely.  The pt also had a CT A/P on 05/08/18 which revealed No acute finding or evidence of malignancy in the abdomen.  Most recent lab results (05/09/18) of CBC is as follows: all values are WNL except for WBC at 10.6k, HGB at 11.9, HCT at 34.7, MCV at 78.5, ANC at 7.9k, Eosinophils abs at 600. 05/08/18 Sed Rate at 19  On review of systems, pt reports chest pain, moving his bowels well, and denies skin rashes, fevers, chills, night sweats, unexpected weight loss, SOB, headaches, changes in vision, urinary pain or discomfort, abdominal pains, testicular pain or swelling, leg swelling, arm swelling, and any other symptoms.   On PMHx the pt reports left thumb and left ankle surgeries, denies blood transfusions. On Social Hx the pt denies alcohol consumption and denies smoking cigarettes or other recreational drugs. 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 Denies tattoos.  On Family Hx the pt denies any blood disorders or cancers.  He denies any medications.  Interval History:   David Lam returns today for management, evaluation and C3D1 Escalated BEACOPP treatment of his Classical Hodgkin's Lymphoma. The patient's last visit with [place] was on 10/02/18. He is accompanied today by his sister and mother via cell phone. The pt reports that he is doing well overall.   The pt reports that his previous neck lump resolved after antibiotics. He denies any remaining pain or discomfort in the jaw. The pt denies noticing  any other new lumps or bumps in the interim. The pt denies fevers or chills, and denies abdominal pains or leg swelling. He also denies any concerns for infections at this time.  The pt notes that he is no longer having chest pains.  Of note since the patient's last visit, pt has had a PET/CT  completed on 10/16/18 with results revealing Further response to therapy of hypermetabolic thoracic adenopathy. (Deauville 4) 2. Diffuse marrow hypermetabolism likely relates to marrow stimulation by chemotherapy. 3. Mild hypermetabolism and subcutaneous nodularity superficial to the left mandible. Indeterminate. Consider physical exam correlation.  Lab results today (10/16/18) of CBC w/diff and CMP is as follows: all values are WNL except for WBC at 14.4k, RBC at 3.78, HGB at 10.3, HCT at 31.0, RDW at 20.4, nRBC at 1.0%, ANC at 12.3k, Abs immature granulocytes at 0.34k, Glucose at 142, Albumin at 3.4, Alk Phos at 182.  On review of systems, pt reports stable energy levels, and denies jaw pain or discomfort, chest pain, fevers, chills, concerns for infections, noticing any new lumps or bumps, fevers, chills, abdominal pains, leg swelling, and any other symptoms.  MEDICAL HISTORY:  Past Medical History:  Diagnosis Date   Non Hodgkin's lymphoma ([place])    Non Hodgkin's lymphoma ([place])     SURGICAL HISTORY: Past Surgical History:  Procedure Laterality Date   ANKLE SURGERY Left    cleaned up    FRACTURE SURGERY Left    arm   IR IMAGING GUIDED PORT INSERTION  06/03/2018   MEDIASTINOTOMY CHAMBERLAIN MCNEIL N/A 05/10/2018   Procedure: Mediastinal mass biopsy ;  Surgeon: David Isaac, MD;  Location: [facility] OR;  Service: Thoracic;  Laterality: N/A;   ORIF FINGER / THUMB FRACTURE Left     SOCIAL HISTORY: Social History   Socioeconomic History   Marital status: Single    Spouse name: Not on file   Number of children: Not on file   Years of education: Not on file   Highest education level: Not on file  Occupational History   Not on file  Social Needs   Financial resource strain: Not on file   Food insecurity:    Worry: Not on file    Inability: Not on file   Transportation needs:    Medical: Not on file    Non-medical: Not on file  Tobacco Use   Smoking status: Never  Smoker   Smokeless tobacco: Never Used  Substance and Sexual Activity   Alcohol use: Never    Frequency: Never   Drug use: Never   Sexual activity: Not on file  Lifestyle   Physical activity:    Days per week: Not on file    Minutes per session: Not on file   Stress: Not on file  Relationships   Social connections:    Talks on phone: Not on file    Gets together: Not on file    Attends religious service: Not on file    Active member of club or organization: Not on file    Attends meetings of clubs or organizations: Not on file    Relationship status: Not on file   Intimate partner violence:    Fear of current or ex partner: Not on file    Emotionally abused: Not on file    Physically abused: Not on file    Forced sexual activity: Not on file  Other Topics Concern   Not on file  Social History Narrative   Not on file  FAMILY HISTORY: No family history on file.  ALLERGIES:  has No Known Allergies.  MEDICATIONS:  Current Outpatient Medications  Medication Sig Dispense Refill   bisacodyl (DULCOLAX) 10 MG suppository Place 1 suppository (10 mg total) rectally daily as needed for moderate constipation. 12 suppository 0   Diclofenac Sodium 3 % GEL Place 1 application onto the skin 2 (two) times daily. 100 g 0   diphenhydrAMINE (BENADRYL) 25 mg capsule Take 25 mg by mouth every 6 (six) hours as needed for allergies.      HYDROcodone-acetaminophen (NORCO) 7.5-325 MG tablet Take 1 tablet by mouth every 6 (six) hours as needed for moderate pain. 30 tablet 0   levofloxacin (LEVAQUIN) 500 MG tablet Take 1 tablet (500 mg total) by mouth daily. Canceled Zpak prescription 10 tablet 0   lidocaine (LIDODERM) 5 % Place 1 patch onto the skin daily. Remove & Discard patch within 12 hours or as directed by MD 15 patch 0   lidocaine-prilocaine (EMLA) cream Apply 1 application topically as needed. (Patient taking differently: Apply 1 application topically as needed (port  access). ) 30 g 0   LORazepam (ATIVAN) 0.5 MG tablet Take 1 tablet (0.5 mg total) by mouth every 6 (six) hours as needed (Nausea or vomiting). 30 tablet 0   methocarbamol (ROBAXIN) 500 MG tablet Take 1 tablet (500 mg total) by mouth every 6 (six) hours as needed for muscle spasms. 30 tablet 0   ondansetron (ZOFRAN) 8 MG tablet Take 1 tablet (8 mg total) by mouth 2 (two) times daily as needed. Start on the third day after doxorubicin/cytoxan chemotherapy. 30 tablet 2   polyethylene glycol (MIRALAX / GLYCOLAX) packet Take 17 g by mouth daily. 14 each 0   procarbazine (MATULANE) 50 MG capsule Take 4 capsules (200 mg total) by mouth daily. Take daily on days 1-7 of each 21 day cycle. Follow low tyramine diet. 28 capsule 2   prochlorperazine (COMPAZINE) 10 MG tablet Take 1 tablet (10 mg total) by mouth every 6 (six) hours as needed for nausea or vomiting. 30 tablet 2   rivaroxaban (XARELTO) 20 MG TABS tablet Take 1 tablet (20 mg total) by mouth daily with supper. 30 tablet 4   senna-docusate (SENOKOT-S) 8.6-50 MG tablet Take 1 tablet by mouth 2 (two) times daily. 20 tablet 0   No current facility-administered medications for this visit.    Facility-Administered Medications Ordered in Other Visits  Medication Dose Route Frequency Provider Last Rate Last Dose   cyclophosphamide (CYTOXAN) 2,600 mg in sodium chloride 0.9 % 500 mL chemo infusion  1,250 mg/m2 (Treatment Plan Recorded) Intravenous Once [name], MD       DOXOrubicin (ADRIAMYCIN) chemo injection 72 mg  35 mg/m2 (Treatment Plan Recorded) Intravenous Once [name], MD       etoposide (VEPESID) 420 mg in sodium chloride 0.9 % 1,250 mL chemo infusion  200 mg/m2 (Treatment Plan Recorded) Intravenous Once [name], MD       fludeoxyglucose F - 18 (FDG) injection 9 millicurie  9 millicurie Intravenous Once PRN Abigail Miyamoto, MD       fosaprepitant (EMEND) 150 mg, dexamethasone (DECADRON) 12 mg in sodium  chloride 0.9 % 145 mL IVPB   Intravenous Once [name], MD       heparin lock flush 100 unit/mL  500 Units Intracatheter Once PRN [name], MD       palonosetron (ALOXI) injection 0.25 mg  0.25 mg Intravenous Once Dickson, Davide Risdon  Vivien Rossetti, MD       sodium chloride flush (NS) 0.9 % injection 10 mL  10 mL Intracatheter PRN [name], MD        REVIEW OF SYSTEMS:    A 10+ POINT REVIEW OF SYSTEMS WAS OBTAINED including neurology, dermatology, psychiatry, cardiac, respiratory, lymph, extremities, GI, GU, Musculoskeletal, constitutional, breasts, reproductive, HEENT.  All pertinent positives are noted in the HPI.  All others are negative.   PHYSICAL EXAMINATION: ECOG PERFORMANCE STATUS: 1 - Symptomatic but completely ambulatory  . Vitals:   10/16/18 1046  BP: 127/77  Pulse: (!) 113  Resp: 18  Temp: 98.6 F (37 C)  SpO2: 100%   Filed Weights   10/16/18 1046  Weight: 186 lb 8 oz (84.6 kg)   .Body mass index is 28.36 kg/m.  GENERAL:alert, in no acute distress and comfortable SKIN: no acute rashes, no significant lesions EYES: conjunctiva are pink and non-injected, sclera anicteric OROPHARYNX: MMM, no exudates, no oropharyngeal erythema or ulceration NECK: supple, no JVD LYMPH: Borderline enlarged left tonsil non-tender, no palpable lymphadenopathy in the axillary or inguinal regions LUNGS: clear to auscultation b/l with normal respiratory effort HEART: regular rate & rhythm ABDOMEN:  normoactive bowel sounds , non tender, not distended. No palpable hepatosplenomegaly.  Extremity: no pedal edema PSYCH: alert & oriented x 3 with fluent speech NEURO: no focal motor/sensory deficits   LABORATORY DATA:  I have reviewed the data as listed  . CBC Latest Ref Rng & Units 10/16/2018 10/02/2018 09/28/2018  WBC 4.0 - 10.5 K/uL 14.4(H) 0.1(LL) 23.8(H)  Hemoglobin 13.0 - 17.0 g/dL 10.3(L) 8.9(L) 9.2(L)  Hematocrit 39.0 - 52.0 % 31.0(L) 26.2(L) 26.9(L)    Platelets 150 - 400 K/uL 182 228 380   ANC 0 . CMP Latest Ref Rng & Units 10/16/2018 10/02/2018 09/28/2018  Glucose 70 - 99 mg/dL 142(H) 95 111(H)  BUN 6 - 20 mg/dL _0 Creatinine 0.61 - 1.24 mg/dL 0.86 0.78 0.78  Sodium 135 - 145 mmol/L 139 138 136  Potassium 3.5 - 5.1 mmol/L 4.0 4.5 4.2  Chloride 98 - 111 mmol/L 104 102 102  CO2 22 - 32 mmol/L _1 Calcium 8.9 - 10.3 mg/dL 9.3 9.2 9.1  Total Protein 6.5 - 8.1 g/dL 7.7 7.3 6.8  Total Bilirubin 0.3 - 1.2 mg/dL 0.3 0.4 0.7  Alkaline Phos 38 - 126 U/L 182(H) 165(H) 138(H)  AST 15 - 41 U/L 17 12(L) 18  ALT 0 - 44 U/L 24 38 53(H)   05/10/18 Biopsy:   05/22/18 BM Bx:    RADIOGRAPHIC STUDIES: I have personally reviewed the radiological images as listed and agreed with the findings in the report. Dg Chest 2 View  Result Date: 09/28/2018 CLINICAL DATA:  Hodgkin's lymphoma.  Possible infection. EXAM: CHEST - 2 VIEW COMPARISON:  09/16/2018 FINDINGS: Right Port-A-Cath in place with the tip near the cavoatrial junction. Heart and mediastinal contours are within normal limits. No focal opacities or effusions. No acute bony abnormality. IMPRESSION: No active cardiopulmonary disease. Electronically Signed   By: Rolm Baptise M.D.   On: 09/28/2018 20:03   Dg Abd 1 View  Result Date: 09/19/2018 CLINICAL DATA:  Abdominal distension. EXAM: ABDOMEN - 1 VIEW COMPARISON:  None. FINDINGS: The bowel gas pattern is normal. No radio-opaque calculi or other significant radiographic abnormality are seen. IMPRESSION: Negative.  Extensive bowel content is identified in the right colon. Electronically Signed   By: Abelardo Diesel M.D.   On: 09/19/2018 12:47  Nm Pet Image Restag (ps) Skull Base To Thigh  Result Date: 10/16/2018 CLINICAL DATA:  Subsequent treatment strategy for Hodgkin's lymphoma. EXAM: NUCLEAR MEDICINE PET SKULL BASE TO THIGH TECHNIQUE: 9.0 mCi F-18 FDG was injected intravenously. Full-ring PET imaging was performed from the skull base  to thigh after the radiotracer. CT data was obtained and used for attenuation correction and anatomic localization. Fasting blood glucose: 102 mg/dl COMPARISON:  08/16/2018 PET.  Chest CT 09/04/2018. FINDINGS: Mediastinal blood pool activity: SUV max 1.7 Liver activity: a S.U.V. max of 3.1 NECK: No cervical nodal hypermetabolism. Minimal subcutaneous nodularity superficial to the left side of the mandible measures a S.U.V. max of 5.5 on image 31/4 and is new. Incidental CT findings: No cervical adenopathy. CHEST: Right paratracheal node measures 1.4 cm and a S.U.V. max of 7.8. Compare 1.6 cm and a S.U.V. max of 10.3 on the prior. Prevascular nodal mass measures 3.4 x 2.7 cm and a S.U.V. max of 4.9 today. Compare 4.7 x 3.7 cm and a S.U.V. max of 6.3 on the prior. Right cardiophrenic angle nodal mass measures 4.8 x 3.3 cm and a S.U.V. max of 11.7. Compare 7.3 x 4.3 cm and a S.U.V. max of 14.1 on the prior. Incidental CT findings: Mild cardiomegaly. Trace left pleural thickening. Right Port-A-Cath tip at mid right atrium. ABDOMEN/PELVIS: No abdominopelvic parenchymal or nodal hypermetabolism. Incidental CT findings: Normal adrenal glands. No abdominopelvic adenopathy. No splenomegaly. SKELETON: Diffuse mild marrow hypermetabolism. Incidental CT findings: none IMPRESSION: 1. Further response to therapy of hypermetabolic thoracic adenopathy. (Deauville 4) 2. Diffuse marrow hypermetabolism likely relates to marrow stimulation by chemotherapy. 3. Mild hypermetabolism and subcutaneous nodularity superficial to the left mandible. Indeterminate. Consider physical exam correlation. Electronically Signed   By: Abigail Miyamoto M.D.   On: 10/16/2018 09:19    ASSESSMENT & PLAN:   23 y.o. male with  1. 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 Leading consideration is lymphoma, possibly Hodgkin's. Given lack of differentiating soft tissue densities, a teratoma is believed less likely. Nonseminomatous germ cell tumor given presence of small effusions might also be within differential though no pulmonary lesions are visualized. Metastatic disease, infection or inflammatory process are believed less likely.    05/08/18 CT A/P revealed No acute finding or evidence of malignancy in the abdomen.  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 No hypermetabolic lymph nodes within the neck, abdomen or pelvis.  05/22/18 BM Bx was negative   S/p 3 cycles of ABVD  08/16/18 PET/CT revealed Since the prior PET of 05/21/2018, mild disease progression, as evidenced by increased activity and less so size of hypermetabolic thoracic nodes. (Deauville 4) 2. No new or extrathoracic sites of disease. 3. Significant hypermetabolic "brown" fat throughout the neck and chest, mildly degrading evaluation. 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 B/L great toe subungal hematomas - likely from chemotherapy -podiatry referral given -Patient saw Podiatry, and toe nail was removed -Continue Epsom salt soaks  3. Pulmonary Embolism 09/04/18 CTA Chest revealed Positive for 2 acute subsegmental pulmonary emboli in the right upper lobe. 2. Heterogeneous aeration diffusely, likely small airways disease with air trapping given there is also generalized bronchial wall thickening. 3. 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 Tumor as  risk factor for PE  4) Severe Neutropenia - no fever.  PLAN: -Discussed pt labwork today, 10/16/18; ANC bounced back after Neulasta to 12.3k, Liver enzymes have normalized. -Discussed the 10/16/18 PET/CT which revealed Further response to therapy of hypermetabolic thoracic adenopathy. (Deauville 4) 2. Diffuse marrow hypermetabolism likely relates to marrow stimulation by chemotherapy. 3. Mild hypermetabolism and subcutaneous nodularity superficial to the left mandible. Indeterminate. Consider physical exam correlation. -In light of the above PET/CT results, I discussed that I recommend completing C3 to get as close to remission as possible, and then considering RT with Rad Onc. -Will refer the pt to Rad Onc for consideration of RT after seeing pt back in one week with C3D8 -Skipped C2D8 due to significant neutropenia. -The pt has no prohibitive toxicities from continuing C3D1 Escalated BEACOPP at this time. -Advised strict infection prevention strategies and precautions -Recommend salt and baking soda mouthwashes 4-5 times a day -Will continue 51m Xarelto for at least 6 months total, and in the absence of malignancy or other provoking risk factors -Discussed that the pt should avoid elective procedures while on blood thinners -Regarding herniated disc: Pain management with local lidocaine patch, Diclofenac gel, and Norco, local heat application, outpatient physical therapy referral, and sleeping with C shaped pillow. Also recommend lower back belt if needed. -Recommend prophylactic Claritin up to one week prior to Neulasta, and additional week after Neulasta, with each cycle. -Continue Senna S and Miralax -Will refill Norco, and Robaxin only as needed -Pt will avoid NSAIDs -Continue Vitamin B complex -Will see the pt back in 1 week with C3D8 Escalated BEACOPP   RTC with Dr KIrene Limbowith labs on C3D8 of treatment on 10/23/2018   All of the patients questions were answered with apparent  satisfaction. The patient knows to call the clinic with any problems, questions or concerns.  The total time spent in the appt was 30 minutes and more than 50% was on counseling and direct patient cares.    GSullivan LoneMD MS AAHIVMS SMercy Surgery Center LLCCCarroll County Ambulatory Surgical CenterHematology/Oncology Physician CEncompass Health [facility] [place] (Office):       [phone](Work cell):  [phone](Fax):           [phone] 10/16/2018 12:09 PM   I, SBaldwin Jamaica am acting as a scribe for Dr. GSullivan Lone   .I have reviewed the above documentation for accuracy and completeness, and I agree with the above. .Brunetta GeneraMD

## 2018-10-16 ENCOUNTER — Inpatient Hospital Stay (HOSPITAL_BASED_OUTPATIENT_CLINIC_OR_DEPARTMENT_OTHER): Payer: BLUE CROSS/BLUE SHIELD | Admitting: Hematology

## 2018-10-16 ENCOUNTER — Inpatient Hospital Stay: Payer: BLUE CROSS/BLUE SHIELD

## 2018-10-16 ENCOUNTER — Other Ambulatory Visit: Payer: Self-pay

## 2018-10-16 ENCOUNTER — Telehealth: Payer: Self-pay | Admitting: Hematology

## 2018-10-16 ENCOUNTER — Ambulatory Visit (HOSPITAL_COMMUNITY)
Admission: RE | Admit: 2018-10-16 | Discharge: 2018-10-16 | Disposition: A | Discharge status: Home / Self Care | Payer: BLUE CROSS/BLUE SHIELD | Source: Ambulatory Visit | Attending: Hematology | Admitting: Hematology

## 2018-10-16 VITALS — BP 127/77 | HR 113 | Temp 98.6°F | Resp 18 | Ht 68.0 in | Wt 186.5 lb

## 2018-10-16 DIAGNOSIS — C8110 Nodular sclerosis classical Hodgkin lymphoma, unspecified site: Secondary | ICD-10-CM

## 2018-10-16 DIAGNOSIS — C8118 Nodular sclerosis classical Hodgkin lymphoma, lymph nodes of multiple sites: Secondary | ICD-10-CM

## 2018-10-16 DIAGNOSIS — C8190 Hodgkin lymphoma, unspecified, unspecified site: Secondary | ICD-10-CM

## 2018-10-16 DIAGNOSIS — D709 Neutropenia, unspecified: Secondary | ICD-10-CM | POA: Diagnosis not present

## 2018-10-16 DIAGNOSIS — Z5111 Encounter for antineoplastic chemotherapy: Secondary | ICD-10-CM

## 2018-10-16 DIAGNOSIS — Z95828 Presence of other vascular implants and grafts: Secondary | ICD-10-CM

## 2018-10-16 DIAGNOSIS — I2699 Other pulmonary embolism without acute cor pulmonale: Secondary | ICD-10-CM | POA: Diagnosis not present

## 2018-10-16 LAB — CMP (CANCER CENTER ONLY)
ALT: 24 U/L (ref 0–44)
AST: 17 U/L (ref 15–41)
Albumin: 3.4 g/dL — ABNORMAL LOW (ref 3.5–5.0)
Alkaline Phosphatase: 182 U/L — ABNORMAL HIGH (ref 38–126)
Anion gap: 11 (ref 5–15)
BUN: 13 mg/dL (ref 6–20)
CO2: 24 mmol/L (ref 22–32)
Calcium: 9.3 mg/dL (ref 8.9–10.3)
Chloride: 104 mmol/L (ref 98–111)
Creatinine: 0.86 mg/dL (ref 0.61–1.24)
GFR, Est AFR Am: >60 mL/min (ref >60)
GFR, Estimated: >60 mL/min (ref >60)
Glucose, Bld: 142 mg/dL — ABNORMAL HIGH (ref 70–99)
Potassium: 4 mmol/L (ref 3.5–5.1)
Sodium: 139 mmol/L (ref 135–145)
Total Bilirubin: 0.3 mg/dL (ref 0.3–1.2)
Total Protein: 7.7 g/dL (ref 6.5–8.1)

## 2018-10-16 LAB — CBC WITH DIFFERENTIAL/PLATELET
ABS IMMATURE GRANULOCYTES: 0.34 10*3/uL — AB (ref 0.00–0.07)
Basophils Absolute: 0.1 10*3/uL (ref 0.0–0.1)
Basophils Relative: 1 %
EOS ABS: 0 10*3/uL (ref 0.0–0.5)
Eosinophils Relative: 0 %
HCT: 31 % — ABNORMAL LOW (ref 39.0–52.0)
Hemoglobin: 10.3 g/dL — ABNORMAL LOW (ref 13.0–17.0)
IMMATURE GRANULOCYTES: 2 %
Lymphocytes Relative: 6 %
Lymphs Abs: 0.9 10*3/uL (ref 0.7–4.0)
MCH: 27.2 pg (ref 26.0–34.0)
MCHC: 33.2 g/dL (ref 30.0–36.0)
MCV: 82 fL (ref 80.0–100.0)
Monocytes Absolute: 0.9 10*3/uL (ref 0.1–1.0)
Monocytes Relative: 6 %
Neutro Abs: 12.3 10*3/uL — ABNORMAL HIGH (ref 1.7–7.7)
Neutrophils Relative %: 85 %
PLATELETS: 182 10*3/uL (ref 150–400)
RBC: 3.78 MIL/uL — ABNORMAL LOW (ref 4.22–5.81)
RDW: 20.4 % — AB (ref 11.5–15.5)
WBC: 14.4 10*3/uL — ABNORMAL HIGH (ref 4.0–10.5)
nRBC: 1 % — ABNORMAL HIGH (ref 0.0–0.2)

## 2018-10-16 LAB — GLUCOSE, CAPILLARY: Glucose-Capillary: 102 mg/dL — ABNORMAL HIGH (ref 70–99)

## 2018-10-16 MED ORDER — SODIUM CHLORIDE 0.9% FLUSH
10.0000 mL | INTRAVENOUS | Status: DC | PRN
  Administered 2018-10-16: 10 mL
  Filled 2018-10-16: qty 10

## 2018-10-16 MED ORDER — SODIUM CHLORIDE 0.9 % IV SOLN
200.0000 mg/m2 | Freq: Once | INTRAVENOUS | Status: AC
  Administered 2018-10-16: 420 mg via INTRAVENOUS
  Filled 2018-10-16: qty 21

## 2018-10-16 MED ORDER — SODIUM CHLORIDE 0.9 % IV SOLN
Freq: Once | INTRAVENOUS | Status: AC
  Administered 2018-10-16: 12:00:00 via INTRAVENOUS
  Filled 2018-10-16: qty 250

## 2018-10-16 MED ORDER — FLUDEOXYGLUCOSE F - 18 (FDG) INJECTION: 9.0000 | Freq: Once | INTRAVENOUS | Status: DC | PRN

## 2018-10-16 MED ORDER — PALONOSETRON HCL INJECTION 0.25 MG/5ML
0.2500 mg | Freq: Once | INTRAVENOUS | Status: AC
  Administered 2018-10-16: 0.25 mg via INTRAVENOUS

## 2018-10-16 MED ORDER — HEPARIN SOD (PORK) LOCK FLUSH 100 UNIT/ML IV SOLN
500.0000 [IU] | Freq: Once | INTRAVENOUS | Status: AC | PRN
  Administered 2018-10-16: 500 [IU]
  Filled 2018-10-16: qty 5

## 2018-10-16 MED ORDER — DOXORUBICIN HCL CHEMO IV INJECTION 2 MG/ML
35.0000 mg/m2 | Freq: Once | INTRAVENOUS | Status: AC
  Administered 2018-10-16: 72 mg via INTRAVENOUS
  Filled 2018-10-16: qty 36

## 2018-10-16 MED ORDER — PALONOSETRON HCL INJECTION 0.25 MG/5ML
INTRAVENOUS | Status: AC
  Filled 2018-10-16: qty 5

## 2018-10-16 MED ORDER — SODIUM CHLORIDE 0.9 % IV SOLN
Freq: Once | INTRAVENOUS | Status: AC
  Administered 2018-10-16: 12:00:00 via INTRAVENOUS
  Filled 2018-10-16: qty 5

## 2018-10-16 MED ORDER — SODIUM CHLORIDE 0.9 % IV SOLN
1250.0000 mg/m2 | Freq: Once | INTRAVENOUS | Status: AC
  Administered 2018-10-16: 2600 mg via INTRAVENOUS
  Filled 2018-10-16: qty 130

## 2018-10-16 NOTE — Progress Notes (Signed)
No UA today and going forward per Dr. Lita Mains, Pharm.D., CPP 10/16/2018@12 :16 PM

## 2018-10-16 NOTE — Progress Notes (Signed)
Informed pt that there is a shortage of PAC access kits during this time, and we are encouraging pt's who return for consecutive infusions to keep their PAC's accessed and go home with a bio-disc and occlusive dressing. Pt verbalized understanding but declined to keep PAC accessed. Informed him that there might not be a PAC kit for him Thurs. or Fri and that he would therefore have to get his infusion through a PIV. Pt verbalized understanding but still stated he wanted to be de-accessed so he could shower tonight.

## 2018-10-16 NOTE — Progress Notes (Signed)
Continue full doses per Dr. Irene Limbo. Kennith [place], Pharm.D., CPP 10/16/2018@12 :07 PM

## 2018-10-16 NOTE — Progress Notes (Signed)
Per Dr. Irene Limbo: OK to treat with elevated heart rate

## 2018-10-16 NOTE — Patient Instructions (Signed)
Coronavirus (COVID-19) Are you at risk?  Are you at risk for the Coronavirus (COVID-19)?  To be considered HIGH RISK for Coronavirus (COVID-19), you have to meet the following criteria:  . Traveled to [place], [place], [place], [place] or [place]; or in the [place] to [place], [place], [place], or [place]; and have fever, cough, and shortness of breath within the last 2 weeks of travel OR . Been in close contact with a person diagnosed with COVID-19 within the last 2 weeks and have fever, cough, and shortness of breath . IF YOU DO NOT MEET THESE CRITERIA, YOU ARE CONSIDERED LOW RISK FOR COVID-19.  What to do if you are HIGH RISK for COVID-19?  . If you are having a medical emergency, call 911. . Seek medical care right away. Before you go to a doctor's office, urgent care or emergency department, call ahead and tell them about your recent travel, contact with someone diagnosed with COVID-19, and your symptoms. You should receive instructions from your physician's office regarding next steps of care.  . When you arrive at healthcare provider, tell the healthcare staff immediately you have returned from visiting [place], [place], [place], [place] or [place]; or traveled in the [place] to [place], [place], [place], or [place]; in the last two weeks or you have been in close contact with a person diagnosed with COVID-19 in the last 2 weeks.   . Tell the health care staff about your symptoms: fever, cough and shortness of breath. . After you have been seen by a medical provider, you will be either: o Tested for (COVID-19) and discharged home on quarantine except to seek medical care if symptoms worsen, and asked to  - Stay home and avoid contact with others until you get your results (4-5 days)  - Avoid travel on public transportation if possible (such as bus, train, or airplane) or o Sent to the Emergency Department by EMS for evaluation, COVID-19 testing, and possible  admission depending on your condition and test results.  What to do if you are LOW RISK for COVID-19?  Reduce your risk of any infection by using the same precautions used for avoiding the common cold or flu:  . Wash your hands often with soap and warm water for at least 20 seconds.  If soap and water are not readily available, use an alcohol-based hand sanitizer with at least 60% alcohol.  . If coughing or sneezing, cover your mouth and nose by coughing or sneezing into the elbow areas of your shirt or coat, into a tissue or into your sleeve (not your hands). . Avoid shaking hands with others and consider head nods or verbal greetings only. . Avoid touching your eyes, nose, or mouth with unwashed hands.  . Avoid close contact with people who are sick. . Avoid places or events with large numbers of people in one location, like concerts or sporting events. . Carefully consider travel plans you have or are making. . If you are planning any travel outside or inside the [place], visit the CDC's Travelers' Health webpage for the latest health notices. . If you have some symptoms but not all symptoms, continue to monitor at home and seek medical attention if your symptoms worsen. . If you are having a medical emergency, call 911.   ADDITIONAL HEALTHCARE OPTIONS FOR PATIENTS  [facility] / e-Visit: [email or web address]         [facility]: [phone]  Onset   Urgent Care: [facility]: [facility] Discharge Instructions for Patients Receiving Chemotherapy  Today you received the following chemotherapy agents: Doxorubicin (Adriamycin), Cyclophosphamide (Cytoxan), and Etoposide (Vepesid, VP-16)  To help prevent nausea and vomiting after your treatment, we encourage you to take your nausea medication as directed.    If you develop nausea and vomiting that  is not controlled by your nausea medication, call the clinic.   BELOW ARE SYMPTOMS THAT SHOULD BE REPORTED IMMEDIATELY:  *FEVER GREATER THAN 100.5 F  *CHILLS WITH OR WITHOUT FEVER  NAUSEA AND VOMITING THAT IS NOT CONTROLLED WITH YOUR NAUSEA MEDICATION  *UNUSUAL SHORTNESS OF BREATH  *UNUSUAL BRUISING OR BLEEDING  TENDERNESS IN MOUTH AND THROAT WITH OR WITHOUT PRESENCE OF ULCERS  *URINARY PROBLEMS  *BOWEL PROBLEMS  UNUSUAL RASH Items with * indicate a potential emergency and should be followed up as soon as possible.  Feel free to call the clinic should you have any questions or concerns. The clinic phone number is [phone].  Please show the [place] [facility] at check-in to the Emergency Department and triage nurse.

## 2018-10-16 NOTE — Telephone Encounter (Signed)
Scheduled appt per 3/25 los.  Patient aware of appt date and time.

## 2018-10-17 ENCOUNTER — Other Ambulatory Visit: Payer: Self-pay

## 2018-10-17 ENCOUNTER — Inpatient Hospital Stay: Payer: BLUE CROSS/BLUE SHIELD

## 2018-10-17 VITALS — BP 137/79 | HR 100 | Temp 98.4°F | Resp 18

## 2018-10-17 DIAGNOSIS — C8110 Nodular sclerosis classical Hodgkin lymphoma, unspecified site: Secondary | ICD-10-CM

## 2018-10-17 DIAGNOSIS — Z5111 Encounter for antineoplastic chemotherapy: Secondary | ICD-10-CM | POA: Diagnosis not present

## 2018-10-17 DIAGNOSIS — C8118 Nodular sclerosis classical Hodgkin lymphoma, lymph nodes of multiple sites: Secondary | ICD-10-CM

## 2018-10-17 MED ORDER — DEXAMETHASONE SODIUM PHOSPHATE 10 MG/ML IJ SOLN
INTRAMUSCULAR | Status: AC
  Filled 2018-10-17: qty 1

## 2018-10-17 MED ORDER — SODIUM CHLORIDE 0.9 % IV SOLN
Freq: Once | INTRAVENOUS | Status: AC
  Administered 2018-10-17: 09:00:00 via INTRAVENOUS
  Filled 2018-10-17: qty 250

## 2018-10-17 MED ORDER — SODIUM CHLORIDE 0.9% FLUSH
10.0000 mL | INTRAVENOUS | Status: DC | PRN
  Administered 2018-10-17: 10 mL
  Filled 2018-10-17: qty 10

## 2018-10-17 MED ORDER — DEXAMETHASONE SODIUM PHOSPHATE 10 MG/ML IJ SOLN
10.0000 mg | Freq: Once | INTRAMUSCULAR | Status: AC
  Administered 2018-10-17: 10 mg via INTRAVENOUS

## 2018-10-17 MED ORDER — HEPARIN SOD (PORK) LOCK FLUSH 100 UNIT/ML IV SOLN
500.0000 [IU] | Freq: Once | INTRAVENOUS | Status: AC | PRN
  Administered 2018-10-17: 500 [IU]
  Filled 2018-10-17: qty 5

## 2018-10-17 MED ORDER — SODIUM CHLORIDE 0.9 % IV SOLN
200.0000 mg/m2 | Freq: Once | INTRAVENOUS | Status: AC
  Administered 2018-10-17: 420 mg via INTRAVENOUS
  Filled 2018-10-17: qty 21

## 2018-10-17 NOTE — Patient Instructions (Signed)
Coronavirus (COVID-19) Are you at risk?  Are you at risk for the Coronavirus (COVID-19)?  To be considered HIGH RISK for Coronavirus (COVID-19), you have to meet the following criteria:  . Traveled to [place], [place], [place], [place] or [place]; or in the [place] to [place], [place], [place], or [place]; and have fever, cough, and shortness of breath within the last 2 weeks of travel OR . Been in close contact with a person diagnosed with COVID-19 within the last 2 weeks and have fever, cough, and shortness of breath . IF YOU DO NOT MEET THESE CRITERIA, YOU ARE CONSIDERED LOW RISK FOR COVID-19.  What to do if you are HIGH RISK for COVID-19?  . If you are having a medical emergency, call 911. . Seek medical care right away. Before you go to a doctor's office, urgent care or emergency department, call ahead and tell them about your recent travel, contact with someone diagnosed with COVID-19, and your symptoms. You should receive instructions from your physician's office regarding next steps of care.  . When you arrive at healthcare provider, tell the healthcare staff immediately you have returned from visiting [place], [place], [place], [place] or [place]; or traveled in the [place] to [place], [place], [place], or [place]; in the last two weeks or you have been in close contact with a person diagnosed with COVID-19 in the last 2 weeks.   . Tell the health care staff about your symptoms: fever, cough and shortness of breath. . After you have been seen by a medical provider, you will be either: o Tested for (COVID-19) and discharged home on quarantine except to seek medical care if symptoms worsen, and asked to  - Stay home and avoid contact with others until you get your results (4-5 days)  - Avoid travel on public transportation if possible (such as bus, train, or airplane) or o Sent to the Emergency Department by EMS for evaluation, COVID-19 testing, and possible  admission depending on your condition and test results.  What to do if you are LOW RISK for COVID-19?  Reduce your risk of any infection by using the same precautions used for avoiding the common cold or flu:  . Wash your hands often with soap and warm water for at least 20 seconds.  If soap and water are not readily available, use an alcohol-based hand sanitizer with at least 60% alcohol.  . If coughing or sneezing, cover your mouth and nose by coughing or sneezing into the elbow areas of your shirt or coat, into a tissue or into your sleeve (not your hands). . Avoid shaking hands with others and consider head nods or verbal greetings only. . Avoid touching your eyes, nose, or mouth with unwashed hands.  . Avoid close contact with people who are sick. . Avoid places or events with large numbers of people in one location, like concerts or sporting events. . Carefully consider travel plans you have or are making. . If you are planning any travel outside or inside the [place], visit the CDC's Travelers' Health webpage for the latest health notices. . If you have some symptoms but not all symptoms, continue to monitor at home and seek medical attention if your symptoms worsen. . If you are having a medical emergency, call 911.   ADDITIONAL HEALTHCARE OPTIONS FOR PATIENTS  [facility] / e-Visit: [email or web address]         [facility]: [phone]  Onset   Urgent Care: [facility]: [facility] Discharge Instructions for Patients Receiving Chemotherapy  Today you received the following chemotherapy agents: Doxorubicin (Adriamycin), Cyclophosphamide (Cytoxan), and Etoposide (Vepesid, VP-16)  To help prevent nausea and vomiting after your treatment, we encourage you to take your nausea medication as directed.    If you develop nausea and vomiting that  is not controlled by your nausea medication, call the clinic.   BELOW ARE SYMPTOMS THAT SHOULD BE REPORTED IMMEDIATELY:  *FEVER GREATER THAN 100.5 F  *CHILLS WITH OR WITHOUT FEVER  NAUSEA AND VOMITING THAT IS NOT CONTROLLED WITH YOUR NAUSEA MEDICATION  *UNUSUAL SHORTNESS OF BREATH  *UNUSUAL BRUISING OR BLEEDING  TENDERNESS IN MOUTH AND THROAT WITH OR WITHOUT PRESENCE OF ULCERS  *URINARY PROBLEMS  *BOWEL PROBLEMS  UNUSUAL RASH Items with * indicate a potential emergency and should be followed up as soon as possible.  Feel free to call the clinic should you have any questions or concerns. The clinic phone number is [phone].  Please show the [facility] at check-in to the Emergency Department and triage nurse.

## 2018-10-17 NOTE — Progress Notes (Signed)
Pt aware of shortage of port access kits. Pt given the option to be accessed and to stay accessed for tomorrow or to get an IV. Pt chose to have port accessed and to stay accessed for tomorrow.

## 2018-10-18 ENCOUNTER — Inpatient Hospital Stay: Payer: BLUE CROSS/BLUE SHIELD

## 2018-10-18 ENCOUNTER — Other Ambulatory Visit: Payer: Self-pay

## 2018-10-18 VITALS — BP 132/81 | HR 94 | Temp 98.4°F | Resp 16

## 2018-10-18 DIAGNOSIS — Z5111 Encounter for antineoplastic chemotherapy: Secondary | ICD-10-CM | POA: Diagnosis not present

## 2018-10-18 DIAGNOSIS — C8110 Nodular sclerosis classical Hodgkin lymphoma, unspecified site: Secondary | ICD-10-CM

## 2018-10-18 DIAGNOSIS — C8118 Nodular sclerosis classical Hodgkin lymphoma, lymph nodes of multiple sites: Secondary | ICD-10-CM

## 2018-10-18 MED ORDER — SODIUM CHLORIDE 0.9 % IV SOLN
200.0000 mg/m2 | Freq: Once | INTRAVENOUS | Status: AC
  Administered 2018-10-18: 420 mg via INTRAVENOUS
  Filled 2018-10-18: qty 21

## 2018-10-18 MED ORDER — SODIUM CHLORIDE 0.9 % IV SOLN
Freq: Once | INTRAVENOUS | Status: AC
  Administered 2018-10-18: 09:00:00 via INTRAVENOUS
  Filled 2018-10-18: qty 250

## 2018-10-18 MED ORDER — SODIUM CHLORIDE 0.9 % IV SOLN
Freq: Once | INTRAVENOUS | Status: AC
  Administered 2018-10-18: 10:00:00 via INTRAVENOUS
  Filled 2018-10-18: qty 5

## 2018-10-18 MED ORDER — SODIUM CHLORIDE 0.9% FLUSH
10.0000 mL | INTRAVENOUS | Status: DC | PRN
  Administered 2018-10-18: 10 mL
  Filled 2018-10-18: qty 10

## 2018-10-18 MED ORDER — HEPARIN SOD (PORK) LOCK FLUSH 100 UNIT/ML IV SOLN
500.0000 [IU] | Freq: Once | INTRAVENOUS | Status: AC | PRN
  Administered 2018-10-18: 500 [IU]
  Filled 2018-10-18: qty 5

## 2018-10-18 NOTE — Patient Instructions (Signed)
Coronavirus (COVID-19) Are you at risk?  Are you at risk for the Coronavirus (COVID-19)?  To be considered HIGH RISK for Coronavirus (COVID-19), you have to meet the following criteria:  . Traveled to [place], [place], [place], [place] or [place]; or in the [place] to [place], [place], [place], or [place]; and have fever, cough, and shortness of breath within the last 2 weeks of travel OR . Been in close contact with a person diagnosed with COVID-19 within the last 2 weeks and have fever, cough, and shortness of breath . IF YOU DO NOT MEET THESE CRITERIA, YOU ARE CONSIDERED LOW RISK FOR COVID-19.  What to do if you are HIGH RISK for COVID-19?  . If you are having a medical emergency, call 911. . Seek medical care right away. Before you go to a doctor's office, urgent care or emergency department, call ahead and tell them about your recent travel, contact with someone diagnosed with COVID-19, and your symptoms. You should receive instructions from your physician's office regarding next steps of care.  . When you arrive at healthcare provider, tell the healthcare staff immediately you have returned from visiting [place], [place], [place], [place] or [place]; or traveled in the [place] to [place], [place], [place], or [place]; in the last two weeks or you have been in close contact with a person diagnosed with COVID-19 in the last 2 weeks.   . Tell the health care staff about your symptoms: fever, cough and shortness of breath. . After you have been seen by a medical provider, you will be either: o Tested for (COVID-19) and discharged home on quarantine except to seek medical care if symptoms worsen, and asked to  - Stay home and avoid contact with others until you get your results (4-5 days)  - Avoid travel on public transportation if possible (such as bus, train, or airplane) or o Sent to the Emergency Department by EMS for evaluation, COVID-19 testing, and possible  admission depending on your condition and test results.  What to do if you are LOW RISK for COVID-19?  Reduce your risk of any infection by using the same precautions used for avoiding the common cold or flu:  . Wash your hands often with soap and warm water for at least 20 seconds.  If soap and water are not readily available, use an alcohol-based hand sanitizer with at least 60% alcohol.  . If coughing or sneezing, cover your mouth and nose by coughing or sneezing into the elbow areas of your shirt or coat, into a tissue or into your sleeve (not your hands). . Avoid shaking hands with others and consider head nods or verbal greetings only. . Avoid touching your eyes, nose, or mouth with unwashed hands.  . Avoid close contact with people who are sick. . Avoid places or events with large numbers of people in one location, like concerts or sporting events. . Carefully consider travel plans you have or are making. . If you are planning any travel outside or inside the [place], visit the CDC's Travelers' Health webpage for the latest health notices. . If you have some symptoms but not all symptoms, continue to monitor at home and seek medical attention if your symptoms worsen. . If you are having a medical emergency, call 911.   ADDITIONAL HEALTHCARE OPTIONS FOR PATIENTS  [facility] / e-Visit: [email or web address]         [facility]: [phone]  [facility]: [phone]                   [facility]: [phone]   

## 2018-10-22 NOTE — Progress Notes (Signed)
HEMATOLOGY/ONCOLOGY CLINIC NOTE  Date of Service: 10/23/18     Patient Care Team: Patient, No Pcp Per as PCP - General (General Practice)  CHIEF COMPLAINTS/PURPOSE OF CONSULTATION:  Nodular sclerosis Hodgkin's Lymphoma   HISTORY OF PRESENTING ILLNESS:   David Lam is a wonderful 23 y.o. male who has been referred to [place] by Dr. 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 He is accompanied today by his mother and sister. The pt reports that he is doing well overall.   The pt presented to the ED on 05/07/18 after developing chest pain over 2 days, with positional elements. The pt was evaluated with a CXR which revealed a mediastinal mass that was biopsied on 05/10/18 and confirmed nodular sclerosis Hodgkin's lymphoma, as noted below.   The pt notes that he though his chest discomfort was due to working out, and denies noticing any swelling in the area. The pt denies any recent fevers, chills, night sweats, skin rashes, unexpected weight loss. The pt notes that his chest pain continues and is making his sleeping difficult. He denies any difficulty ambulating or SOB.   The pt reports that he has not had any prior medical problems, and has had left thumb and left ankle surgeries from injuries. 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   Of note prior to the patient's visit today, pt has had a [place] lymph node biopsy completed on 05/10/18 with results revealing Nodular sclerosis Hodgkin's Lymphoma    The pt also had a CTA Chest on 05/07/18 which revealed Bulky, bilateral anterior superior mediastinal confluent soft tissue masses which are not calcified in appearance and which do not appear to cause occlusion or significant luminal narrowing of traversing vasculature. Leading consideration is lymphoma, possibly Hodgkin's. Given lack of differentiating soft tissue densities, a teratoma is believed less likely. Nonseminomatous germ cell tumor  given presence of small effusions might also be within differential though no pulmonary lesions are visualized. Metastatic disease, infection or inflammatory process are believed less likely.  The pt also had a CT A/P on 05/08/18 which revealed No acute finding or evidence of malignancy in the abdomen.  Most recent lab results (05/09/18) of CBC is as follows: all values are WNL except for WBC at 10.6k, HGB at 11.9, HCT at 34.7, MCV at 78.5, ANC at 7.9k, Eosinophils abs at 600. 05/08/18 Sed Rate at 19  On review of systems, pt reports chest pain, moving his bowels well, and denies skin rashes, fevers, chills, night sweats, unexpected weight loss, SOB, headaches, changes in vision, urinary pain or discomfort, abdominal pains, testicular pain or swelling, leg swelling, arm swelling, and any other symptoms.   On PMHx the pt reports left thumb and left ankle surgeries, denies blood transfusions. On Social Hx the pt denies alcohol consumption and denies smoking cigarettes or other recreational drugs. 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 Denies tattoos.  On Family Hx the pt denies any blood disorders or cancers.  He denies any medications.  Interval History:   David Lam returns today for management, evaluation and C3D8 Escalated BEACOPP treatment of his Classical Hodgkin's Lymphoma. The patient's last visit with [place] was on 10/16/18. He is accompanied today by his sister and mother on the phone. The pt reports that he is doing well overall.   The pt reports that he has not developed any concerns since our last visit. The pt denies fevers, chills, and night sweats. He denies  SOB or cough or concerns for infections. The pt denies any skin rashes or itching.   The pt notes that he has been stretching each day and has intentionally walked around his house frequently to fulfill [place] isolation guidelines yet remain as active as possible at this time.  The pt denies any antibiotic  allergies.  Lab results today (10/23/18) of CBC w/diff and CMP is as follows: all values are WNL except for WBC at 1.4k, RBC at 3.03, HGB at 8.3, HCT at 24.3, RDW at 19.6, ANC at 1.2k, Lymphs abs at 100, Monocytes abs at 0.0k, Glucose at 110, Calcium at 8.6, Albumin at 3.4, AST at 10, Alk Phos at 138. 10/23/18 Sed Rate is pending  On review of systems, pt reports moving his bowels well, stable energy levels, eating well, and denies fevers, chills, nigt sweats, concerns for infections, mouth sores, skin rashes, cough, SOB, itching, pain along the spine, abdominal pains, leg swelling, and any other symptoms.  MEDICAL HISTORY:  Past Medical History:  Diagnosis Date   Non Hodgkin's lymphoma ([place])    Non Hodgkin's lymphoma ([place])     SURGICAL HISTORY: Past Surgical History:  Procedure Laterality Date   ANKLE SURGERY Left    cleaned up    FRACTURE SURGERY Left    arm   IR IMAGING GUIDED PORT INSERTION  06/03/2018   MEDIASTINOTOMY CHAMBERLAIN MCNEIL N/A 05/10/2018   Procedure: Mediastinal mass biopsy ;  Surgeon: Grace Isaac, MD;  Location: [facility] OR;  Service: Thoracic;  Laterality: N/A;   ORIF FINGER / THUMB FRACTURE Left     SOCIAL HISTORY: Social History   Socioeconomic History   Marital status: Single    Spouse name: Not on file   Number of children: Not on file   Years of education: Not on file   Highest education level: Not on file  Occupational History   Not on file  Social Needs   Financial resource strain: Not on file   Food insecurity:    Worry: Not on file    Inability: Not on file   Transportation needs:    Medical: Not on file    Non-medical: Not on file  Tobacco Use   Smoking status: Never Smoker   Smokeless tobacco: Never Used  Substance and Sexual Activity   Alcohol use: Never    Frequency: Never   Drug use: Never   Sexual activity: Not on file  Lifestyle   Physical activity:    Days per week: Not on file    Minutes per session:  Not on file   Stress: Not on file  Relationships   Social connections:    Talks on phone: Not on file    Gets together: Not on file    Attends religious service: Not on file    Active member of club or organization: Not on file    Attends meetings of clubs or organizations: Not on file    Relationship status: Not on file   Intimate partner violence:    Fear of current or ex partner: Not on file    Emotionally abused: Not on file    Physically abused: Not on file    Forced sexual activity: Not on file  Other Topics Concern   Not on file  Social History Narrative   Not on file    FAMILY HISTORY: No family history on file.  ALLERGIES:  has No Known Allergies.  MEDICATIONS:  Current Outpatient Medications  Medication Sig Dispense Refill  amoxicillin-clavulanate (AUGMENTIN) 875-125 MG tablet Take 1 tablet by mouth 2 (two) times daily. 20 tablet 0   bisacodyl (DULCOLAX) 10 MG suppository Place 1 suppository (10 mg total) rectally daily as needed for moderate constipation. 12 suppository 0   Diclofenac Sodium 3 % GEL Place 1 application onto the skin 2 (two) times daily. 100 g 0   diphenhydrAMINE (BENADRYL) 25 mg capsule Take 25 mg by mouth every 6 (six) hours as needed for allergies.      HYDROcodone-acetaminophen (NORCO) 7.5-325 MG tablet Take 1 tablet by mouth every 6 (six) hours as needed for moderate pain. 30 tablet 0   levofloxacin (LEVAQUIN) 500 MG tablet Take 1 tablet (500 mg total) by mouth daily. Canceled Zpak prescription 10 tablet 0   lidocaine (LIDODERM) 5 % Place 1 patch onto the skin daily. Remove & Discard patch within 12 hours or as directed by MD 15 patch 0   lidocaine-prilocaine (EMLA) cream Apply 1 application topically as needed. (Patient taking differently: Apply 1 application topically as needed (port access). ) 30 g 0   LORazepam (ATIVAN) 0.5 MG tablet Take 1 tablet (0.5 mg total) by mouth every 6 (six) hours as needed (Nausea or vomiting). 30  tablet 0   methocarbamol (ROBAXIN) 500 MG tablet Take 1 tablet (500 mg total) by mouth every 6 (six) hours as needed for muscle spasms. 30 tablet 0   ondansetron (ZOFRAN) 8 MG tablet Take 1 tablet (8 mg total) by mouth 2 (two) times daily as needed. Start on the third day after doxorubicin/cytoxan chemotherapy. 30 tablet 2   polyethylene glycol (MIRALAX / GLYCOLAX) packet Take 17 g by mouth daily. 14 each 0   procarbazine (MATULANE) 50 MG capsule Take 4 capsules (200 mg total) by mouth daily. Take daily on days 1-7 of each 21 day cycle. Follow low tyramine diet. 28 capsule 2   prochlorperazine (COMPAZINE) 10 MG tablet Take 1 tablet (10 mg total) by mouth every 6 (six) hours as needed for nausea or vomiting. 30 tablet 2   rivaroxaban (XARELTO) 20 MG TABS tablet Take 1 tablet (20 mg total) by mouth daily with supper. 30 tablet 4   senna-docusate (SENOKOT-S) 8.6-50 MG tablet Take 1 tablet by mouth 2 (two) times daily. 20 tablet 0   No current facility-administered medications for this visit.     REVIEW OF SYSTEMS:    A 10+ POINT REVIEW OF SYSTEMS WAS OBTAINED including neurology, dermatology, psychiatry, cardiac, respiratory, lymph, extremities, GI, GU, Musculoskeletal, constitutional, breasts, reproductive, HEENT.  All pertinent positives are noted in the HPI.  All others are negative.   PHYSICAL EXAMINATION: ECOG PERFORMANCE STATUS: 1 - Symptomatic but completely ambulatory  . Vitals:   10/23/18 1226  BP: 131/76  Pulse: (!) 102  Resp: 17  Temp: 98.6 F (37 C)  SpO2: 100%   Filed Weights   10/23/18 1226  Weight: 192 lb 4.8 oz (87.2 kg)   .Body mass index is 29.24 kg/m.  GENERAL:alert, in no acute distress and comfortable SKIN: no acute rashes, no significant lesions EYES: conjunctiva are pink and non-injected, sclera anicteric OROPHARYNX: MMM, no exudates, no oropharyngeal erythema or ulceration NECK: supple, no JVD LYMPH: no palpable lymphadenopathy in the cervical,  axillary or inguinal regions LUNGS: clear to auscultation b/l with normal respiratory effort HEART: regular rate & rhythm ABDOMEN:  normoactive bowel sounds , non tender, not distended. No palpable hepatosplenomegaly.  Extremity: no pedal edema PSYCH: alert & oriented x 3 with fluent speech NEURO: no focal  motor/sensory deficits   LABORATORY DATA:  I have reviewed the data as listed  . CBC Latest Ref Rng & Units 10/23/2018 10/16/2018 10/02/2018  WBC 4.0 - 10.5 K/uL 1.4(L) 14.4(H) 0.1(LL)  Hemoglobin 13.0 - 17.0 g/dL 8.3(L) 10.3(L) 8.9(L)  Hematocrit 39.0 - 52.0 % 24.3(L) 31.0(L) 26.2(L)  Platelets 150 - 400 K/uL 207 182 228   ANC 1200 . CMP Latest Ref Rng & Units 10/23/2018 10/16/2018 10/02/2018  Glucose 70 - 99 mg/dL 110(H) 142(H) 95  BUN 6 - 20 mg/dL _0 Creatinine 0.61 - 1.24 mg/dL 0.78 0.86 0.78  Sodium 135 - 145 mmol/L 140 139 138  Potassium 3.5 - 5.1 mmol/L 3.6 4.0 4.5  Chloride 98 - 111 mmol/L 104 104 102  CO2 22 - 32 mmol/L _1 Calcium 8.9 - 10.3 mg/dL 8.6(L) 9.3 9.2  Total Protein 6.5 - 8.1 g/dL 6.8 7.7 7.3  Total Bilirubin 0.3 - 1.2 mg/dL 0.3 0.3 0.4  Alkaline Phos 38 - 126 U/L 138(H) 182(H) 165(H)  AST 15 - 41 U/L 10(L) 17 12(L)  ALT 0 - 44 U/L 26 24 38   05/10/18 Biopsy:   05/22/18 BM Bx:    RADIOGRAPHIC STUDIES: I have personally reviewed the radiological images as listed and agreed with the findings in the report. Dg Chest 2 View  Result Date: 09/28/2018 CLINICAL DATA:  Hodgkin's lymphoma.  Possible infection. EXAM: CHEST - 2 VIEW COMPARISON:  09/16/2018 FINDINGS: Right Port-A-Cath in place with the tip near the cavoatrial junction. Heart and mediastinal contours are within normal limits. No focal opacities or effusions. No acute bony abnormality. IMPRESSION: No active cardiopulmonary disease. Electronically Signed   By: Rolm Baptise M.D.   On: 09/28/2018 20:03   Nm Pet Image Restag (ps) Skull Base To Thigh  Result Date: 10/16/2018 CLINICAL DATA:   Subsequent treatment strategy for Hodgkin's lymphoma. EXAM: NUCLEAR MEDICINE PET SKULL BASE TO THIGH TECHNIQUE: 9.0 mCi F-18 FDG was injected intravenously. Full-ring PET imaging was performed from the skull base to thigh after the radiotracer. CT data was obtained and used for attenuation correction and anatomic localization. Fasting blood glucose: 102 mg/dl COMPARISON:  08/16/2018 PET.  Chest CT 09/04/2018. FINDINGS: Mediastinal blood pool activity: SUV max 1.7 Liver activity: a S.U.V. max of 3.1 NECK: No cervical nodal hypermetabolism. Minimal subcutaneous nodularity superficial to the left side of the mandible measures a S.U.V. max of 5.5 on image 31/4 and is new. Incidental CT findings: No cervical adenopathy. CHEST: Right paratracheal node measures 1.4 cm and a S.U.V. max of 7.8. Compare 1.6 cm and a S.U.V. max of 10.3 on the prior. Prevascular nodal mass measures 3.4 x 2.7 cm and a S.U.V. max of 4.9 today. Compare 4.7 x 3.7 cm and a S.U.V. max of 6.3 on the prior. Right cardiophrenic angle nodal mass measures 4.8 x 3.3 cm and a S.U.V. max of 11.7. Compare 7.3 x 4.3 cm and a S.U.V. max of 14.1 on the prior. Incidental CT findings: Mild cardiomegaly. Trace left pleural thickening. Right Port-A-Cath tip at mid right atrium. ABDOMEN/PELVIS: No abdominopelvic parenchymal or nodal hypermetabolism. Incidental CT findings: Normal adrenal glands. No abdominopelvic adenopathy. No splenomegaly. SKELETON: Diffuse mild marrow hypermetabolism. Incidental CT findings: none IMPRESSION: 1. Further response to therapy of hypermetabolic thoracic adenopathy. (Deau[place] 4) 2. Diffuse marrow hypermetabolism likely relates to marrow stimulation by chemotherapy. 3. Mild hypermetabolism and subcutaneous nodularity superficial to the left mandible. Indeterminate. Consider physical exam correlation. Electronically Signed   By: Marylyn Ishihara  Jobe Igo M.D.   On: 10/16/2018 09:19    ASSESSMENT & PLAN:   23 y.o. male with  1. Nodular  sclerosis Hodgkin's Lymphoma Stage I/II Bulky disease  05/07/18 CTA Chest revealed Bulky, bilateral anterior superior mediastinal confluent soft tissue masses which are not calcified in appearance and which do not appear to cause occlusion or significant luminal narrowing of traversing vasculature. Leading consideration is lymphoma, possibly Hodgkin's. Given lack of differentiating soft tissue densities, a teratoma is believed less likely. Nonseminomatous germ cell tumor given presence of small effusions might also be within differential though no pulmonary lesions are visualized. Metastatic disease, infection or inflammatory process are believed less likely.    05/08/18 CT A/P revealed No acute finding or evidence of malignancy in the abdomen.  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 No hypermetabolic lymph nodes within the neck, abdomen or pelvis.  05/22/18 BM Bx was negative   S/p 3 cycles of ABVD  08/16/18 PET/CT revealed Since the prior PET of 05/21/2018, mild disease progression, as evidenced by increased activity and less so size of hypermetabolic thoracic nodes. ([place] 4) 2. No new or extrathoracic sites of disease. 3. Significant hypermetabolic "brown" fat throughout the neck and chest, mildly degrading evaluation. Discussed the 08/16/18 PET/CT with radiology - confirmed concern for residual [place] 4 disease   S/p 2 cycles of Escalated BEACOPP. Held C2D8 due to significant neutropenia.  10/16/18 PET/CT revealed Further response to therapy of hypermetabolic thoracic adenopathy. ([place] 4) 2. Diffuse marrow hypermetabolism likely relates to marrow stimulation by chemotherapy. 3. Mild  hypermetabolism and subcutaneous nodularity superficial to the left mandible. Indeterminate. Consider physical exam correlation.  2. B/L great toe subungal hematomas - likely from chemotherapy -podiatry referral given -Patient saw Podiatry, and toe nail was removed -Continue Epsom salt soaks   3. Pulmonary Embolism 09/04/18 CTA Chest revealed Positive for 2 acute subsegmental pulmonary emboli in the right upper lobe. 2. Heterogeneous aeration diffusely, likely small airways disease with air trapping given there is also generalized bronchial wall thickening. 3. 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 Tumor as risk factor for PE  4) Severe Neutropenia - no fever.  PLAN: -Discussed pt labwork today, 10/23/18; WBC at 1.4k with [place] at 1.2k. HGB at 8.3. PLT at 207k. -10/23/18 Sed Rate is pending -Pt will proceed with C3D8 Escalated BEACOPP today -Pt will hold Procarbazine at home this week -Neulasta support on 10/25/18 -Will repeat PET/CT in 6 weeks -Given patient's somewhat resistant disease, which was initially bulky, I recommended that he discuss considerations for RT further with Rad Onc and am providing a referral -Advised strict infection prevention precautions -Given paper prescription for antibiotics, which pt will fill if he develops a fever -Recommend salt and baking soda mouthwashes 4-5 times a day -Will continue 73m Xarelto for at least 6 months total, and in the absence of malignancy or other provoking risk factors -Discussed that the pt should avoid elective procedures while on blood thinners -Regarding herniated disc: Pain management with local lidocaine patch, Diclofenac gel, and Norco, local heat application, outpatient physical therapy referral, and sleeping with C shaped pillow. Also recommend lower back belt if needed. -Recommend prophylactic Claritin up to one week prior to Neulasta, and  additional week after Neulasta, with each cycle. -Continue Senna S and  Miralax -Will refill Norco, and Robaxin only as needed -Pt will avoid NSAIDs -Continue Vitamin B complex -Will see the pt back in 7 weeks   Plz cancel next cycle of chemotherapy currently scheduled Cancel Dr Grier Mitts appointment on 11/06/2018 Keep Labs appointment for 11/06/2018 PET/CT in 6 weeks RTC with Dr Irene Limbo in 7 weeks Radiation oncology appointment in 7 weeks after PET/CT for consideration of ISRT for Hodgkins lymphoma   All of the patients questions were answered with apparent satisfaction. The patient knows to call the clinic with any problems, questions or concerns.  The total time spent in the appt was 30 minutes and more than 50% was on counseling and direct patient cares.    Sullivan Lone MD MS AAHIVMS [facility] Hematology/Oncology Physician [facility]  (Office):       [phone] (Work cell):  [phone] (Fax):           [phone]  10/23/2018 1:41 PM   I, Baldwin [place], am acting as a scribe for Dr. Sullivan Lone.   .I have reviewed the above documentation for accuracy and completeness, and I agree with the above. [name] MD

## 2018-10-23 ENCOUNTER — Ambulatory Visit: Payer: BLUE CROSS/BLUE SHIELD

## 2018-10-23 ENCOUNTER — Inpatient Hospital Stay: Payer: BC Managed Care – PPO | Attending: Hematology

## 2018-10-23 ENCOUNTER — Inpatient Hospital Stay (HOSPITAL_BASED_OUTPATIENT_CLINIC_OR_DEPARTMENT_OTHER): Payer: BC Managed Care – PPO | Admitting: Hematology

## 2018-10-23 ENCOUNTER — Inpatient Hospital Stay: Payer: BC Managed Care – PPO

## 2018-10-23 ENCOUNTER — Other Ambulatory Visit: Payer: Self-pay

## 2018-10-23 ENCOUNTER — Telehealth: Payer: Self-pay | Admitting: Hematology

## 2018-10-23 ENCOUNTER — Telehealth: Payer: Self-pay | Admitting: Radiation Oncology

## 2018-10-23 VITALS — HR 99

## 2018-10-23 VITALS — BP 131/76 | HR 102 | Temp 98.6°F | Resp 17 | Ht 68.0 in | Wt 192.3 lb

## 2018-10-23 DIAGNOSIS — Z5111 Encounter for antineoplastic chemotherapy: Secondary | ICD-10-CM

## 2018-10-23 DIAGNOSIS — I2699 Other pulmonary embolism without acute cor pulmonale: Secondary | ICD-10-CM

## 2018-10-23 DIAGNOSIS — C8118 Nodular sclerosis classical Hodgkin lymphoma, lymph nodes of multiple sites: Secondary | ICD-10-CM

## 2018-10-23 DIAGNOSIS — C8190 Hodgkin lymphoma, unspecified, unspecified site: Secondary | ICD-10-CM

## 2018-10-23 DIAGNOSIS — C8110 Nodular sclerosis classical Hodgkin lymphoma, unspecified site: Secondary | ICD-10-CM

## 2018-10-23 DIAGNOSIS — Z5189 Encounter for other specified aftercare: Secondary | ICD-10-CM | POA: Insufficient documentation

## 2018-10-23 DIAGNOSIS — Z95828 Presence of other vascular implants and grafts: Secondary | ICD-10-CM

## 2018-10-23 LAB — CBC WITH DIFFERENTIAL/PLATELET
Abs Immature Granulocytes: 0.07 10*3/uL (ref 0.00–0.07)
Basophils Absolute: 0 10*3/uL (ref 0.0–0.1)
Basophils Relative: 1 %
Eosinophils Absolute: 0 10*3/uL (ref 0.0–0.5)
Eosinophils Relative: 0 %
HCT: 24.3 % — ABNORMAL LOW (ref 39.0–52.0)
Hemoglobin: 8.3 g/dL — ABNORMAL LOW (ref 13.0–17.0)
Immature Granulocytes: 5 %
Lymphocytes Relative: 10 %
Lymphs Abs: 0.1 10*3/uL — ABNORMAL LOW (ref 0.7–4.0)
MCH: 27.4 pg (ref 26.0–34.0)
MCHC: 34.2 g/dL (ref 30.0–36.0)
MCV: 80.2 fL (ref 80.0–100.0)
Monocytes Absolute: 0 10*3/uL — ABNORMAL LOW (ref 0.1–1.0)
Monocytes Relative: 1 %
Neutro Abs: 1.2 10*3/uL — ABNORMAL LOW (ref 1.7–7.7)
Neutrophils Relative %: 83 %
Platelets: 207 10*3/uL (ref 150–400)
RBC: 3.03 MIL/uL — ABNORMAL LOW (ref 4.22–5.81)
RDW: 19.6 % — ABNORMAL HIGH (ref 11.5–15.5)
WBC: 1.4 10*3/uL — ABNORMAL LOW (ref 4.0–10.5)
nRBC: 0 % (ref 0.0–0.2)

## 2018-10-23 LAB — CMP (CANCER CENTER ONLY)
ALT: 26 U/L (ref 0–44)
AST: 10 U/L — ABNORMAL LOW (ref 15–41)
Albumin: 3.4 g/dL — ABNORMAL LOW (ref 3.5–5.0)
Alkaline Phosphatase: 138 U/L — ABNORMAL HIGH (ref 38–126)
Anion gap: 10 (ref 5–15)
BUN: 11 mg/dL (ref 6–20)
CO2: 26 mmol/L (ref 22–32)
Calcium: 8.6 mg/dL — ABNORMAL LOW (ref 8.9–10.3)
Chloride: 104 mmol/L (ref 98–111)
Creatinine: 0.78 mg/dL (ref 0.61–1.24)
GFR, Est AFR Am: >60 mL/min (ref >60)
GFR, Estimated: >60 mL/min (ref >60)
Glucose, Bld: 110 mg/dL — ABNORMAL HIGH (ref 70–99)
Potassium: 3.6 mmol/L (ref 3.5–5.1)
Sodium: 140 mmol/L (ref 135–145)
Total Bilirubin: 0.3 mg/dL (ref 0.3–1.2)
Total Protein: 6.8 g/dL (ref 6.5–8.1)

## 2018-10-23 LAB — SEDIMENTATION RATE: Sed Rate: 34 mm/hr — ABNORMAL HIGH (ref 0–16)

## 2018-10-23 MED ORDER — PROCHLORPERAZINE MALEATE 10 MG PO TABS
ORAL_TABLET | ORAL | Status: AC
  Filled 2018-10-23: qty 1

## 2018-10-23 MED ORDER — VINCRISTINE SULFATE CHEMO INJECTION 1 MG/ML
2.0000 mg | Freq: Once | INTRAVENOUS | Status: AC
  Administered 2018-10-23: 2 mg via INTRAVENOUS
  Filled 2018-10-23: qty 2

## 2018-10-23 MED ORDER — AMOXICILLIN-POT CLAVULANATE 875-125 MG PO TABS: 1.0000 | ORAL_TABLET | Freq: Two times a day (BID) | ORAL | 0 refills | Status: DC

## 2018-10-23 MED ORDER — PROCHLORPERAZINE MALEATE 10 MG PO TABS
10.0000 mg | ORAL_TABLET | Freq: Once | ORAL | Status: AC
  Administered 2018-10-23: 10 mg via ORAL

## 2018-10-23 MED ORDER — SODIUM CHLORIDE 0.9 % IV SOLN
750.0000 mL | Freq: Once | INTRAVENOUS | Status: AC
  Administered 2018-10-23: 14:00:00 250 mL via INTRAVENOUS
  Filled 2018-10-23: qty 750

## 2018-10-23 MED ORDER — SODIUM CHLORIDE 0.9% FLUSH
10.0000 mL | INTRAVENOUS | Status: DC | PRN
  Administered 2018-10-23: 10 mL
  Filled 2018-10-23: qty 10

## 2018-10-23 MED ORDER — SODIUM CHLORIDE 0.9 % IV SOLN
10.0000 [IU]/m2 | Freq: Once | INTRAVENOUS | Status: AC
  Administered 2018-10-23: 21 [IU] via INTRAVENOUS
  Filled 2018-10-23: qty 7

## 2018-10-23 NOTE — Telephone Encounter (Signed)
Scheduled appts per 4/1 los.  Patient aware of appt dates and time.  Sent referral thru proficient.

## 2018-10-23 NOTE — Patient Instructions (Signed)
[facility] Discharge Instructions for Patients Receiving Chemotherapy  Today you received the following chemotherapy agents Bleocin, Vincristine  To help prevent nausea and vomiting after your treatment, we encourage you to take your nausea medication.   If you develop nausea and vomiting that is not controlled by your nausea medication, call the clinic.   BELOW ARE SYMPTOMS THAT SHOULD BE REPORTED IMMEDIATELY:  *FEVER GREATER THAN 100.5 F  *CHILLS WITH OR WITHOUT FEVER  NAUSEA AND VOMITING THAT IS NOT CONTROLLED WITH YOUR NAUSEA MEDICATION  *UNUSUAL SHORTNESS OF BREATH  *UNUSUAL BRUISING OR BLEEDING  TENDERNESS IN MOUTH AND THROAT WITH OR WITHOUT PRESENCE OF ULCERS  *URINARY PROBLEMS  *BOWEL PROBLEMS  UNUSUAL RASH Items with * indicate a potential emergency and should be followed up as soon as possible.  Feel free to call the clinic should you have any questions or concerns. The clinic phone number is [phone].  Please show the [place] at check-in to the Emergency Department and triage nurse.

## 2018-10-23 NOTE — Telephone Encounter (Signed)
New message:    Lft vcmail with patient to return call to schedule from referral received

## 2018-10-25 ENCOUNTER — Other Ambulatory Visit: Payer: BLUE CROSS/BLUE SHIELD

## 2018-10-25 ENCOUNTER — Ambulatory Visit: Payer: BLUE CROSS/BLUE SHIELD

## 2018-10-25 ENCOUNTER — Other Ambulatory Visit: Payer: Self-pay

## 2018-10-25 ENCOUNTER — Ambulatory Visit: Payer: BLUE CROSS/BLUE SHIELD | Admitting: Hematology

## 2018-10-25 ENCOUNTER — Inpatient Hospital Stay: Payer: BC Managed Care – PPO

## 2018-10-25 VITALS — BP 143/67 | HR 113 | Temp 98.3°F | Resp 18

## 2018-10-25 DIAGNOSIS — C8110 Nodular sclerosis classical Hodgkin lymphoma, unspecified site: Secondary | ICD-10-CM

## 2018-10-25 DIAGNOSIS — C8118 Nodular sclerosis classical Hodgkin lymphoma, lymph nodes of multiple sites: Secondary | ICD-10-CM

## 2018-10-25 DIAGNOSIS — Z5111 Encounter for antineoplastic chemotherapy: Secondary | ICD-10-CM | POA: Diagnosis not present

## 2018-10-25 MED ORDER — PEGFILGRASTIM-CBQV 6 MG/0.6ML ~~LOC~~ SOSY
PREFILLED_SYRINGE | SUBCUTANEOUS | Status: AC
  Filled 2018-10-25: qty 0.6

## 2018-10-25 MED ORDER — PEGFILGRASTIM-CBQV 6 MG/0.6ML ~~LOC~~ SOSY
6.0000 mg | PREFILLED_SYRINGE | Freq: Once | SUBCUTANEOUS | Status: AC
  Administered 2018-10-25: 10:00:00 6 mg via SUBCUTANEOUS

## 2018-10-29 ENCOUNTER — Ambulatory Visit: Payer: BC Managed Care – PPO | Attending: Hematology | Admitting: Rehabilitation

## 2018-10-29 ENCOUNTER — Encounter: Payer: Self-pay | Admitting: Rehabilitation

## 2018-10-29 ENCOUNTER — Other Ambulatory Visit: Payer: Self-pay

## 2018-10-29 DIAGNOSIS — M25652 Stiffness of left hip, not elsewhere classified: Secondary | ICD-10-CM | POA: Diagnosis present

## 2018-10-29 DIAGNOSIS — M5442 Lumbago with sciatica, left side: Secondary | ICD-10-CM | POA: Diagnosis not present

## 2018-10-29 DIAGNOSIS — M5441 Lumbago with sciatica, right side: Secondary | ICD-10-CM | POA: Insufficient documentation

## 2018-10-29 DIAGNOSIS — M62838 Other muscle spasm: Secondary | ICD-10-CM | POA: Diagnosis present

## 2018-10-29 DIAGNOSIS — M25651 Stiffness of right hip, not elsewhere classified: Secondary | ICD-10-CM | POA: Diagnosis present

## 2018-10-29 NOTE — Therapy (Signed)
[place], [place], [address] Phone: [phone]   Fax:  [phone]  Physical Therapy Evaluation  Patient Details  Name: David Lam MRN: [id number] Date of Birth: Dec 09, 1995 Referring Provider (PT): Dr. Irene Limbo   Encounter Date: 10/29/2018  PT End of Session - 10/29/18 1151    Visit Number  1    Number of Visits  12    Date for PT Re-Evaluation  01/06/19    Authorization Type  100 OT/PT    PT Start Time  1000    PT Stop Time  1045    PT Time Calculation (min)  45 min    Activity Tolerance  Patient tolerated treatment well    Behavior During Therapy  [facility] for tasks assessed/performed       Past Medical History:  Diagnosis Date  . Non Hodgkin's lymphoma ([place])   . Non Hodgkin's lymphoma [facility])     Past Surgical History:  Procedure Laterality Date  . ANKLE SURGERY Left    cleaned up   . FRACTURE SURGERY Left    arm  . IR IMAGING GUIDED PORT INSERTION  06/03/2018  . MEDIASTINOTOMY CHAMBERLAIN MCNEIL N/A 05/10/2018   Procedure: Mediastinal mass biopsy ;  Surgeon: Grace Isaac, MD;  Location: [facility] OR;  Service: Thoracic;  Laterality: N/A;  . ORIF FINGER / THUMB FRACTURE Left     There were no vitals filed for this visit.   Subjective Assessment - 10/29/18 1101    Subjective  Some recent back pain about 66month ago that lasted for 2 weeks and then went away.  Never had any back pain in the past.  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 History of 09/04/18 CTA Chest revealed Positive for 2 acute subsegmental pulmonary emboli in the right upper lobe and severe neutropenia without fever on last visit.  Done with all infusions as of last week.  Will meet with radiation soon to see if that is needed     Limitations  --   not going to the gym due to it closed   Patient Stated Goals  MRI 09/16/18:  L5-S1 central and left subarticular disc protrusion with annular fissure contacting the descending S1 nerve roots in the lateral recesses as well as resulting in mild bilateral foraminal stenosis    Currently in Pain?  No/denies    Pain Score  0-No pain   none in the past 24 hours        [facility] PT Assessment - 10/29/18 0001      Assessment   Medical Diagnosis  disc herniation L5/S1 resolved symptoms    Referring Provider (PT)  Dr. Irene Limbo    Onset Date/Surgical Date  09/16/18    Hand Dominance  Right    Next MD Visit  radiation visit    Prior Therapy  no      Precautions   Precaution Comments  active cancer      Restrictions   Weight Bearing Restrictions  No      Balance Screen   Has the patient fallen in the past 6 months  No    Has the patient had a decrease in activity level because of a fear of falling?   No    Is the patient reluctant to leave their home because of a fear of falling?   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 Flexion to the floor with good curve, no extension hinge level    AROM Assessment Site  Lumbar      PROM   Overall PROM Comments  tightness only in hip ER bilaterally      Strength   Overall Strength Comments  supine LE MMT 5/5 globally without pain      Flexibility   Soft Tissue Assessment /Muscle Length   yes    Hamstrings  to 80    Quadriceps  full    ITB  slight tightness bil    Piriformis  moderate tightness muscle vs hip ROM?      Special Tests    Special Tests  Hip Special Tests    Other special tests  thomas test normal                Objective measurements completed on examination: See above findings.      [facility] PT Treatment/Exercise - 10/29/18 0001      Exercises   Exercises  Other Exercises    Other Exercises   gave pt supine figure 4 and piriformis stretch for home until can return             PT Education - 10/29/18 1150    Education Details  briefly core importance and training, briefly lifting, new stretches, POC    Person(s) Educated  Patient    Methods  Explanation    Comprehension  Verbalized understanding;Returned demonstration;Verbal cues required;Tactile cues required;Need further instruction          PT Long Term Goals - 10/29/18 1156      PT LONG TERM GOAL #1   Title  Pt will be able to demonstrate core strengthening in the clinic with appropriate transverse abominus and multifidus activation     Status  New    Target Date  01/06/19      PT LONG TERM GOAL #2   Title  Pt will be ind with final HEP for continued core and back strengthening/hip mobility    Status  New    Target Date  01/06/19      PT LONG TERM GOAL #3   Title  Pt will have no rectus spasm with core activation     Status  New    Target Date  01/06/19      PT LONG TERM GOAL #4   Title  Pt will be aware of lifting modifications to make to avoid disk reaggravation    Target Date  01/06/19             Plan - 10/29/18 1152  Clinical Impression Statement  Pt presents today without pain or significant need for PT at this time during [place] clinic restrictions.  Pt was given pirformis/hip stretches and told to do these until clinic reopens for a normal course of core strengthening.  Pt is doing well overall with no lumbar ROM limitations or pain, some  tightness in bilateral hips and the need for core strength/HEP to prevent future HNP flare up.  Pt agreeable to this plan.  Pt also presents with reports of intermittent Rt rectus abominus spasm and pain that occurs with activation that will be looked at closer during PT    Stability/Clinical Decision Making  Stable/Uncomplicated    Clinical Decision Making  Low    Rehab Potential  Excellent    PT Frequency  2x / week    PT Duration  6 weeks   with clinic reopen   PT Treatment/Interventions  ADLs/Self Care Home Management;Neuromuscular re-education;Therapeutic exercise;Patient/family education;Manual techniques;Passive range of motion    PT Next Visit Plan  reassess when back, begin core reducation and lifting education    PT Home Exercise Plan  Access Code: [email or web address]     Consulted and Agree with Plan of Care  Patient       Patient will benefit from skilled therapeutic intervention in order to improve the following deficits and impairments:  Increased muscle spasms, Decreased knowledge of precautions, Impaired flexibility  Visit Diagnosis: Acute midline low back pain with bilateral sciatica  Other muscle spasm  Stiffness of left hip, not elsewhere classified  Stiffness of right hip, not elsewhere classified     Problem List Patient Active Problem List   Diagnosis Date Noted  . Neutropenia with fever ([place]) 09/16/2018  . Back pain 09/16/2018  . Pulmonary embolism ([place]) 09/16/2018  . Hodgkin's lymphoma ([place]) 08/30/2018  . Port-A-Cath in place 07/05/2018  . Hodgkin lymphoma ([place]) 06/05/2018  . Chest pain 05/07/2018  . 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 Rupture of radial collateral ligament of left thumb 12/30/2014  . Left ankle pain 11/16/2014  . Closed fracture of shaft of left radius and ulna 10/31/2014  . Dislocation of carpometacarpal joint of left thumb 10/31/2014  Shan Levans, PT 10/29/2018, 11:59 AM  [place], [place], [address] Phone: [phone]   Fax:  [phone]  Name: David Lam MRN: [id number] Date of Birth: 02-01-1996

## 2018-10-29 NOTE — Patient Instructions (Signed)
Access Code: [email or web address]  URL: [email or web address]  Date: 10/29/2018  Prepared by: Shan Levans   Exercises  Supine Piriformis Stretch - 10 reps - 1-3 sets - 20-30 seconds hold - 1x daily - 7x weekly  Supine Figure 4 Piriformis Stretch with Leg Extension - 10 reps - 1-3 sets - 20-30 seconds hold - 1x daily - 7x weekly   Avoid resistance twisting at the gym and watch how you lift things  Will return to PT after COVID19

## 2018-11-04 ENCOUNTER — Other Ambulatory Visit: Payer: Self-pay | Admitting: Hematology

## 2018-11-04 ENCOUNTER — Telehealth: Payer: Self-pay | Admitting: *Deleted

## 2018-11-04 NOTE — Telephone Encounter (Signed)
Patient called to stated he has a knot on his clavicle and wanted to talk with Dr.Kale about it. Dr. Irene Limbo advised patient to be seen - appt added for 4/15 with Dr.Kale at 8:40 am. Patient verbalized understanding of time. Schedule msg sent.

## 2018-11-05 NOTE — Progress Notes (Signed)
HEMATOLOGY/ONCOLOGY CLINIC NOTE  Date of Service: 11/06/18     Patient Care Team: Patient, No Pcp Per as PCP - General (General Practice)  CHIEF COMPLAINTS/PURPOSE OF CONSULTATION:  Nodular sclerosis Hodgkin's Lymphoma   HISTORY OF PRESENTING ILLNESS:   David Lam is a wonderful 23 y.o. male who has been referred to [place] by Dr. 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 He is accompanied today by his mother and sister. The pt reports that he is doing well overall.   The pt presented to the ED on 05/07/18 after developing chest pain over 2 days, with positional elements. The pt was evaluated with a CXR which revealed a mediastinal mass that was biopsied on 05/10/18 and confirmed nodular sclerosis Hodgkin's lymphoma, as noted below.   The pt notes that he though his chest discomfort was due to working out, and denies noticing any swelling in the area. The pt denies any recent fevers, chills, night sweats, skin rashes, unexpected weight loss. The pt notes that his chest pain continues and is making his sleeping difficult. He denies any difficulty ambulating or SOB.   The pt reports that he has not had any prior medical problems, and has had left thumb and left ankle surgeries from injuries. 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   Of note prior to the patient's visit today, pt has had a Scal[place] lymph node biopsy completed on 05/10/18 with results revealing Nodular sclerosis Hodgkin's Lymphoma    The pt also had a CTA Chest on 05/07/18 which revealed Bulky, bilateral anterior superior mediastinal confluent soft tissue masses which are not calcified in appearance and which do not appear to cause occlusion or significant luminal narrowing of traversing vasculature. Leading consideration is lymphoma, possibly Hodgkin's. Given lack of differentiating soft tissue densities, a teratoma is believed less likely. Nonseminomatous germ cell tumor  given presence of small effusions might also be within differential though no pulmonary lesions are visualized. Metastatic disease, infection or inflammatory process are believed less likely.  The pt also had a CT A/P on 05/08/18 which revealed No acute finding or evidence of malignancy in the abdomen.  Most recent lab results (05/09/18) of CBC is as follows: all values are WNL except for WBC at 10.6k, HGB at 11.9, HCT at 34.7, MCV at 78.5, ANC at 7.9k, Eosinophils abs at 600. 05/08/18 Sed Rate at 19  On review of systems, pt reports chest pain, moving his bowels well, and denies skin rashes, fevers, chills, night sweats, unexpected weight loss, SOB, headaches, changes in vision, urinary pain or discomfort, abdominal pains, testicular pain or swelling, leg swelling, arm swelling, and any other symptoms.   On PMHx the pt reports left thumb and left ankle surgeries, denies blood transfusions. On Social Hx the pt denies alcohol consumption and denies smoking cigarettes or other recreational drugs. 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 Denies tattoos.  On Family Hx the pt denies any blood disorders or cancers.  He denies any medications.  Interval History:   David Lam returns today for management, evaluation after C3 Escalated BEACOPP treatment of his Classical Hodgkin's Lymphoma. The patient's last visit with [place] was on 10/23/18. The pt reports that he is doing well overall.   The pt reports that he has developed a small lump near his left clavicle and notes that this has gone down on its own over the last couple days. He denies any associated pain. He has been  stretching but denies lifting anything heavy. He denies fevers, chills, night sweats, concerns for infections, unexpected weight loss, mouth sores, diarrhea, and any other symptoms.  The pt notes that he has been avoiding public spaces. He tolerated his last C3 of escalated BEACOPP well.   Lab results today (11/06/18) of  CBC w/diff and CMP is as follows: all values are WNL except for WBC at 19.4k, RBC at 3.46, HGB at 9.6, HCT at 29.1 ,RDW at 22.3, nRBC at 2.4, ANC at 15.7k, Monocytes abs at 1.2k, Abs immature granulocytes at 1.19k, Alk Phos at 133.  On review of systems, pt reports good energy levels, recent small lump at left collar bone/sternum, and denies fevers, chills, night sweats, concerns for infections, mouth sores, diarrhea, and any other symptoms.  MEDICAL HISTORY:  Past Medical History:  Diagnosis Date   Non Hodgkin's lymphoma ([place])    Non Hodgkin's lymphoma ([place])     SURGICAL HISTORY: Past Surgical History:  Procedure Laterality Date   ANKLE SURGERY Left    cleaned up    FRACTURE SURGERY Left    arm   IR IMAGING GUIDED PORT INSERTION  06/03/2018   MEDIASTINOTOMY CHAMBERLAIN MCNEIL N/A 05/10/2018   Procedure: Mediastinal mass biopsy ;  Surgeon: Grace Isaac, MD;  Location: [facility];  Service: Thoracic;  Laterality: N/A;   ORIF FINGER / THUMB FRACTURE Left     SOCIAL HISTORY: Social History   Socioeconomic History   Marital status: Single    Spouse name: Not on file   Number of children: Not on file   Years of education: Not on file   Highest education level: Not on file  Occupational History   Not on file  Social Needs   Financial resource strain: Not on file   Food insecurity:    Worry: Not on file    Inability: Not on file   Transportation needs:    Medical: Not on file    Non-medical: Not on file  Tobacco Use   Smoking status: Never Smoker   Smokeless tobacco: Never Used  Substance and Sexual Activity   Alcohol use: Never    Frequency: Never   Drug use: Never   Sexual activity: Not on file  Lifestyle   Physical activity:    Days per week: Not on file    Minutes per session: Not on file   Stress: Not on file  Relationships   Social connections:    Talks on phone: Not on file    Gets together: Not on file    Attends religious  service: Not on file    Active member of club or organization: Not on file    Attends meetings of clubs or organizations: Not on file    Relationship status: Not on file   Intimate partner violence:    Fear of current or ex partner: Not on file    Emotionally abused: Not on file    Physically abused: Not on file    Forced sexual activity: Not on file  Other Topics Concern   Not on file  Social History Narrative   Not on file    FAMILY HISTORY: No family history on file.  ALLERGIES:  has No Known Allergies.  MEDICATIONS:  Current Outpatient Medications  Medication Sig Dispense Refill   amoxicillin-clavulanate (AUGMENTIN) 875-125 MG tablet Take 1 tablet by mouth 2 (two) times daily. 20 tablet 0   bisacodyl (DULCOLAX) 10 MG suppository Place 1 suppository (10 mg total) rectally daily as  needed for moderate constipation. 12 suppository 0   Diclofenac Sodium 3 % GEL Place 1 application onto the skin 2 (two) times daily. 100 g 0   diphenhydrAMINE (BENADRYL) 25 mg capsule Take 25 mg by mouth every 6 (six) hours as needed for allergies.      HYDROcodone-acetaminophen (NORCO) 7.5-325 MG tablet Take 1 tablet by mouth every 6 (six) hours as needed for moderate pain. 30 tablet 0   levofloxacin (LEVAQUIN) 500 MG tablet Take 1 tablet (500 mg total) by mouth daily. Canceled Zpak prescription 10 tablet 0   lidocaine (LIDODERM) 5 % Place 1 patch onto the skin daily. Remove & Discard patch within 12 hours or as directed by MD 15 patch 0   lidocaine-prilocaine (EMLA) cream Apply 1 application topically as needed. (Patient taking differently: Apply 1 application topically as needed (port access). ) 30 g 0   LORazepam (ATIVAN) 0.5 MG tablet Take 1 tablet (0.5 mg total) by mouth every 6 (six) hours as needed (Nausea or vomiting). 30 tablet 0   methocarbamol (ROBAXIN) 500 MG tablet Take 1 tablet (500 mg total) by mouth every 6 (six) hours as needed for muscle spasms. 30 tablet 0   ondansetron  (ZOFRAN) 8 MG tablet Take 1 tablet (8 mg total) by mouth 2 (two) times daily as needed. Start on the third day after doxorubicin/cytoxan chemotherapy. 30 tablet 2   polyethylene glycol (MIRALAX / GLYCOLAX) packet Take 17 g by mouth daily. 14 each 0   procarbazine (MATULANE) 50 MG capsule Take 4 capsules (200 mg total) by mouth daily. Take daily on days 1-7 of each 21 day cycle. Follow low tyramine diet. 28 capsule 2   prochlorperazine (COMPAZINE) 10 MG tablet Take 1 tablet (10 mg total) by mouth every 6 (six) hours as needed for nausea or vomiting. 30 tablet 2   rivaroxaban (XARELTO) 20 MG TABS tablet Take 1 tablet (20 mg total) by mouth daily with supper. 30 tablet 4   senna-docusate (SENOKOT-S) 8.6-50 MG tablet Take 1 tablet by mouth 2 (two) times daily. 20 tablet 0   No current facility-administered medications for this visit.     REVIEW OF SYSTEMS:    A 10+ POINT REVIEW OF SYSTEMS WAS OBTAINED including neurology, dermatology, psychiatry, cardiac, respiratory, lymph, extremities, GI, GU, Musculoskeletal, constitutional, breasts, reproductive, HEENT.  All pertinent positives are noted in the HPI.  All others are negative.   PHYSICAL EXAMINATION: ECOG PERFORMANCE STATUS: 1 - Symptomatic but completely ambulatory  Vitals:   11/06/18 0841  BP: 113/71  Pulse: 100  Resp: 18  Temp: 97.8 F (36.6 C)  SpO2: 100%   Filed Weights   11/06/18 0841  Weight: 186 lb 11.2 oz (84.7 kg)   .Body mass index is 28.39 kg/m.  GENERAL:alert, in no acute distress and comfortable SKIN: no acute rashes, no significant lesions EYES: conjunctiva are pink and non-injected, sclera anicteric OROPHARYNX: MMM, no exudates, no oropharyngeal erythema or ulceration NECK: supple, no JVD LYMPH:  no palpable lymphadenopathy in the cervical, axillary or inguinal regions LUNGS: clear to auscultation b/l with normal respiratory effort HEART: regular rate & rhythm ABDOMEN:  normoactive bowel sounds , non  tender, not distended. No palpable hepatosplenomegaly.  Extremity: no pedal edema PSYCH: alert & oriented x 3 with fluent speech NEURO: no focal motor/sensory deficits   LABORATORY DATA:  I have reviewed the data as listed  . CBC Latest Ref Rng & Units 11/06/2018 10/23/2018 10/16/2018  WBC 4.0 - 10.5 K/uL 19.4(H) 1.4(L) 14.4(H)  Hemoglobin 13.0 - 17.0 g/dL 9.6(L) 8.3(L) 10.3(L)  Hematocrit 39.0 - 52.0 % 29.1(L) 24.3(L) 31.0(L)  Platelets 150 - 400 K/uL 183 207 182   ANC 1200 . CMP Latest Ref Rng & Units 11/06/2018 10/23/2018 10/16/2018  Glucose 70 - 99 mg/dL 85 110(H) 142(H)  BUN 6 - 20 mg/dL '13 11 13  ' Creatinine 0.61 - 1.24 mg/dL 0.97 0.78 0.86  Sodium 135 - 145 mmol/L 140 140 139  Potassium 3.5 - 5.1 mmol/L 4.0 3.6 4.0  Chloride 98 - 111 mmol/L 104 104 104  CO2 22 - 32 mmol/L '23 26 24  ' Calcium 8.9 - 10.3 mg/dL 9.5 8.6(L) 9.3  Total Protein 6.5 - 8.1 g/dL 7.7 6.8 7.7  Total Bilirubin 0.3 - 1.2 mg/dL 0.4 0.3 0.3  Alkaline Phos 38 - 126 U/L 133(H) 138(H) 182(H)  AST 15 - 41 U/L 15 10(L) 17  ALT 0 - 44 U/L '13 26 24   ' 05/10/18 Biopsy:   05/22/18 BM Bx:    RADIOGRAPHIC STUDIES: I have personally reviewed the radiological images as listed and agreed with the findings in the report. Nm Pet Image Restag (ps) Skull Base To Thigh  Result Date: 10/16/2018 CLINICAL DATA:  Subsequent treatment strategy for Hodgkin's lymphoma. EXAM: NUCLEAR MEDICINE PET SKULL BASE TO THIGH TECHNIQUE: 9.0 mCi F-18 FDG was injected intravenously. Full-ring PET imaging was performed from the skull base to thigh after the radiotracer. CT data was obtained and used for attenuation correction and anatomic localization. Fasting blood glucose: 102 mg/dl COMPARISON:  08/16/2018 PET.  Chest CT 09/04/2018. FINDINGS: Mediastinal blood pool activity: SUV max 1.7 Liver activity: a S.U.V. max of 3.1 NECK: No cervical nodal hypermetabolism. Minimal subcutaneous nodularity superficial to the left side of the mandible measures a  S.U.V. max of 5.5 on image 31/4 and is new. Incidental CT findings: No cervical adenopathy. CHEST: Right paratracheal node measures 1.4 cm and a S.U.V. max of 7.8. Compare 1.6 cm and a S.U.V. max of 10.3 on the prior. Prevascular nodal mass measures 3.4 x 2.7 cm and a S.U.V. max of 4.9 today. Compare 4.7 x 3.7 cm and a S.U.V. max of 6.3 on the prior. Right cardiophrenic angle nodal mass measures 4.8 x 3.3 cm and a S.U.V. max of 11.7. Compare 7.3 x 4.3 cm and a S.U.V. max of 14.1 on the prior. Incidental CT findings: Mild cardiomegaly. Trace left pleural thickening. Right Port-A-Cath tip at mid right atrium. ABDOMEN/PELVIS: No abdominopelvic parenchymal or nodal hypermetabolism. Incidental CT findings: Normal adrenal glands. No abdominopelvic adenopathy. No splenomegaly. SKELETON: Diffuse mild marrow hypermetabolism. Incidental CT findings: none IMPRESSION: 1. Further response to therapy of hypermetabolic thoracic adenopathy. (Deauville 4) 2. Diffuse marrow hypermetabolism likely relates to marrow stimulation by chemotherapy. 3. Mild hypermetabolism and subcutaneous nodularity superficial to the left mandible. Indeterminate. Consider physical exam correlation. Electronically Signed   By: Abigail Miyamoto M.D.   On: 10/16/2018 09:19    ASSESSMENT & PLAN:   23 y.o. male with  1. 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 Leading consideration is lymphoma, possibly Hodgkin's. Given lack of differentiating soft tissue densities, a teratoma is believed less likely. Nonseminomatous germ cell tumor given presence of small effusions might also be within differential though no pulmonary lesions are visualized. Metastatic disease, infection or inflammatory  process are believed less likely.    05/08/18 CT A/P revealed No acute finding or evidence of malignancy in the abdomen.  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 No hypermetabolic lymph nodes within the neck, abdomen or pelvis.  05/22/18 BM Bx was negative   S/p 3 cycles of ABVD  08/16/18 PET/CT revealed Since the prior PET of 05/21/2018, mild disease progression, as evidenced by increased activity and less so size of hypermetabolic thoracic nodes. (Deauville 4) 2. No new or extrathoracic sites of disease. 3. Significant hypermetabolic "brown" fat throughout the neck and chest, mildly degrading evaluation. Discussed the 08/16/18 PET/CT with radiology - confirmed concern for residual Deauville 4 disease   S/p 2 cycles of Escalated BEACOPP. Held C2D8 due to significant neutropenia.  10/16/18 PET/CT revealed Further response to therapy of hypermetabolic thoracic adenopathy. (Deauville 4) 2. Diffuse marrow hypermetabolism likely relates to marrow stimulation by chemotherapy. 3. Mild hypermetabolism and subcutaneous nodularity superficial to the left mandible. Indeterminate. Consider physical exam correlation.  S/p 3 Cycle of Escalated BEACOPP. Held at home Procarbazine after C3D8.  2. B/L great toe subungal hematomas - likely from chemotherapy -podiatry referral given -Patient saw Podiatry, and toe nail was removed -Continue Epsom salt soaks   3. Pulmonary Embolism 09/04/18 CTA Chest revealed Positive for 2 acute subsegmental pulmonary emboli in the right upper lobe. 2. Heterogeneous aeration diffusely, likely small airways disease with air trapping given there is also generalized bronchial wall thickening.  3. 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 Tumor as risk factor for PE  4) Severe Neutropenia - no fever.  PLAN: -Discussed pt labwork today, 11/06/18; WBC at 19.4k in setting of G-CSF support, HGB improved to 9.6 -Proceed with PET/CT on 12/04/18  -Possible joint inflammation at sternocleidomastoid joint, which has been resolving, but will be mindful of this when evaluating the 12/04/18 PET/CT -Advised infection prevention precautions, frequent handwashing and wearing a mask when leaving the house -Given patient's somewhat resistant disease, which was initially bulky, I recommended that he discuss considerations for RT further with Rad Onc and am providing a referral -Recommend salt and baking soda mouthwashes 4-5 times a day -Will continue 96m Xarelto for at least 6 months total, and in the absence of malignancy or other provoking risk factors -Discussed that the pt should avoid elective procedures while on blood thinners -Continue Senna S and Miralax -Will refill Norco, and Robaxin only as needed -Pt will avoid NSAIDs -Continue Vitamin B complex -Will see the pt back in 4-5 weeks   F/u as currently scheduled for PET/CT on 5/13 and then lab and MD visit on 5/20.   All of the patients questions were answered with apparent satisfaction. The patient knows to call the clinic with any problems, questions or concerns.  The total time spent in the appt was 20 minutes and more than 50% was on counseling and direct patient cares.    GSullivan LoneMD MS AAHIVMS SG.V. (Sonny) Montgomery Va Medical CenterCSaint Francis Hospital BartlettHematology/Oncology Physician CBaylor [facility] (Office):       [phone](Work cell):  [phone](Fax):           [phone] 11/06/2018 9:40 AM   I, SBaldwin Jamaica am acting as a sEducation administratorfor  Dr. Sullivan Lone.   .I have reviewed the above documentation for accuracy and completeness, and I agree with the above. [name] MD

## 2018-11-06 ENCOUNTER — Inpatient Hospital Stay: Payer: BC Managed Care – PPO

## 2018-11-06 ENCOUNTER — Telehealth: Payer: Self-pay | Admitting: Hematology

## 2018-11-06 ENCOUNTER — Other Ambulatory Visit: Payer: Self-pay

## 2018-11-06 ENCOUNTER — Ambulatory Visit: Payer: BLUE CROSS/BLUE SHIELD | Admitting: Hematology

## 2018-11-06 ENCOUNTER — Inpatient Hospital Stay (HOSPITAL_BASED_OUTPATIENT_CLINIC_OR_DEPARTMENT_OTHER): Payer: BC Managed Care – PPO | Admitting: Hematology

## 2018-11-06 ENCOUNTER — Ambulatory Visit: Payer: BLUE CROSS/BLUE SHIELD

## 2018-11-06 VITALS — BP 113/71 | HR 100 | Temp 97.8°F | Resp 18 | Ht 68.0 in | Wt 186.7 lb

## 2018-11-06 DIAGNOSIS — Z5111 Encounter for antineoplastic chemotherapy: Secondary | ICD-10-CM

## 2018-11-06 DIAGNOSIS — C8118 Nodular sclerosis classical Hodgkin lymphoma, lymph nodes of multiple sites: Secondary | ICD-10-CM | POA: Diagnosis not present

## 2018-11-06 DIAGNOSIS — I2699 Other pulmonary embolism without acute cor pulmonale: Secondary | ICD-10-CM | POA: Diagnosis not present

## 2018-11-06 LAB — CBC WITH DIFFERENTIAL/PLATELET
Abs Immature Granulocytes: 1.19 10*3/uL — ABNORMAL HIGH (ref 0.00–0.07)
Basophils Absolute: 0.1 10*3/uL (ref 0.0–0.1)
Basophils Relative: 0 %
Eosinophils Absolute: 0 10*3/uL (ref 0.0–0.5)
Eosinophils Relative: 0 %
HCT: 29.1 % — ABNORMAL LOW (ref 39.0–52.0)
Hemoglobin: 9.6 g/dL — ABNORMAL LOW (ref 13.0–17.0)
Immature Granulocytes: 6 %
Lymphocytes Relative: 6 %
Lymphs Abs: 1.2 10*3/uL (ref 0.7–4.0)
MCH: 27.7 pg (ref 26.0–34.0)
MCHC: 33 g/dL (ref 30.0–36.0)
MCV: 84.1 fL (ref 80.0–100.0)
Monocytes Absolute: 1.2 10*3/uL — ABNORMAL HIGH (ref 0.1–1.0)
Monocytes Relative: 6 %
Neutro Abs: 15.7 10*3/uL — ABNORMAL HIGH (ref 1.7–7.7)
Neutrophils Relative %: 82 %
Platelets: 183 10*3/uL (ref 150–400)
RBC: 3.46 MIL/uL — ABNORMAL LOW (ref 4.22–5.81)
RDW: 22.3 % — ABNORMAL HIGH (ref 11.5–15.5)
WBC: 19.4 10*3/uL — ABNORMAL HIGH (ref 4.0–10.5)
nRBC: 2.4 % — ABNORMAL HIGH (ref 0.0–0.2)

## 2018-11-06 LAB — CMP (CANCER CENTER ONLY)
ALT: 13 U/L (ref 0–44)
AST: 15 U/L (ref 15–41)
Albumin: 3.8 g/dL (ref 3.5–5.0)
Alkaline Phosphatase: 133 U/L — ABNORMAL HIGH (ref 38–126)
Anion gap: 13 (ref 5–15)
BUN: 13 mg/dL (ref 6–20)
CO2: 23 mmol/L (ref 22–32)
Calcium: 9.5 mg/dL (ref 8.9–10.3)
Chloride: 104 mmol/L (ref 98–111)
Creatinine: 0.97 mg/dL (ref 0.61–1.24)
GFR, Est AFR Am: >60 mL/min (ref >60)
GFR, Estimated: >60 mL/min (ref >60)
Glucose, Bld: 85 mg/dL (ref 70–99)
Potassium: 4 mmol/L (ref 3.5–5.1)
Sodium: 140 mmol/L (ref 135–145)
Total Bilirubin: 0.4 mg/dL (ref 0.3–1.2)
Total Protein: 7.7 g/dL (ref 6.5–8.1)

## 2018-11-06 NOTE — Telephone Encounter (Signed)
Per 4/15 los , appts already scheduled.

## 2018-11-07 ENCOUNTER — Ambulatory Visit: Payer: BLUE CROSS/BLUE SHIELD

## 2018-11-08 ENCOUNTER — Ambulatory Visit: Payer: BLUE CROSS/BLUE SHIELD

## 2018-11-13 ENCOUNTER — Ambulatory Visit: Payer: BLUE CROSS/BLUE SHIELD

## 2018-11-15 ENCOUNTER — Ambulatory Visit: Payer: BLUE CROSS/BLUE SHIELD

## 2018-11-27 ENCOUNTER — Emergency Department (HOSPITAL_COMMUNITY)
Admission: EM | Admit: 2018-11-27 | Discharge: 2018-11-27 | Disposition: A | Discharge status: Home / Self Care | Payer: BC Managed Care – PPO | Attending: Emergency Medicine | Admitting: Emergency Medicine

## 2018-11-27 ENCOUNTER — Encounter (HOSPITAL_COMMUNITY): Payer: Self-pay | Admitting: Emergency Medicine

## 2018-11-27 ENCOUNTER — Telehealth: Payer: Self-pay | Admitting: *Deleted

## 2018-11-27 ENCOUNTER — Emergency Department (HOSPITAL_COMMUNITY): Payer: BC Managed Care – PPO

## 2018-11-27 DIAGNOSIS — Z7901 Long term (current) use of anticoagulants: Secondary | ICD-10-CM | POA: Diagnosis not present

## 2018-11-27 DIAGNOSIS — J189 Pneumonia, unspecified organism: Secondary | ICD-10-CM | POA: Insufficient documentation

## 2018-11-27 DIAGNOSIS — R05 Cough: Secondary | ICD-10-CM | POA: Diagnosis not present

## 2018-11-27 DIAGNOSIS — J181 Lobar pneumonia, unspecified organism: Principal | ICD-10-CM

## 2018-11-27 DIAGNOSIS — Z8572 Personal history of non-Hodgkin lymphomas: Secondary | ICD-10-CM | POA: Insufficient documentation

## 2018-11-27 DIAGNOSIS — Z86711 Personal history of pulmonary embolism: Secondary | ICD-10-CM | POA: Insufficient documentation

## 2018-11-27 DIAGNOSIS — Z20828 Contact with and (suspected) exposure to other viral communicable diseases: Secondary | ICD-10-CM | POA: Insufficient documentation

## 2018-11-27 DIAGNOSIS — R062 Wheezing: Secondary | ICD-10-CM | POA: Diagnosis present

## 2018-11-27 MED ORDER — DOXYCYCLINE HYCLATE 100 MG PO CAPS: 100.0000 mg | ORAL_CAPSULE | Freq: Two times a day (BID) | ORAL | 0 refills | Status: AC

## 2018-11-27 MED ORDER — ALBUTEROL SULFATE HFA 108 (90 BASE) MCG/ACT IN AERS
1.0000 | INHALATION_SPRAY | Freq: Once | RESPIRATORY_TRACT | Status: AC
  Administered 2018-11-27: 2 via RESPIRATORY_TRACT
  Filled 2018-11-27: qty 6.7

## 2018-11-27 NOTE — Telephone Encounter (Signed)
Called with 2-3 day symptoms of wheezing, cough, and maybe a little short of breath. Denies increase in temperature or other symptoms. Information given to Dr.Kale. Per Dr. Irene Limbo, advised patient to go to [facility] for evaluation, or PCP. Patient verbalized understanding;states he does not have PCP in area yet, will go to Urgent Care.

## 2018-11-27 NOTE — Discharge Instructions (Signed)
You have been diagnosed today with community-acquired pneumonia.  At this time there does not appear to be the presence of an emergent medical condition, however there is always the potential for conditions to change. Please read and follow the below instructions.  Please return to the Emergency Department immediately for any new or worsening symptoms or if your symptoms do not improve within 3 days. Please be sure to follow up with your Primary Care Provider within one week regarding your visit today; please call their office to schedule an appointment even if you are feeling better for a follow-up visit. Please take the antibiotic doxycycline as prescribed to treat your community-acquired pneumonia.  Take the medication twice daily.  Call your primary care provider and/or oncologist tomorrow morning to inform them of your diagnoses, new medication and to schedule follow-up this week for reevaluation.\ Additionally you have been tested for the novel coronavirus, COVID-19, today.  Your results will be available within the next 2-3 days on your MyChart account.  Please self quarantine for the next 2 weeks to avoid potential spread.  Follow-up with your primary care provider via telephone visit tomorrow for further discussion.  Return to the emergency department for any new or worsening symptoms including shortness of breath. You may use the albuterol inhaler given you today to help with your intermittent wheezing.  If you feel that you are using his medication too often or that it is not helping then return to the emergency department immediately for reevaluation.  Get help right away if: You are short of breath. You have chest pain. Your sickness gets worse. This is very serious if: You are an older adult. Your body's defense system is weak. You cough up blood. You have high fever Any new/concerning or worsening symptoms  Please read the additional information packets attached to your discharge  summary.  Do not take your medicine if  develop an itchy rash, swelling in your mouth or lips, or difficulty breathing.  =======  If you live with, or provide care at home for, a person confirmed to have, or being evaluated for, COVID-19 infection please follow these guidelines to prevent infection:  Follow healthcare providers instructions Make sure that you understand and can help the patient follow any healthcare provider instructions for all care.  Provide for the patients basic needs You should help the patient with basic needs in the home and provide support for getting groceries, prescriptions, and other personal needs.  Monitor the patients symptoms If they are getting sicker, call his or her medical provider a  This will help the healthcare providers office take steps to keep other people from getting infected. Ask the healthcare provider to call the local or state health department.  Limit the number of people who have contact with the patient If possible, have only one caregiver for the patient. Other household members should stay in another home or place of residence. If this is not possible, they should stay in another room, or be separated from the patient as much as possible. Use a separate bathroom, if available. Restrict visitors who do not have an essential need to be in the home.  Keep older adults, very young children, and other sick people away from the patient Keep older adults, very young children, and those who have compromised immune systems or chronic health conditions away from the patient. This includes people with chronic heart, lung, or kidney conditions, diabetes, and cancer.  Ensure good ventilation Make sure that shared spaces  in the home have good air flow, such as from an air conditioner or an opened window, weather permitting.  Wash your hands often Wash your hands often and thoroughly with soap and water for at least 20 seconds. You can use  an alcohol based hand sanitizer if soap and water are not available and if your hands are not visibly dirty. Avoid touching your eyes, nose, and mouth with unwashed hands. Use disposable paper towels to dry your hands. If not available, use dedicated cloth towels and replace them when they become wet.  Wear a facemask and gloves Wear a disposable facemask at all times in the room and gloves when you touch or have contact with the patients blood, body fluids, and/or secretions or excretions, such as sweat, saliva, sputum, nasal mucus, vomit, urine, or feces.  Ensure the mask fits over your nose and mouth tightly, and do not touch it during use. Throw out disposable facemasks and gloves after using them. Do not reuse. Wash your hands immediately after removing your facemask and gloves. If your personal clothing becomes contaminated, carefully remove clothing and launder. Wash your hands after handling contaminated clothing. Place all used disposable facemasks, gloves, and other waste in a lined container before disposing them with other household waste. Remove gloves and wash your hands immediately after handling these items.  Do not share dishes, glasses, or other household items with the patient Avoid sharing household items. You should not share dishes, drinking glasses, cups, eating utensils, towels, bedding, or other items After the person uses these items, you should wash them thoroughly with soap and water.  Wash laundry thoroughly Immediately remove and wash clothes or bedding that have blood, body fluids, and/or secretions or excretions, such as sweat, saliva, sputum, nasal mucus, vomit, urine, or feces, on them. Wear gloves when handling laundry from the patient. Read and follow directions on labels of laundry or clothing items and detergent. In general, wash and dry with the warmest temperatures recommended on the label.  Clean all areas the individual has used often Clean all  touchable surfaces, such as counters, tabletops, doorknobs, bathroom fixtures, toilets, phones, keyboards, tablets, and bedside tables, every day. Also, clean any surfaces that may have blood, body fluids, and/or secretions or excretions on them. Wear gloves when cleaning surfaces the patient has come in contact with. Use a diluted bleach solution (e.g., dilute bleach with 1 part bleach and 10 parts water) or a household disinfectant with a label that says EPA-registered for coronaviruses. To make a bleach solution at home, add 1 tablespoon of bleach to 1 quart (4 cups) of water. For a larger supply, add  cup of bleach to 1 gallon (16 cups) of water. Read labels of cleaning products and follow recommendations provided on product labels. Labels contain instructions for safe and effective use of the cleaning product including precautions you should take when applying the product, such as wearing gloves or eye protection and making sure you have good ventilation during use of the product. Remove gloves and wash hands immediately after cleaning.  Monitor yourself for signs and symptoms of illness Caregivers and household members are considered close contacts, should monitor their health, and will be asked to limit movement outside of the home to the extent possible. Follow the monitoring steps for close contacts listed on the symptom monitoring form.   ? If you have additional questions, contact your local health department or call the epidemiologist on call at [phone] (available 24/7). ? This guidance is  subject to change. For the most up-to-date guidance from [facility], please refer to their website: [email or web address]

## 2018-11-27 NOTE — ED Triage Notes (Signed)
Pt in with c/o cough and wheezing x a few days. States he recently finished last chemo trx for non-hodgkins lymphoma 1 mo ago. C/o some throat itching and allergy symptoms as well. Denies actual sob, just feels "more wheezes" over the past few days. Low grade temp 99.3

## 2018-11-27 NOTE — ED Provider Notes (Addendum)
[facility] EMERGENCY DEPARTMENT Provider Note   CSN: [id number] Arrival date & time: 11/27/18  1750    History   Chief Complaint Chief Complaint  Patient presents with   Cough   Wheezing    HPI Jayvier Burgher is a 23 y.o. male with history of non-Hodgkin's lymphoma last completed therapy 1 month ago presenting today for concern of wheezing that only occurs at night.  Patient reports that over the past 1-2 days he has noticed in the intermittent wheezing accompanied with a cough that occurs briefly prior to self resolving.  He describes a mild nonproductive cough and a wheezing feeling without shortness of breath.  Patient believes that this is due to his allergies and has been taking Claritin without relief x1 day.  Patient denies fever/chills, chest pain/shortness of breath, extremity pain/swelling or color change, hemoptysis/sputum production, abdominal pain, nausea/vomiting, diarrhea, rash, difficulty swallowing, sore throat, body aches, fatigue or any additional concerns.  Patient is requesting testing for COVID-19 today.  Of note patient reports that he is currently on Xarelto and has not missed any dosages.     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   Rupture of radial collateral ligament of left thumb 12/30/2014   Left ankle pain 11/16/2014   Closed fracture of shaft of left radius and ulna 10/31/2014   Dislocation of carpometacarpal joint of left thumb 10/31/2014    Past Surgical History:  Procedure Laterality Date   ANKLE SURGERY Left    cleaned up    FRACTURE SURGERY Left    arm   IR IMAGING GUIDED PORT INSERTION  06/03/2018   MEDIASTINOTOMY CHAMBERLAIN MCNEIL N/A 05/10/2018   Procedure: Mediastinal mass biopsy ;  Surgeon: Grace Isaac, MD;  Location: [place];  Service: Thoracic;  Laterality: N/A;   ORIF FINGER / THUMB FRACTURE Left         Home Medications    Prior to Admission medications   Medication Sig Start Date End Date Taking? Authorizing Provider  bisacodyl (DULCOLAX) 10 MG suppository Place 1 suppository (10 mg total) rectally daily as needed for moderate constipation. 09/20/18   Aline August, MD  Diclofenac Sodium 3 % GEL Place 1 application onto the skin 2 (two) times daily. 09/25/18   [name], MD  diphenhydrAMINE (BENADRYL) 25 mg capsule Take 25 mg by mouth every 6 (six) hours as needed for allergies.     [provider]  doxycycline (VIBRAMYCIN) 100 MG capsule Take 1 capsule (100 mg total) by mouth 2 (two) times daily for 7 days. 11/27/18 12/04/18  Deliah Boston, PA-C  HYDROcodone-acetaminophen (NORCO) 7.5-325 MG tablet Take 1 tablet by mouth every 6 (six) hours as needed for moderate pain. 09/25/18   [name], MD  lidocaine (LIDODERM) 5 % Place 1 patch onto the skin daily. Remove & Discard patch within 12 hours or as directed by MD 09/25/18   [name], MD  lidocaine-prilocaine (EMLA) cream Apply 1 application topically as needed. Patient taking differently: Apply 1 application topically as needed (port access).  06/04/18  [name], MD  LORazepam (ATIVAN) 0.5 MG tablet Take 1 tablet (0.5 mg total) by mouth every 6 (six) hours as needed (Nausea or vomiting). 08/30/18   [name], MD  methocarbamol (ROBAXIN) 500 MG tablet Take 1 tablet (500 mg total) by mouth every 6 (six) hours as needed for muscle spasms. 09/20/18   Aline August, MD  ondansetron (ZOFRAN) 8 MG tablet Take 1 tablet (8 mg total) by mouth 2 (two) times daily as needed.  Start on the third day after doxorubicin/cytoxan chemotherapy. 09/25/18   [name], MD  polyethylene glycol [facility] / Floria Raveling) packet Take 17 g by mouth daily. 09/21/18   Aline August, MD  procarbazine (MATULANE) 50 MG capsule Take 4 capsules (200 mg total) by mouth daily. Take daily on days 1-7 of each 21 day cycle. Follow low tyramine diet. 09/04/18   [name], MD  prochlorperazine (COMPAZINE) 10 MG tablet Take 1 tablet (10 mg total) by mouth every 6 (six) hours as needed for nausea or vomiting. 09/25/18   [name], MD  rivaroxaban (XARELTO) 20 MG TABS tablet Take 1 tablet (20 mg total) by mouth daily with supper. 10/02/18   [name], MD  senna-docusate (SENOKOT-S) 8.6-50 MG tablet Take 1 tablet by mouth 2 (two) times daily. 09/20/18   Aline August, MD    Family History No family history on file.  Social History Social History   Tobacco Use   Smoking status: Never Smoker   Smokeless tobacco: Never Used  Substance Use Topics   Alcohol use: Never    Frequency: Never   Drug use: Never     Allergies   Patient has no known allergies.   Review of Systems Review of Systems  Constitutional: Negative for chills, fatigue and fever.  Respiratory: Positive for cough (Mild nonproductive only at night x2 nights) and wheezing (Lasts few moments only at night x2 nights). Negative for shortness of breath.   Cardiovascular: Negative.  Negative for chest pain, palpitations and leg swelling.  Gastrointestinal: Negative.  Negative for abdominal pain, diarrhea, nausea and vomiting.  Musculoskeletal: Negative.  Negative for arthralgias, myalgias and neck stiffness.  Skin: Negative.  Negative for rash.  Neurological: Negative.  Negative for headaches.  All other systems reviewed and are negative.  Physical Exam Updated Vital Signs BP 123/81 (BP Location: Right Arm)    Pulse 97    Temp 98.7 F (37.1 C) (Oral)    Resp 16    Wt 84.6 kg    SpO2 100%     BMI 28.36 kg/m   Physical Exam Constitutional:      General: He is not in acute distress.    Appearance: Normal appearance. He is well-developed. He is not ill-appearing or diaphoretic.  HENT:     Head: Normocephalic and atraumatic.     Right Ear: External ear normal.     Left Ear: External ear normal.     Nose: Nose normal.     Mouth/Throat:     Mouth: Mucous membranes are moist.     Pharynx: Oropharynx is clear.  Eyes:     General: Vision grossly intact. Gaze aligned appropriately. No scleral icterus.    Extraocular Movements: Extraocular movements intact.     Conjunctiva/sclera: Conjunctivae normal.     Pupils: Pupils are equal, round, and reactive to light.  Neck:     Musculoskeletal: Normal range of motion.     Trachea: Trachea and phonation normal. No tracheal deviation.  Cardiovascular:     Rate and Rhythm: Normal rate and regular rhythm.     Pulses: Normal pulses.     Heart sounds: Normal heart sounds.  Pulmonary:     Effort: Pulmonary effort is normal. No respiratory distress.     Breath sounds: Normal breath sounds. No wheezing or rhonchi.  Chest:     Chest wall: No deformity, tenderness or crepitus.  Abdominal:     General: There is no distension.     Palpations: Abdomen is soft.     Tenderness: There is no abdominal tenderness. There is no guarding or rebound.  Genitourinary:    Comments: Examination deferred by patient Musculoskeletal: Normal range of motion.        General: No tenderness.     Right lower leg: No edema.     Left lower leg: No edema.     Comments: Patient ambulating around the room without difficulty/distress.  Skin:    General: Skin is warm and dry.  Neurological:     Mental Status: He is alert.     GCS: GCS eye subscore is 4. GCS verbal subscore is 5. GCS motor subscore is 6.     Comments: Speech is clear and goal oriented, follows commands Major Cranial nerves without deficit, no facial droop Moves extremities without ataxia,  coordination intact Normal gait  Psychiatric:        Mood and Affect: Mood normal.        Behavior: Behavior normal.      ED Treatments / Results  Labs (all labs ordered are listed, but only abnormal results are displayed) Labs Reviewed  NOVEL CORONAVIRUS, NAA (HOSPITAL ORDER, SEND-OUT TO REF LAB)    EKG None  Radiology Dg Chest Portable 1 View  Result Date: 11/27/2018 CLINICAL DATA:  Wheezing EXAM: PORTABLE CHEST 1 VIEW COMPARISON:  Chest x-ray dated 09/28/2018 FINDINGS: The right-sided Port-A-Cath as well position. No pneumothorax. No large pleural effusion. There is a new opacity at the right lung base. The cardiomediastinal silhouette is unremarkable. IMPRESSION: 1. New airspace opacity at the right lung base concerning for developing pneumonia. A 4-6 week follow-up two-view chest x-ray is recommended to confirm resolution of this finding. 2. Well-positioned right-sided Port-A-Cath. Electronically Signed   By: Constance Holster M.D.   On: 11/27/2018 20:08    Procedures Procedures (including critical care time)  Medications Ordered in ED Medications  albuterol (VENTOLIN HFA) 108 (90 Base) MCG/ACT inhaler 1-2 puff (2 puffs Inhalation Given 11/27/18 2021)     Initial Impression / Assessment and Plan / ED Course  I have reviewed the triage vital signs and the nursing notes.  Pertinent labs & imaging results that were available during my care of the patient were reviewed by me and considered in my medical decision making (see chart for details).    23 year old male with history of non-Hodgkin's lymphoma presents today for wheezing and nonproductive cough that only occurs briefly at night that began yesterday.  He denies any chest pain, shortness of breath, fever/chills or additional symptoms.  Initial evaluation reveals a very well-appearing 23 year old male in no acute distress.  Airway patent without stridor or signs of deep space infection.  Heart regular rate and rhythm  without tachycardia or murmur.  Lungs clear to auscultation bilaterally without wheezing or rhonchi.  Patient's work of breathing is normal he has no tachypnea and SPO2 is 100% on room air.  Abdomen soft nontender without distention or peritoneal signs.  No signs of rash or anaphylaxis.  Question whether patient with allergic versus viral cough at this time will order chest x-ray for evaluation of pneumonia.  Will provide patient with albuterol inhaler for his intermittent wheezing.  Patient reports breathing at baseline at this time states wheezing is not accompanied by shortness of breath and resolved prior to arrival.  As patient is without history of chest pain or shortness of breath and states compliance with Xarelto doubt pulmonary embolism at this time additionally doubt ACS, dissection or other acute cardiopulmonary etiologies of patient's symptoms. - Chest x-ray:  IMPRESSION:  1. New airspace opacity at the right lung base concerning for  developing pneumonia. A 4-6 week follow-up two-view chest x-ray is  recommended to confirm resolution of this finding.  2. Well-positioned right-sided Port-A-Cath.  - Patient's case discussed with and imaging reviewed with Dr. Ralene Bathe; our plan of care at this time is to provide patient with antibiotic for his community-acquired pneumonia, outpatient COVID-19 test and discharge with follow-up with PCP/oncology.  No indication for admission or further work-up at this time. - Patient reevaluated is resting comfortably walking around the room and in no acute distress.  I have updated him on chest x-ray results and plan of care at this time and he is agreeable.  COVID-19 test collected by RN.  Doxycycline prescribed for community-acquired pneumonia.  Patient is afebrile, not tachycardic, not hypotensive, not tachypneic and with SPO2 of 100% on room air.  Patient does not meet SIRS or sepsis criteria.  At this time there does not appear to be any evidence of an acute  emergency medical condition and the patient appears stable for discharge with appropriate outpatient follow up. Diagnosis was discussed with patient who verbalizes understanding of care plan and is agreeable to discharge. I have discussed return precautions with patient who verbalizes understanding of return precautions. Patient encouraged to follow-up with their PCP. All questions answered.  Patient has been discharged in good condition.  Patient's case discussed with Dr. Ralene Bathe who agrees with plan to discharge with follow-up.   Patient was evaluated in the context of the global COVID-19 pandemic, which necessitated consideration that the patient might be at risk for infection with the SARS-CoV-2 virus that causes COVID-19. Institutional protocols and algorithms that pertain to the evaluation of patients at risk for COVID-19 are in a state of rapid change based on information released by regulatory bodies including the CDC and federal and state organizations. These policies and algorithms were followed during the patient's care in the ED.    Note: Portions of this report may have been transcribed using voice recognition software. Every effort was made to ensure accuracy; however, inadvertent computerized transcription errors may still be present. Final Clinical Impressions(s) / ED Diagnoses   Final diagnoses:  Community acquired pneumonia of right lower lobe of lung [facility])    ED Discharge Orders         Ordered    doxycycline (VIBRAMYCIN) 100 MG capsule  2 times daily     11/27/18 2121           Gari Crown 11/27/18 2139    Deliah Boston, PA-C 11/27/18 2146    Quintella Reichert, MD 11/29/18 [phone]

## 2018-11-29 LAB — NOVEL CORONAVIRUS, NAA (HOSP ORDER, SEND-OUT TO REF LAB; TAT 18-24 HRS): SARS-CoV-2, NAA: NOT DETECTED

## 2018-12-03 NOTE — Progress Notes (Signed)
Lymphoma Location(s) / Histology:  05/11/19 Diagnosis 1. Lymph node, biopsy, scalene - CLASSICAL HODGKIN LYMPHOMA, NODULAR SCLEROSIS TYPE. 2. Lymph node, biopsy, scalene - CLASSICAL HODGKIN LYMPHOMA, NODULAR SCLEROSIS TYPE.  David Lam presented on 05/07/18 to the ED with symptoms of: chest pain which had developed over the previous 2 days.   Biopsies of scalene lymph node revealed: classical hodgkin lymphoma, nodular sclerosis type.   Past/Anticipated interventions by medical oncology, if any:  11/06/18 Dr. Irene Limbo -S/p 3 cycles of ABVD 08/16/18 PET/CT revealed Since the prior PET of 05/21/2018, mild disease progression, as evidenced by increased activity and less so size of hypermetabolic thoracic nodes. (Deau[place] 4) 2. No new or extrathoracic sites of disease. 3. Significant hypermetabolic "brown" fat throughout the neck and chest, mildly degrading evaluation. Discussed the 08/16/18 PET/CT with radiology - confirmed concern for residual Deauville 4 disease  -S/p 2 cycles of Escalated BEACOPP. Held C2D8 due to significant neutropenia. 10/16/18 PET/CT revealed Further response to therapy of hypermetabolic thoracic adenopathy. ([place]auville 4) 2. Diffuse marrow hypermetabolism likely relates to marrow stimulation by chemotherapy. 3. Mild hypermetabolism and subcutaneous nodularity superficial to the left mandible. Indeterminate. Consider physical exam correlation. -S/p 3 Cycle of Escalated BEACOPP. Held at home Procarbazine after C3D8.  PLAN: -Discussed pt labwork today, 11/06/18; WBC at 19.4k in setting of G-CSF support, HGB improved to 9.6 -Proceed with PET/CT on 12/04/18  -Possible joint inflammation at sternocleidomastoid joint, which has been resolving, but will be mindful of this when evaluating the 12/04/18 PET/CT -Advised infection prevention precautions, frequent handwashing and wearing a mask when leaving the house -Given patient's somewhat resistant disease, which was initially  bulky, I recommended that he discuss considerations for RT further with Rad Onc and am providing a referral -Recommend salt and baking soda mouthwashes 4-5 times a day -Will continue 20mg  Xarelto for at least 6 months total, and in the absence of malignancy or other provoking risk factors -Discussed that the pt should avoid elective procedures while on blood thinners -Continue Senna S and Miralax -Will refill Norco, and Robaxin only as needed -Pt will avoid NSAIDs -Continue Vitamin B complex -Will see the pt back in 4-5 weeks  F/u as currently scheduled for PET/CT on 5/13 and then lab and MD visit on 5/20.   Weight changes, if any, over the past 6 months: He denies.   Recurrent fevers, or drenching night sweats, if any: He denies.   SAFETY ISSUES:  Prior radiation? No  Pacemaker/ICD? No  Possible current pregnancy? No  Is the patient on methotrexate? No  Current Complaints / other details:

## 2018-12-04 ENCOUNTER — Encounter (HOSPITAL_COMMUNITY)
Admission: RE | Admit: 2018-12-04 | Discharge: 2018-12-04 | Disposition: A | Discharge status: Home / Self Care | Payer: BLUE CROSS/BLUE SHIELD | Source: Ambulatory Visit | Attending: Hematology | Admitting: Hematology

## 2018-12-04 ENCOUNTER — Other Ambulatory Visit: Payer: Self-pay

## 2018-12-04 DIAGNOSIS — C8118 Nodular sclerosis classical Hodgkin lymphoma, lymph nodes of multiple sites: Secondary | ICD-10-CM | POA: Insufficient documentation

## 2018-12-04 DIAGNOSIS — Z5111 Encounter for antineoplastic chemotherapy: Secondary | ICD-10-CM | POA: Diagnosis present

## 2018-12-04 LAB — GLUCOSE, CAPILLARY: Glucose-Capillary: 90 mg/dL (ref 70–99)

## 2018-12-04 MED ORDER — FLUDEOXYGLUCOSE F - 18 (FDG) INJECTION
9.5000 | Freq: Once | INTRAVENOUS | Status: AC
  Administered 2018-12-04: 9.5 via INTRAVENOUS

## 2018-12-10 ENCOUNTER — Ambulatory Visit
Admission: RE | Admit: 2018-12-10 | Discharge: 2018-12-10 | Disposition: A | Discharge status: Home / Self Care | Payer: BLUE CROSS/BLUE SHIELD | Source: Ambulatory Visit | Attending: Radiation Oncology | Admitting: Radiation Oncology

## 2018-12-10 ENCOUNTER — Other Ambulatory Visit: Payer: Self-pay

## 2018-12-10 ENCOUNTER — Encounter: Payer: Self-pay | Admitting: Radiation Oncology

## 2018-12-10 DIAGNOSIS — C8118 Nodular sclerosis classical Hodgkin lymphoma, lymph nodes of multiple sites: Secondary | ICD-10-CM

## 2018-12-10 DIAGNOSIS — C8112 Nodular sclerosis classical Hodgkin lymphoma, intrathoracic lymph nodes: Secondary | ICD-10-CM

## 2018-12-10 NOTE — Progress Notes (Signed)
Radiation Oncology         [phone] ________________________________  Initial WebEx Consultation  Name: [name] MRN: [id number]  Date: 12/10/2018  DOB: 10-08-1995  GO:VPCHEKB, No Pcp Per  [name], MD   REFERRING PHYSICIAN: [name], MD  DIAGNOSIS:    ICD-10-CM   1. 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 male with no known prior medical problems who presented to the emergency department on 05/07/2018 with chest pain for two days. He was evaluated with a chest x-ray which identified mediastinal and right hilar adenopathy. Further evaluation with CT angiogram of the chest showed bulky, bilateral anterior superior mediastinal confluent soft tissue masses which were not calcified in appearance and did not appear to cause occlusion or significant luminal narrowing of traversing vasculature. Leading consideration was lymphoma. CT scan of the abdomen/pelvis on 05/08/2018 did not show any acute finding or evidence of malignancy in the abdomen.  Biopsy of two "scalene" lymph nodes on 05/10/2018 revealed classical Hodgkin lymphoma, nodular sclerosis type.  Initial PET scan from 05/21/2018 showed hypermetabolic mediastinal adenopathy, compatible with history of lymphoma. No hypermetabolic lymph nodes within the neck, abdomen, or pelvis.  The patient also underwent CT-guided bone marrow biopsy on 05/22/2018 which was negative for disease.   He began chemotherapy with ABVD on 06/05/2018 and completed three cycles. Repeat PET/CT on 08/16/2018 showed mild disease progression, as evidenced by increased activity and less size of hypermetabolic thoracic nodes (Deauville 4); no new or extrathoracic sites of disease. His chemotehrapy treatment was then switched to  escalated BEACOPP, started 09/04/2018. He completed two cycles then repeated PET/CT scan on 10/16/2018. This showed further response to therapy of hypermetabolic thoracic adenopathy (Deauville 4). Diffuse marrow hypermetabolism likely related to marrow stimulation by chemotherapy. He then proceeded to cycle 3 of escalated BEACOPP, completed 10/23/2018.  Most recent re-staging PET/CT on 12/04/2018 shows previously identified thoracic index lesions are stable to minimally progressed in size without substantial increase in hypermetabolism. Features remain compatible with Deauville 4. A right internal mammary node shows only minimal interval increase in size but more prominent increase in hypermetabolism, from Deauville 3 previously to now Deauville 4. No evidence for hypermetabolic disease in the abdomen or pelvis. There is a new 18 mm ground-glass opacity in the anterior right lower lobe, likely infectious/inflammatory.  The patient has been referred today for discussion of potential radiation treatment options to help manage his somewhat-resistant disease.  I personally reviewed the patient's pre and post tx images with him today by WebEx.  He was a Education officer, museum at [employer], played football, degree in Sports Management.  PREVIOUS RADIATION THERAPY: No  PAST MEDICAL HISTORY:  has a past medical history of Non Hodgkin's lymphoma ([place]) and Non Hodgkin's lymphoma ([place]).    PAST SURGICAL HISTORY: Past Surgical History:  Procedure Laterality Date  . ANKLE SURGERY Left    cleaned up   . FRACTURE SURGERY Left    arm  . IR IMAGING GUIDED PORT INSERTION  06/03/2018  . MEDIASTINOTOMY CHAMBERLAIN MCNEIL N/A 05/10/2018   Procedure: Mediastinal mass biopsy ;  Surgeon: Grace Isaac, MD;  Location: [facility] OR;  Service: Thoracic;  Laterality: N/A;  . ORIF FINGER / THUMB FRACTURE Left     FAMILY HISTORY: family history is not on file.  SOCIAL HISTORY:  reports that he has never smoked. He has never used  smokeless tobacco. He reports that he does not drink alcohol or use drugs.  ALLERGIES: Patient has no known allergies.  MEDICATIONS:  Current Outpatient Medications  Medication Sig Dispense Refill  . Diclofenac Sodium 3 % GEL Place 1 application onto the skin 2 (two) times daily. 100 g 0  . diphenhydrAMINE (BENADRYL) 25 mg capsule Take 25 mg by mouth every 6 (six) hours as needed for allergies.     Marland Kitchen HYDROcodone-acetaminophen (NORCO) 7.5-325 MG tablet Take 1 tablet by mouth every 6 (six) hours as needed for moderate pain. 30 tablet 0  . lidocaine-prilocaine (EMLA) cream Apply 1 application topically as needed. (Patient taking differently: Apply 1 application topically as needed (port access). ) 30 g 0  . LORazepam (ATIVAN) 0.5 MG tablet Take 1 tablet (0.5 mg total) by mouth every 6 (six) hours as needed (Nausea or vomiting). 30 tablet 0  . rivaroxaban (XARELTO) 20 MG TABS tablet Take 1 tablet (20 mg total) by mouth daily with supper. 30 tablet 4  . senna-docusate (SENOKOT-S) 8.6-50 MG tablet Take 1 tablet by mouth 2 (two) times daily. 20 tablet 0  . lidocaine (LIDODERM) 5 % Place 1 patch onto the skin daily. Remove & Discard patch within 12 hours or as directed by MD (Patient not taking: Reported on 12/10/2018) 15 patch 0  . ondansetron (ZOFRAN) 8 MG tablet Take 1 tablet (8 mg total) by mouth 2 (two) times daily as needed. Start on the third day after doxorubicin/cytoxan chemotherapy. (Patient not taking: Reported on 12/10/2018) 30 tablet 2  . polyethylene glycol (MIRALAX / GLYCOLAX) packet Take 17 g by mouth daily. (Patient not taking: Reported on 12/10/2018) 14 each 0  . procarbazine (MATULANE) 50 MG capsule Take 4 capsules (200 mg total) by mouth daily. Take daily on days 1-7 of each 21 day cycle. Follow low tyramine diet. (Patient not taking: Reported on 12/10/2018) 28 capsule 2  . prochlorperazine (COMPAZINE) 10 MG tablet Take 1 tablet (10 mg total) by mouth every 6 (six) hours as needed for  nausea or vomiting. (Patient not taking: Reported on 12/10/2018) 30 tablet 2   No current facility-administered medications for this encounter.     REVIEW OF SYSTEMS:  Notable for that above.   PHYSICAL EXAM:  vitals were not taken for this visit.   NAD   LABORATORY DATA:  Lab Results  Component Value Date   WBC 19.4 (H) 11/06/2018   HGB 9.6 (L) 11/06/2018   HCT 29.1 (L) 11/06/2018   MCV 84.1 11/06/2018   PLT 183 11/06/2018   CMP     Component Value Date/Time   NA 140 11/06/2018 0822   K 4.0 11/06/2018 0822   CL 104 11/06/2018 0822   CO2 23 11/06/2018 0822   GLUCOSE 85 11/06/2018 0822   BUN 13 11/06/2018 0822   CREATININE 0.97 11/06/2018 0822   CALCIUM 9.5 11/06/2018 0822   PROT 7.7 11/06/2018 0822   ALBUMIN 3.8 11/06/2018 0822   AST 15 11/06/2018 0822   ALT 13 11/06/2018 0822   ALKPHOS 133 (H) 11/06/2018 0822   BILITOT 0.4 11/06/2018 0822   GFRNONAA >60 11/06/2018 0822   GFRAA >60 11/06/2018 0822         RADIOGRAPHY: Nm Pet Image Restag (ps) Skull Base To Thigh  Result Date: 12/04/2018 CLINICAL DATA:  Subsequent treatment strategy for Hodgkin lymphoma. EXAM: NUCLEAR MEDICINE PET SKULL BASE TO THIGH TECHNIQUE: 9.5 mCi F-18 FDG was injected  intravenously. Full-ring PET imaging was performed from the skull base to thigh after the radiotracer. CT data was obtained and used for attenuation correction and anatomic localization. Fasting blood glucose: 90 mg/dl COMPARISON:  10/16/2018 FINDINGS: Mediastinal blood pool activity: SUV max 1.7 Liver activity: SUV max 2.7 NECK: No hypermetabolic lymph nodes in the neck. Incidental CT findings: none CHEST: Index right paratracheal node measures 2.0 cm short axis today compared to 1.4 cm previously. SUV max = 7.6 today, 7.8 previously. Index prevascular nodal mass measures 4.0 x 3.3 cm today compared to 3.5 x 2.7 cm previously. SUV max = 4.4 today, 4.9 previously. Index right cardiophrenic angle nodal mass measures 5.2 x 3.2 cm today  compared to 4.7 x 3.3 cm previously. SUV max = 13.1 today, 11.7 previously. 10 mm short axis right internal mammary node (74/4) was 9 mm previously. SUV max = 7.7 today, 2.9 previously. Incidental CT findings: Right Port-A-Cath tip is in the mid to lower right atrium. Stable 3 mm perifissural right lung nodule (25/8). 18 mm ground-glass opacity in the anterior right lower lobe (41/8) is new in the interval. ABDOMEN/PELVIS: No abnormal hypermetabolic activity within the liver, pancreas, adrenal glands, or spleen. No hypermetabolic lymph nodes in the abdomen or pelvis. Incidental CT findings: 12 mm low-density lesion lower pole right kidney is likely a cyst. SKELETON: No focal hypermetabolic activity to suggest skeletal metastasis. Incidental CT findings: No worrisome lytic or sclerotic osseous abnormality. IMPRESSION: 1. Previously identified thoracic index lesions are stable to minimally progressed in size without substantial increase in hypermetabolism. Features remain compatible with Deauville 4. 2. A right internal mammary node shows only minimal interval increase in size but more prominent increase in hypermetabolism, from Deauville 3 previously to Deauville 4 today. 3. No evidence for hypermetabolic disease in the abdomen or pelvis. 4. New 18 mm ground-glass opacity in the anterior right lower lobe, likely infectious/inflammatory. Attention on follow-up recommended. Electronically Signed   By: Misty Stanley M.D.   On: 12/04/2018 15:40   Dg Chest Portable 1 View  Result Date: 11/27/2018 CLINICAL DATA:  Wheezing EXAM: PORTABLE CHEST 1 VIEW COMPARISON:  Chest x-ray dated 09/28/2018 FINDINGS: The right-sided Port-A-Cath as well position. No pneumothorax. No large pleural effusion. There is a new opacity at the right lung base. The cardiomediastinal silhouette is unremarkable. IMPRESSION: 1. New airspace opacity at the right lung base concerning for developing pneumonia. A 4-6 week follow-up two-view chest x-ray  is recommended to confirm resolution of this finding. 2. Well-positioned right-sided Port-A-Cath. Electronically Signed   By: Constance Holster M.D.   On: 11/27/2018 20:08      IMPRESSION/PLAN:Today, I talked to the patient about the findings and work-up thus far. We discussed the patient's diagnosis of Hodgkin's Lymphoma demonstrating chemotherapy resistance,  and general treatment for this, highlighting the role of radiotherapy in the management. We discussed the available radiation techniques, and focused on the details of logistics and delivery.    We discussed the risks, benefits, and side effects of radiotherapy. Side effects may include but not necessarily be limited to: esophagitis, fatigue, skin irritation, internal organ injury to lungs/heart, possible secondary cancers. No guarantees of treatment were given.    The patient was encouraged to ask questions that I answered to the best of my ability.   Given his young age  I think he would benefit from the opinion of a [place] specialist as well as to receive radiotherapy at a University well versed in treating young adults and taking special  consideration of the risks of late effects for patients in his age group.  I told him about Valora Corporal MD who has a Research officer, trade union - I will refer him to her.    I encouraged him to call with any future questions. I wished him the best.  This encounter was provided by telemedicine platform Webex due to pandemic precautions.  The patient has given verbal consent for this type of encounter and has been advised to only accept a meeting of this type in a secure network environment. The time spent during this encounter was 35 minutes. The attendants for this meeting include Eppie Gibson  and Casimer Lanius.  During the encounter, Eppie Gibson was located at [facility] Radiation Oncology Department.  Diamante Rubin was located at home.      __________________________________________   Eppie Gibson, MD  This document serves as a record of services personally performed by Eppie Gibson, MD. It was created on her behalf by Rae Lips, a trained medical scribe. The creation of this record is based on the scribe's personal observations and the provider's statements to them. This document has been checked and approved by the attending provider.

## 2018-12-10 NOTE — Progress Notes (Signed)
HEMATOLOGY/ONCOLOGY CLINIC NOTE  Date of Service: 12/11/18     Patient Care Team: Patient, No Pcp Per as PCP - General (General Practice)  CHIEF COMPLAINTS/PURPOSE OF CONSULTATION:  Nodular sclerosis Hodgkin's Lymphoma   HISTORY OF PRESENTING ILLNESS:   David Lam is a wonderful 23 y.o. male who has been referred to [place] by Dr. 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 He is accompanied today by his mother and sister. The pt reports that he is doing well overall.   The pt presented to the ED on 05/07/18 after developing chest pain over 2 days, with positional elements. The pt was evaluated with a CXR which revealed a mediastinal mass that was biopsied on 05/10/18 and confirmed nodular sclerosis Hodgkin's lymphoma, as noted below.   The pt notes that he though his chest discomfort was due to working out, and denies noticing any swelling in the area. The pt denies any recent fevers, chills, night sweats, skin rashes, unexpected weight loss. The pt notes that his chest pain continues and is making his sleeping difficult. He denies any difficulty ambulating or SOB.   The pt reports that he has not had any prior medical problems, and has had left thumb and left ankle surgeries from injuries. 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   Of note prior to the patient's visit today, pt has had a [place] lymph node biopsy completed on 05/10/18 with results revealing Nodular sclerosis Hodgkin's Lymphoma    The pt also had a CTA Chest on 05/07/18 which revealed Bulky, bilateral anterior superior mediastinal confluent soft tissue masses which are not calcified in appearance and which do not appear to cause occlusion or significant luminal narrowing of traversing vasculature. Leading consideration is lymphoma, possibly Hodgkin's. Given lack of differentiating soft tissue densities, a teratoma is believed less likely. Nonseminomatous germ cell tumor  given presence of small effusions might also be within differential though no pulmonary lesions are visualized. Metastatic disease, infection or inflammatory process are believed less likely.  The pt also had a CT A/P on 05/08/18 which revealed No acute finding or evidence of malignancy in the abdomen.  Most recent lab results (05/09/18) of CBC is as follows: all values are WNL except for WBC at 10.6k, HGB at 11.9, HCT at 34.7, MCV at 78.5, ANC at 7.9k, Eosinophils abs at 600. 05/08/18 Sed Rate at 19  On review of systems, pt reports chest pain, moving his bowels well, and denies skin rashes, fevers, chills, night sweats, unexpected weight loss, SOB, headaches, changes in vision, urinary pain or discomfort, abdominal pains, testicular pain or swelling, leg swelling, arm swelling, and any other symptoms.   On PMHx the pt reports left thumb and left ankle surgeries, denies blood transfusions. On Social Hx the pt denies alcohol consumption and denies smoking cigarettes or other recreational drugs. 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 Denies tattoos.  On Family Hx the pt denies any blood disorders or cancers.  He denies any medications.  Interval History:   Pratt Bress returns today for management, evaluation after C3 Escalated BEACOPP treatment of his Classical Hodgkin's Lymphoma. The patient's last visit with [place] was on 11/06/18. The pt reports that he is doing well overall.  The pt reports that he was sneezing and wheezing a few weeks ago, for which he presented to the ED on 11/27/18 and was diagnosed with community acquired pneumonia. Denies having a fever, CP or SOB at that  time. He was discharged with Doxycycline and notes that he has used albuterol as needed for occasional mild wheezing. He notes that he is feeling much better at this time. He denies any SOB or CP at this time. He notes that he has continued on Xarelto without difficulty.   The pt notes that he is avoiding  crowds and denies current concerns for infections. He notes that he is feeling well overall. He notes that he is eating well, staying hydrated, and is staying active. He denies pain or redness at the port site.  Of note since the patient's last visit, pt has had a PET/CT completed on 12/04/18 with results revealing "Previously identified thoracic index lesions are stable to minimally progressed in size without substantial increase in hypermetabolism. Features remain compatible with Deauville 4. 2. A right internal mammary node shows only minimal interval increase in size but more prominent increase in hypermetabolism, from Deauville 3 previously to Deauville 4 today. 3. No evidence for hypermetabolic disease in the abdomen or pelvis. 4. New 18 mm ground-glass opacity in the anterior right lower lobe, likely infectious/inflammatory. Attention on follow-up recommended".  Lab results today (12/11/18) of CBC w/diff and CMP is as follows: all values are WNL except for RBC at 4.12, HGB at 11.1, HCT at 33.3, RDW at 17.5, CMP at 174. 12/11/18 Sed Rate is pending  On review of systems, pt reports good energy levels, breathing well, infrequent wheezing, eating well, staying hydrated, staying active, and denies bone pains, SOB, CP, pain or redness at port site, new lumps or bumps, mouth sores, leg swelling, abdominal pains, and any other symptoms.   MEDICAL HISTORY:  Past Medical History:  Diagnosis Date   Non Hodgkin's lymphoma ([place])    Non Hodgkin's lymphoma ([place])     SURGICAL HISTORY: Past Surgical History:  Procedure Laterality Date   ANKLE SURGERY Left    cleaned up    FRACTURE SURGERY Left    arm   IR IMAGING GUIDED PORT INSERTION  06/03/2018   MEDIASTINOTOMY CHAMBERLAIN MCNEIL N/A 05/10/2018   Procedure: Mediastinal mass biopsy ;  Surgeon: Grace Isaac, MD;  Location: [facility] OR;  Service: Thoracic;  Laterality: N/A;   ORIF FINGER / THUMB FRACTURE Left     SOCIAL HISTORY: Social  History   Socioeconomic History   Marital status: Single    Spouse name: Not on file   Number of children: Not on file   Years of education: Not on file   Highest education level: Not on file  Occupational History   Not on file  Social Needs   Financial resource strain: Not on file   Food insecurity:    Worry: Not on file    Inability: Not on file   Transportation needs:    Medical: No    Non-medical: No  Tobacco Use   Smoking status: Never Smoker   Smokeless tobacco: Never Used  Substance and Sexual Activity   Alcohol use: Never    Frequency: Never   Drug use: Never   Sexual activity: Not on file  Lifestyle   Physical activity:    Days per week: Not on file    Minutes per session: Not on file   Stress: Not on file  Relationships   Social connections:    Talks on phone: Not on file    Gets together: Not on file    Attends religious service: Not on file    Active member of club or organization: Not on file  Attends meetings of clubs or organizations: Not on file    Relationship status: Not on file   Intimate partner violence:    Fear of current or ex partner: No    Emotionally abused: No    Physically abused: No    Forced sexual activity: No  Other Topics Concern   Not on file  Social History Narrative   Not on file    FAMILY HISTORY: No family history on file.  ALLERGIES:  has No Known Allergies.  MEDICATIONS:  Current Outpatient Medications  Medication Sig Dispense Refill   Diclofenac Sodium 3 % GEL Place 1 application onto the skin 2 (two) times daily. 100 g 0   diphenhydrAMINE (BENADRYL) 25 mg capsule Take 25 mg by mouth every 6 (six) hours as needed for allergies.      HYDROcodone-acetaminophen (NORCO) 7.5-325 MG tablet Take 1 tablet by mouth every 6 (six) hours as needed for moderate pain. 30 tablet 0   lidocaine (LIDODERM) 5 % Place 1 patch onto the skin daily. Remove & Discard patch within 12 hours or as directed by MD  (Patient not taking: Reported on 12/10/2018) 15 patch 0   lidocaine-prilocaine (EMLA) cream Apply 1 application topically as needed. (Patient taking differently: Apply 1 application topically as needed (port access). ) 30 g 0   LORazepam (ATIVAN) 0.5 MG tablet Take 1 tablet (0.5 mg total) by mouth every 6 (six) hours as needed (Nausea or vomiting). 30 tablet 0   ondansetron (ZOFRAN) 8 MG tablet Take 1 tablet (8 mg total) by mouth 2 (two) times daily as needed. Start on the third day after doxorubicin/cytoxan chemotherapy. (Patient not taking: Reported on 12/10/2018) 30 tablet 2   polyethylene glycol (MIRALAX / GLYCOLAX) packet Take 17 g by mouth daily. (Patient not taking: Reported on 12/10/2018) 14 each 0   procarbazine (MATULANE) 50 MG capsule Take 4 capsules (200 mg total) by mouth daily. Take daily on days 1-7 of each 21 day cycle. Follow low tyramine diet. (Patient not taking: Reported on 12/10/2018) 28 capsule 2   prochlorperazine (COMPAZINE) 10 MG tablet Take 1 tablet (10 mg total) by mouth every 6 (six) hours as needed for nausea or vomiting. (Patient not taking: Reported on 12/10/2018) 30 tablet 2   rivaroxaban (XARELTO) 20 MG TABS tablet Take 1 tablet (20 mg total) by mouth daily with supper. 30 tablet 4   senna-docusate (SENOKOT-S) 8.6-50 MG tablet Take 1 tablet by mouth 2 (two) times daily. 20 tablet 0   No current facility-administered medications for this visit.     REVIEW OF SYSTEMS:    A 10+ POINT REVIEW OF SYSTEMS WAS OBTAINED including neurology, dermatology, psychiatry, cardiac, respiratory, lymph, extremities, GI, GU, Musculoskeletal, constitutional, breasts, reproductive, HEENT.  All pertinent positives are noted in the HPI.  All others are negative.   PHYSICAL EXAMINATION: ECOG PERFORMANCE STATUS: 1 - Symptomatic but completely ambulatory  Vitals:   12/11/18 1345  BP: 126/81  Pulse: 89  Resp: 18  Temp: 98 F (36.7 C)  SpO2: 100%   Filed Weights   12/11/18 1345    Weight: 192 lb 6.4 oz (87.3 kg)   .Body mass index is 29.25 kg/m.  GENERAL:alert, in no acute distress and comfortable SKIN: no acute rashes, no significant lesions EYES: conjunctiva are pink and non-injected, sclera anicteric OROPHARYNX: MMM, no exudates, no oropharyngeal erythema or ulceration NECK: supple, no JVD LYMPH:  no palpable lymphadenopathy in the cervical, axillary or inguinal regions LUNGS: clear to auscultation b/l with  normal respiratory effort HEART: regular rate & rhythm ABDOMEN:  normoactive bowel sounds , non tender, not distended. No palpable hepatosplenomegaly.  Extremity: no pedal edema PSYCH: alert & oriented x 3 with fluent speech NEURO: no focal motor/sensory deficits   LABORATORY DATA:  I have reviewed the data as listed  . CBC Latest Ref Rng & Units 12/11/2018 11/06/2018 10/23/2018  WBC 4.0 - 10.5 K/uL 6.9 19.4(H) 1.4(L)  Hemoglobin 13.0 - 17.0 g/dL 11.1(L) 9.6(L) 8.3(L)  Hematocrit 39.0 - 52.0 % 33.3(L) 29.1(L) 24.3(L)  Platelets 150 - 400 K/uL 278 183 207   ANC 1200 . CMP Latest Ref Rng & Units 12/11/2018 11/06/2018 10/23/2018  Glucose 70 - 99 mg/dL 85 85 110(H)  BUN 6 - 20 mg/dL _0 Creatinine 0.61 - 1.24 mg/dL 0.90 0.97 0.78  Sodium 135 - 145 mmol/L 138 140 140  Potassium 3.5 - 5.1 mmol/L 4.2 4.0 3.6  Chloride 98 - 111 mmol/L 104 104 104  CO2 22 - 32 mmol/L _1 Calcium 8.9 - 10.3 mg/dL 9.3 9.5 8.6(L)  Total Protein 6.5 - 8.1 g/dL 7.8 7.7 6.8  Total Bilirubin 0.3 - 1.2 mg/dL 0.5 0.4 0.3  Alkaline Phos 38 - 126 U/L 174(H) 133(H) 138(H)  AST 15 - 41 U/L 20 15 10(L)  ALT 0 - 44 U/L _2 05/10/18 Biopsy:   05/22/18 BM Bx:    RADIOGRAPHIC STUDIES: I have personally reviewed the radiological images as listed and agreed with the findings in the report. Nm Pet Image Restag (ps) Skull Base To Thigh  Result Date: 12/04/2018 CLINICAL DATA:  Subsequent treatment strategy for Hodgkin lymphoma. EXAM: NUCLEAR MEDICINE PET SKULL BASE  TO THIGH TECHNIQUE: 9.5 mCi F-18 FDG was injected intravenously. Full-ring PET imaging was performed from the skull base to thigh after the radiotracer. CT data was obtained and used for attenuation correction and anatomic localization. Fasting blood glucose: 90 mg/dl COMPARISON:  10/16/2018 FINDINGS: Mediastinal blood pool activity: SUV max 1.7 Liver activity: SUV max 2.7 NECK: No hypermetabolic lymph nodes in the neck. Incidental CT findings: none CHEST: Index right paratracheal node measures 2.0 cm short axis today compared to 1.4 cm previously. SUV max = 7.6 today, 7.8 previously. Index prevascular nodal mass measures 4.0 x 3.3 cm today compared to 3.5 x 2.7 cm previously. SUV max = 4.4 today, 4.9 previously. Index right cardiophrenic angle nodal mass measures 5.2 x 3.2 cm today compared to 4.7 x 3.3 cm previously. SUV max = 13.1 today, 11.7 previously. 10 mm short axis right internal mammary node (74/4) was 9 mm previously. SUV max = 7.7 today, 2.9 previously. Incidental CT findings: Right Port-A-Cath tip is in the mid to lower right atrium. Stable 3 mm perifissural right lung nodule (25/8). 18 mm ground-glass opacity in the anterior right lower lobe (41/8) is new in the interval. ABDOMEN/PELVIS: No abnormal hypermetabolic activity within the liver, pancreas, adrenal glands, or spleen. No hypermetabolic lymph nodes in the abdomen or pelvis. Incidental CT findings: 12 mm low-density lesion lower pole right kidney is likely a cyst. SKELETON: No focal hypermetabolic activity to suggest skeletal metastasis. Incidental CT findings: No worrisome lytic or sclerotic osseous abnormality. IMPRESSION: 1. Previously identified thoracic index lesions are stable to minimally progressed in size without substantial increase in hypermetabolism. Features remain compatible with Deauville 4. 2. A right internal mammary node shows only minimal interval increase in size but more prominent increase in hypermetabolism, from Deauville  3 previously to [facility]  4 today. 3. No evidence for hypermetabolic disease in the abdomen or pelvis. 4. New 18 mm ground-glass opacity in the anterior right lower lobe, likely infectious/inflammatory. Attention on follow-up recommended. Electronically Signed   By: Misty Stanley M.D.   On: 12/04/2018 15:40   Dg Chest Portable 1 View  Result Date: 11/27/2018 CLINICAL DATA:  Wheezing EXAM: PORTABLE CHEST 1 VIEW COMPARISON:  Chest x-ray dated 09/28/2018 FINDINGS: The right-sided Port-A-Cath as well position. No pneumothorax. No large pleural effusion. There is a new opacity at the right lung base. The cardiomediastinal silhouette is unremarkable. IMPRESSION: 1. New airspace opacity at the right lung base concerning for developing pneumonia. A 4-6 week follow-up two-view chest x-ray is recommended to confirm resolution of this finding. 2. Well-positioned right-sided Port-A-Cath. Electronically Signed   By: Constance Holster M.D.   On: 11/27/2018 20:08    ASSESSMENT & PLAN:   23 y.o. male with  1. 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 Leading consideration is lymphoma, possibly Hodgkin's. Given lack of differentiating soft tissue densities, a teratoma is believed less likely. Nonseminomatous germ cell tumor given presence of small effusions might also be within differential though no pulmonary lesions are visualized. Metastatic disease, infection or inflammatory process are believed less likely.    05/08/18 CT A/P revealed No acute finding or evidence of malignancy in the abdomen.  05/08/18 ECHO revealed LV EF of 55-60%   05/10/18 Scalene lymph node Biopsy revealed Nodular sclerosis Hodgkin's Lymphoma   05/08/18 ECHO revealed LV EF of  55-60%  05/16/18 Hep B, Hep C, and HIV labs all negative   05/21/18 PET/CT revealed Hypermetabolic mediastinal adenopathy is identified compatible with history of lymphoma. No hypermetabolic lymph nodes within the neck, abdomen or pelvis.  05/22/18 BM Bx was negative   S/p 3 cycles of ABVD  08/16/18 PET/CT revealed Since the prior PET of 05/21/2018, mild disease progression, as evidenced by increased activity and less so size of hypermetabolic thoracic nodes. (Deauville 4) 2. No new or extrathoracic sites of disease. 3. Significant hypermetabolic "brown" fat throughout the neck and chest, mildly degrading evaluation. Discussed the 08/16/18 PET/CT with radiology - confirmed concern for residual Deauville 4 disease   S/p 2 cycles of Escalated BEACOPP. Held C2D8 due to significant neutropenia.  10/16/18 PET/CT revealed Further response to therapy of hypermetabolic thoracic adenopathy. (Deauville 4) 2. Diffuse marrow hypermetabolism likely relates to marrow stimulation by chemotherapy. 3. Mild hypermetabolism and subcutaneous nodularity superficial to the left mandible. Indeterminate. Consider physical exam correlation.  S/p 3 Cycle of Escalated BEACOPP. Held at home Procarbazine after C3D8.  2. B/L great toe subungal hematomas - likely from chemotherapy -podiatry referral given -Patient saw Podiatry, and toe nail was removed -Continue Epsom salt soaks   3. Pulmonary Embolism 09/04/18 CTA Chest revealed Positive for 2 acute subsegmental pulmonary emboli in the right upper lobe. 2. Heterogeneous aeration diffusely, likely small airways disease with air trapping given there is also generalized bronchial wall thickening. 3. 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 Tumor as risk factor for PE  4) Severe Neutropenia - no fever.  PLAN: -Discussed pt labwork today, 12/11/18; WBC and PLT normal, HGB improved to 11.1, Alk Phos is slightly elevated at 174 suspected to be chemotherapy  effect. -12/11/18 Sed rate is  pending -Discussed the 12/04/18 PET/CT which revealed "Previously identified thoracic index lesions are stable to minimally progressed in size without substantial increase in hypermetabolism. Features remain compatible with Deauville 4. 2. A right internal mammary node shows only minimal interval increase in size but more prominent increase in hypermetabolism, from Deauville 3 previously to Deauville 4 today. 3. No evidence for hypermetabolic disease in the abdomen or pelvis. 4. New 18 mm ground-glass opacity in the anterior right lower lobe, likely infectious/inflammatory. Attention on follow-up recommended" -Discussed that the patient's recent pneumonia infection and related inflammation confound the radiographic analysis and that the imaging cannot differentiate between infection and inflammation vs true progression. -Will order repeat PET/CT in 2 months, to allow for pneumonia and allergy related changes to settle down and get a more definitive picture of treatment response -Will also refer the pt to Dr. Jolayne Haines in Med Onc at [facility] to on-board transplant team if pt is proven to have persistent or progressive disease, and for assessment of necessity for repeat biopsy -My colleague Dr. Isidore Moos in [place] saw the pt yesterday and has referred this pt to [facility] -Discussed that even if there were not evidence of active disease, given his initially bulky disease, there would still be a role for RT consideration -Advised infection prevention precautions, frequent handwashing and wearing a mask when leaving the house. Avoid outside foods as much as possible. -Will continue 36m Xarelto for at least 6 months total, and in the absence of malignancy or other provoking risk factors -Discussed that the pt should avoid elective procedures while on blood thinners -Continue Senna S and Miralax -Pt will avoid NSAIDs -Continue Vitamin B complex -Continued to advise  infection prevention strategies, crowd avoidance, and frequent handwashing -Will see the pt back in 8 weeks   PET/CT in 7 weeks RTC with Dr KIrene Limbowith labs in 8 weeks with port flush Will refer to Dr WLetta Moynahanand Dr SJolayne Hainesfor 2nd opinion (Lovey Newcomerwill help with this)   All of the patients questions were answered with apparent satisfaction. The patient knows to call the clinic with any problems, questions or concerns.  The total time spent in the appt was 40 minutes and more than 50% was on counseling and direct patient cares.    GSullivan LoneMD MS AAHIVMS [facility] CharlesCPerry County General HospitalHematology/Oncology Physician CMountain [facility] (Office):       [phone](Work cell):  [phone](Fax):           [phone] 12/11/2018 2:50 PM   I, SBaldwin Jamaica am acting as a scribe for Dr. GSullivan Lone   .I have reviewed the above documentation for accuracy and completeness, and I agree with the above. .Brunetta GeneraMD

## 2018-12-11 ENCOUNTER — Other Ambulatory Visit: Payer: BLUE CROSS/BLUE SHIELD

## 2018-12-11 ENCOUNTER — Other Ambulatory Visit: Payer: Self-pay

## 2018-12-11 ENCOUNTER — Inpatient Hospital Stay: Payer: BC Managed Care – PPO | Attending: Hematology | Admitting: Hematology

## 2018-12-11 ENCOUNTER — Inpatient Hospital Stay: Payer: BC Managed Care – PPO

## 2018-12-11 VITALS — BP 126/81 | HR 89 | Temp 98.0°F | Resp 18 | Ht 68.0 in | Wt 192.4 lb

## 2018-12-11 DIAGNOSIS — D709 Neutropenia, unspecified: Secondary | ICD-10-CM | POA: Diagnosis not present

## 2018-12-11 DIAGNOSIS — Z5111 Encounter for antineoplastic chemotherapy: Secondary | ICD-10-CM

## 2018-12-11 DIAGNOSIS — C8118 Nodular sclerosis classical Hodgkin lymphoma, lymph nodes of multiple sites: Secondary | ICD-10-CM | POA: Diagnosis not present

## 2018-12-11 DIAGNOSIS — R748 Abnormal levels of other serum enzymes: Secondary | ICD-10-CM | POA: Insufficient documentation

## 2018-12-11 DIAGNOSIS — I2699 Other pulmonary embolism without acute cor pulmonale: Secondary | ICD-10-CM | POA: Insufficient documentation

## 2018-12-11 DIAGNOSIS — R911 Solitary pulmonary nodule: Secondary | ICD-10-CM | POA: Insufficient documentation

## 2018-12-11 DIAGNOSIS — Z95828 Presence of other vascular implants and grafts: Secondary | ICD-10-CM

## 2018-12-11 LAB — CMP (CANCER CENTER ONLY)
ALT: 22 U/L (ref 0–44)
AST: 20 U/L (ref 15–41)
Albumin: 3.9 g/dL (ref 3.5–5.0)
Alkaline Phosphatase: 174 U/L — ABNORMAL HIGH (ref 38–126)
Anion gap: 8 (ref 5–15)
BUN: 11 mg/dL (ref 6–20)
CO2: 26 mmol/L (ref 22–32)
Calcium: 9.3 mg/dL (ref 8.9–10.3)
Chloride: 104 mmol/L (ref 98–111)
Creatinine: 0.9 mg/dL (ref 0.61–1.24)
GFR, Est AFR Am: >60 mL/min (ref >60)
GFR, Estimated: >60 mL/min (ref >60)
Glucose, Bld: 85 mg/dL (ref 70–99)
Potassium: 4.2 mmol/L (ref 3.5–5.1)
Sodium: 138 mmol/L (ref 135–145)
Total Bilirubin: 0.5 mg/dL (ref 0.3–1.2)
Total Protein: 7.8 g/dL (ref 6.5–8.1)

## 2018-12-11 LAB — CBC WITH DIFFERENTIAL/PLATELET
Abs Immature Granulocytes: 0.03 10*3/uL (ref 0.00–0.07)
Basophils Absolute: 0 10*3/uL (ref 0.0–0.1)
Basophils Relative: 0 %
Eosinophils Absolute: 0.4 10*3/uL (ref 0.0–0.5)
Eosinophils Relative: 6 %
HCT: 33.3 % — ABNORMAL LOW (ref 39.0–52.0)
Hemoglobin: 11.1 g/dL — ABNORMAL LOW (ref 13.0–17.0)
Immature Granulocytes: 0 %
Lymphocytes Relative: 16 %
Lymphs Abs: 1.1 10*3/uL (ref 0.7–4.0)
MCH: 26.9 pg (ref 26.0–34.0)
MCHC: 33.3 g/dL (ref 30.0–36.0)
MCV: 80.8 fL (ref 80.0–100.0)
Monocytes Absolute: 0.5 10*3/uL (ref 0.1–1.0)
Monocytes Relative: 7 %
Neutro Abs: 4.9 10*3/uL (ref 1.7–7.7)
Neutrophils Relative %: 71 %
Platelets: 278 10*3/uL (ref 150–400)
RBC: 4.12 MIL/uL — ABNORMAL LOW (ref 4.22–5.81)
RDW: 17.5 % — ABNORMAL HIGH (ref 11.5–15.5)
WBC: 6.9 10*3/uL (ref 4.0–10.5)
nRBC: 0 % (ref 0.0–0.2)

## 2018-12-11 LAB — SEDIMENTATION RATE: Sed Rate: 30 mm/hr — ABNORMAL HIGH (ref 0–16)

## 2018-12-11 MED ORDER — SODIUM CHLORIDE 0.9% FLUSH
10.0000 mL | INTRAVENOUS | Status: DC | PRN
  Administered 2018-12-11: 13:00:00 10 mL
  Filled 2018-12-11: qty 10

## 2018-12-11 MED ORDER — HEPARIN SOD (PORK) LOCK FLUSH 100 UNIT/ML IV SOLN
500.0000 [IU] | Freq: Once | INTRAVENOUS | Status: AC | PRN
  Administered 2018-12-11: 500 [IU]
  Filled 2018-12-11: qty 5

## 2018-12-12 ENCOUNTER — Telehealth: Payer: Self-pay | Admitting: Hematology

## 2018-12-12 NOTE — Telephone Encounter (Signed)
Scheduled appt per 5/20 los. ° °Patient aware of appt date and time. °

## 2018-12-19 ENCOUNTER — Telehealth: Payer: Self-pay | Admitting: *Deleted

## 2018-12-19 NOTE — Telephone Encounter (Signed)
Patient requested his medical records from Dr. Grier Mitts care and [facility] be sent to Dr. Zella Richer at [place] fax# [phone], phone#[phone]. He contacted them to make appointment for a second opinion and they told him they could not see everything in care everywhere. Message sent to [facility].

## 2018-12-22 ENCOUNTER — Other Ambulatory Visit: Payer: Self-pay

## 2018-12-22 ENCOUNTER — Encounter (HOSPITAL_COMMUNITY): Payer: Self-pay | Admitting: Emergency Medicine

## 2018-12-22 ENCOUNTER — Emergency Department (HOSPITAL_COMMUNITY)
Admission: EM | Admit: 2018-12-22 | Discharge: 2018-12-22 | Disposition: A | Discharge status: Home / Self Care | Payer: BLUE CROSS/BLUE SHIELD | Attending: Emergency Medicine | Admitting: Emergency Medicine

## 2018-12-22 ENCOUNTER — Emergency Department (HOSPITAL_COMMUNITY): Payer: BLUE CROSS/BLUE SHIELD

## 2018-12-22 DIAGNOSIS — R0602 Shortness of breath: Secondary | ICD-10-CM | POA: Insufficient documentation

## 2018-12-22 DIAGNOSIS — R05 Cough: Secondary | ICD-10-CM | POA: Insufficient documentation

## 2018-12-22 DIAGNOSIS — Z7901 Long term (current) use of anticoagulants: Secondary | ICD-10-CM | POA: Insufficient documentation

## 2018-12-22 DIAGNOSIS — R062 Wheezing: Secondary | ICD-10-CM | POA: Insufficient documentation

## 2018-12-22 DIAGNOSIS — Z8572 Personal history of non-Hodgkin lymphomas: Secondary | ICD-10-CM | POA: Insufficient documentation

## 2018-12-22 DIAGNOSIS — Z9221 Personal history of antineoplastic chemotherapy: Secondary | ICD-10-CM | POA: Insufficient documentation

## 2018-12-22 DIAGNOSIS — R058 Other specified cough: Secondary | ICD-10-CM

## 2018-12-22 DIAGNOSIS — Z79899 Other long term (current) drug therapy: Secondary | ICD-10-CM | POA: Insufficient documentation

## 2018-12-22 DIAGNOSIS — Z20828 Contact with and (suspected) exposure to other viral communicable diseases: Secondary | ICD-10-CM | POA: Diagnosis not present

## 2018-12-22 HISTORY — DX: Non-Hodgkin lymphoma, unspecified, lymph nodes of inguinal region and lower limb: C85.95

## 2018-12-22 LAB — CBC WITH DIFFERENTIAL/PLATELET
Abs Immature Granulocytes: 0.02 10*3/uL (ref 0.00–0.07)
Basophils Absolute: 0.1 10*3/uL (ref 0.0–0.1)
Basophils Relative: 1 %
Eosinophils Absolute: 0.9 10*3/uL — ABNORMAL HIGH (ref 0.0–0.5)
Eosinophils Relative: 11 %
HCT: 37.8 % — ABNORMAL LOW (ref 39.0–52.0)
Hemoglobin: 12.5 g/dL — ABNORMAL LOW (ref 13.0–17.0)
Immature Granulocytes: 0 %
Lymphocytes Relative: 13 %
Lymphs Abs: 0.9 10*3/uL (ref 0.7–4.0)
MCH: 26.6 pg (ref 26.0–34.0)
MCHC: 33.1 g/dL (ref 30.0–36.0)
MCV: 80.4 fL (ref 80.0–100.0)
Monocytes Absolute: 0.5 10*3/uL (ref 0.1–1.0)
Monocytes Relative: 7 %
Neutro Abs: 5.1 10*3/uL (ref 1.7–7.7)
Neutrophils Relative %: 68 %
Platelets: 350 10*3/uL (ref 150–400)
RBC: 4.7 MIL/uL (ref 4.22–5.81)
RDW: 16.8 % — ABNORMAL HIGH (ref 11.5–15.5)
WBC: 7.6 10*3/uL (ref 4.0–10.5)
nRBC: 0 % (ref 0.0–0.2)

## 2018-12-22 LAB — BASIC METABOLIC PANEL
Anion gap: 11 (ref 5–15)
BUN: 10 mg/dL (ref 6–20)
CO2: 22 mmol/L (ref 22–32)
Calcium: 9.6 mg/dL (ref 8.9–10.3)
Chloride: 104 mmol/L (ref 98–111)
Creatinine, Ser: 0.88 mg/dL (ref 0.61–1.24)
GFR calc Af Amer: >60 mL/min (ref >60)
GFR calc non Af Amer: >60 mL/min (ref >60)
Glucose, Bld: 82 mg/dL (ref 70–99)
Potassium: 4 mmol/L (ref 3.5–5.1)
Sodium: 137 mmol/L (ref 135–145)

## 2018-12-22 LAB — SARS CORONAVIRUS 2 BY RT PCR (HOSPITAL ORDER, PERFORMED IN ~~LOC~~ HOSPITAL LAB): SARS Coronavirus 2: NEGATIVE

## 2018-12-22 LAB — TROPONIN I: Troponin I: <0.03 ng/mL (ref <0.03)

## 2018-12-22 LAB — LACTIC ACID, PLASMA: Lactic Acid, Venous: 1.1 mmol/L (ref 0.5–1.9)

## 2018-12-22 MED ORDER — ALBUTEROL SULFATE HFA 108 (90 BASE) MCG/ACT IN AERS: 1.0000 | INHALATION_SPRAY | Freq: Four times a day (QID) | RESPIRATORY_TRACT | 0 refills | Status: AC | PRN

## 2018-12-22 NOTE — ED Triage Notes (Signed)
Cough x a couple of days  And was seen here before for same  And had some pneumonia , has been using inhaler but  Not helping

## 2018-12-22 NOTE — ED Provider Notes (Signed)
[facility] EMERGENCY DEPARTMENT Provider Note   CSN: [id number] Arrival date & time: 12/22/18  1125    History   Chief Complaint Chief Complaint  Patient presents with   Cough    HPI David Lam is a 23 y.o. 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  He was compliant with antibiotic.  He has had persistent, mild dry cough throughout the day and worse at nighttime.  Associated with subjective wheezing and slight shortness of breath when he lays down to go to sleep at night that he thinks is related to sinus congestion and allergies, some right sided chest discomfort intermittently, non exertional or pleuritic.  Wheezing and cough improve with albuterol inhaler which he has needed to continue using twice daily.  Reports chronic rhinorrhea, nasal congestion that he attributes to his allergies.  States he wants to make sure that the pneumonia cleared up since he still having a cough.  Tested negative for COVID on 5/6.  Lives alone and denies direct exposure to sick contacts.  No exposure to confirmed or suspected COVID.  No recent travel.  Has been compliant with his Xarelto.  Denies associated fever, chills, sore throat, chest pain, exertional shortness of breath, nausea, vomiting, abdominal pain, diarrhea.  [name] was evaluated in Emergency Department on 12/22/2018 for the symptoms described in the history of present illness. He was evaluated in the context of the global COVID-19 pandemic, which necessitated consideration that the patient might be at risk for infection with the SARS-CoV-2 virus that causes COVID-19. Institutional protocols and algorithms that pertain to the evaluation of  patients at risk for COVID-19 are in a state of rapid change based on information released by regulatory bodies including the CDC and federal and state organizations. These policies and algorithms were followed during the patient's care in the ED.    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   Rupture of radial collateral ligament of left thumb 12/30/2014   Left ankle pain 11/16/2014   Closed fracture of shaft of left radius and ulna 10/31/2014   Dislocation of carpometacarpal joint of left thumb 10/31/2014    Past Surgical History:  Procedure Laterality Date   ANKLE SURGERY Left    cleaned up    FRACTURE SURGERY Left    arm   IR IMAGING GUIDED PORT INSERTION  06/03/2018   MEDIASTINOTOMY CHAMBERLAIN MCNEIL N/A 05/10/2018   Procedure: Mediastinal mass biopsy ;  Surgeon: Grace Isaac, MD;  Location: [place];  Service: Thoracic;  Laterality: N/A;   ORIF FINGER / THUMB FRACTURE Left         Home Medications    Prior to Admission medications   Medication Sig Start Date End Date Taking? Authorizing Provider  albuterol (VENTOLIN HFA) 108 (90 Base) MCG/ACT inhaler Inhale 1-2 puffs into the lungs every 6 (six) hours as needed for wheezing or shortness of breath. 12/22/18   Kinnie Feil, PA-C  Diclofenac Sodium 3 % GEL Place 1 application onto the skin 2 (two) times daily. 09/25/18   [name], MD  diphenhydrAMINE (BENADRYL) 25 mg capsule Take 25 mg by mouth every 6 (six) hours as needed for  allergies.     [provider]  HYDROcodone-acetaminophen (NORCO) 7.5-325 MG tablet Take 1 tablet by mouth every 6 (six) hours as needed for moderate pain. 09/25/18   [name], MD  lidocaine (LIDODERM) 5 % Place 1 patch onto the skin daily. Remove & Discard patch within 12 hours or as directed by MD Patient not taking: Reported on 12/10/2018 09/25/18   [name], MD  lidocaine-prilocaine (EMLA) cream Apply 1 application topically as needed. Patient taking differently: Apply 1 application topically as needed (port access).  06/04/18   [name], MD  LORazepam (ATIVAN) 0.5 MG tablet Take 1 tablet (0.5 mg total) by mouth every 6 (six) hours as needed (Nausea or vomiting). 08/30/18   [name], MD  ondansetron (ZOFRAN) 8 MG tablet Take 1 tablet (8 mg total) by mouth 2 (two) times daily as needed. Start on the third day after doxorubicin/cytoxan chemotherapy. Patient not taking: Reported on 12/10/2018 09/25/18   [name], MD  polyethylene glycol [employer] / Floria Raveling) packet Take 17 g by mouth daily. Patient not taking: Reported on 12/10/2018 09/21/18   Aline August, MD  procarbazine (MATULANE) 50 MG capsule Take 4 capsules (200 mg total) by mouth daily. Take daily on days 1-7 of each 21 day cycle. Follow low tyramine diet. Patient not taking: Reported on 12/10/2018 09/04/18   [name], MD  prochlorperazine (COMPAZINE) 10 MG tablet Take 1 tablet (10 mg total) by mouth every 6 (six) hours as needed for nausea or vomiting. Patient not taking: Reported on 12/10/2018 09/25/18   [name], MD  rivaroxaban (XARELTO) 20 MG TABS tablet Take 1 tablet (20 mg total) by mouth daily with supper. 10/02/18   [name], MD  senna-docusate (SENOKOT-S) 8.6-50 MG tablet Take 1 tablet by mouth 2 (two) times daily. 09/20/18   Aline August, MD    Family History No family history on file.  Social History Social History   Tobacco Use    Smoking status: Never Smoker   Smokeless tobacco: Never Used  Substance Use Topics   Alcohol use: Never    Frequency: Never   Drug use: Never     Allergies   Patient has no known allergies.   Review of Systems Review of Systems  HENT: Positive for congestion, postnasal drip and rhinorrhea.   Respiratory: Positive for cough.   Allergic/Immunologic: Positive for immunocompromised state.  Hematological: Bruises/bleeds easily.  All other systems reviewed and are negative.    Physical Exam Updated Vital Signs BP 123/83 (BP Location: Right Arm)    Pulse (!) 111    Temp 98.4 F (36.9 C) (Oral)    Resp 20    SpO2 97%   Physical Exam Vitals signs and nursing note reviewed.  Constitutional:      General: He is not in acute distress.    Appearance: He is well-developed.     Comments: NAD.  HENT:     Head: Normocephalic and atraumatic.     Right Ear: External ear normal.  Left Ear: External ear normal.     Nose: Congestion and rhinorrhea present.     Comments: Sounds congestion, nasal mucosa erythematous and edematous with rhinorrhea clear.  Eyes:     General: No scleral icterus.    Conjunctiva/sclera: Conjunctivae normal.  Neck:     Musculoskeletal: Normal range of motion and neck supple.  Cardiovascular:     Rate and Rhythm: Regular rhythm. Tachycardia present.     Heart sounds: Normal heart sounds. No murmur.     Comments: HR 110-130s. RRR. No LE edema or calf tenderness.  Pulmonary:     Effort: Pulmonary effort is normal.     Breath sounds: Normal breath sounds.     Comments: No wheezing, crackles. Normal WOB. Speaking in full sentences.  Musculoskeletal: Normal range of motion.        General: No deformity.  Skin:    General: Skin is warm and dry.     Capillary Refill: Capillary refill takes less than 2 seconds.  Neurological:     Mental Status: He is alert and oriented to person, place, and time.  Psychiatric:        Behavior: Behavior normal.         Thought Content: Thought content normal.        Judgment: Judgment normal.      ED Treatments / Results  Labs (all labs ordered are listed, but only abnormal results are displayed) Labs Reviewed  CBC WITH DIFFERENTIAL/PLATELET - Abnormal; Notable for the following components:      Result Value   Hemoglobin 12.5 (*)    HCT 37.8 (*)    RDW 16.8 (*)    Eosinophils Absolute 0.9 (*)    All other components within normal limits  SARS CORONAVIRUS 2 (HOSPITAL ORDER, [place] [facility])  BASIC METABOLIC PANEL  LACTIC ACID, PLASMA  TROPONIN I  LACTIC ACID, PLASMA    EKG None  Radiology Dg Chest Portable 1 View  Result Date: 12/22/2018 CLINICAL DATA:  Cough EXAM: PORTABLE CHEST 1 VIEW COMPARISON:  11/27/2018 FINDINGS: Right jugular Port-A-Cath is stable. Normal heart size. Prominent soft tissues in the upper mediastinum are stable. Lungs are clear. IMPRESSION: Airspace disease at the right base has resolved. Examination is otherwise unchanged. Electronically Signed   By: Marybelle Killings M.D.   On: 12/22/2018 13:49    Procedures Procedures (including critical care time)  Medications Ordered in ED Medications - No data to display   Initial Impression / Assessment and Plan / ED Course  I have reviewed the triage vital signs and the nursing notes.  Pertinent labs & imaging results that were available during my care of the patient were reviewed by me and considered in my medical decision making (see chart for details).  Clinical Course as of Dec 21 1637  Sun Dec 22, 2018  1231 Spoke to sister on phone to update on POC in ED. She is in agreement.    [CG]    Clinical Course User Index [CG] Kinnie Feil, PA-C      23 yo with h/o PE compliant with xarelto, hodgkin's lymphoma s/p chemo and recent CAP s/p antibiotics presents for persistent dry cough.  Patient oncology notes reviewed.  PET/CT on 5/13 revealed thoracic lesions stable to minimally progressive in  size without increase in hypermetabolism and ground glass opacity in RLL, per oncology this finding may be related to recent infectious/inflammatory process in lungs from pneumonia.  At that time oncology unable to determine  if findings were from infectious/inflammatory response or true disease progression.  Plan for repeat PET/CT in 2 months to determine treatment response.  He is pending RT consult.    Exam remarkable for tachycardia 115-140s, lungs CTAB, no respiratory distress, wheezing, crackles, fever, tachycardia, tachypnea, hypoxemia. Ambulated with hypoxemia. He is immunocompromised and worsening PNA is a possibility but he lacks constitutional symptoms, fever, exertional SOB.  Unlikely for this to be related to PE, compliant with xarelto and he has no hypoxemia, tachypnea, pleuritic CP.  Will obtain labs, EKG, CXR, COVID.   Final Clinical Impressions(s) / ED Diagnoses   1640: Tachycardia has improved, pt states he is anxious and his HR always elevated in the hospital.  Labs unremarkable. COVID negative. CXR shows resolution of RLL opacity previously seen.  No cardiomegaly, pulmonary edema.  Discussed results with patient.  Given benign exam, improving tachycardia, labs, CXR doubt PNA, ACS, HF exacerbation or worsening PE burden or new PE.  Symptoms may be from post viral/infectious cough vs allergic cough.  Symptoms could also be from cancer/thoracic lesions seen on PET/CT scan last month. Will dc with antitussive, albuterol inhaler, antihistamine and oncology f/u.  Patient comfortable with this. Will message oncology team to facilitate f/u.   Final diagnoses:  Post-viral cough syndrome    ED Discharge Orders         Ordered    albuterol (VENTOLIN HFA) 108 (90 Base) MCG/ACT inhaler  Every 6 hours PRN     12/22/18 1517           Kinnie Feil, PA-C 12/22/18 [place], Ankit, MD 12/23/18 [phone]

## 2018-12-22 NOTE — Discharge Instructions (Signed)
You were seen in the ED for persistent cough.  Lab work, chest x-ray looked normal today.  The opacity in the right lower lung from 3 weeks ago has completely resolved.  Cover test was negative.  I suspect your cough is a postviral cough.  Your lungs and airways may be having bronchospasms.  Also your allergies may be contributing.  Continue using albuterol inhaler every 4 hours.  Start taking a cough suppressant medicine such as Delsym over-the-counter.  Postnasal drip, nasal congestion can also contribute to cough reflex, continue taking your allergy medicine and try over-the-counter steroid nasal spray such as Flonase.  I will send your oncologist a message to facilitate follow-up, from their last notes it sounded like they wanted to repeat a PET/CT scan in 2 months.  Call them on Monday to determine when this will be done.  Return to the ED for worsening symptoms, fever greater than 100, chills, sore throat, chest pain or shortness of breath with exertion.

## 2018-12-22 NOTE — ED Notes (Signed)
Patient verbalizes understanding of discharge instructions. Opportunity for questioning and answers were provided. Armband removed by staff, pt discharged from ED.  

## 2018-12-23 ENCOUNTER — Telehealth: Payer: Self-pay | Admitting: *Deleted

## 2018-12-23 NOTE — Telephone Encounter (Signed)
Patient states he was at ER for cough yesterday. States he is using Albuterol inhaler every 6 hours as directed, but still coughing. They told him to call his doctor to find out what he could take. Dr.Kale recommended Robitussin and Mucinex if cough is productive. Information given to patient. Patient verbalized understanding.

## 2018-12-26 ENCOUNTER — Telehealth: Payer: Self-pay | Admitting: *Deleted

## 2018-12-26 NOTE — Telephone Encounter (Signed)
MR faxed to [place] - attn: Dr. Zella Richer - Release [id number]

## 2018-12-27 ENCOUNTER — Telehealth: Payer: Self-pay

## 2018-12-27 NOTE — Telephone Encounter (Signed)
Patient called requesting a hard copy of all images. Called radiology and the CD burner is not working however is was stated that they would try to send images through PACS. Also received a call from [facility] at Dr. Rae Roam office. She stated she will look for images in PACS. Also gave Ruby the number to radiology for any further questions about receiving images via PACS.

## 2019-01-28 ENCOUNTER — Telehealth: Payer: Self-pay | Admitting: *Deleted

## 2019-01-28 NOTE — Telephone Encounter (Signed)
Patient called. He is transferring his care to [facility] and needs to cancel the rest of his appts on  7/16. He wanted to thank Dr. Irene Limbo and the nurses for his care while at the [facility].

## 2019-01-29 ENCOUNTER — Encounter (HOSPITAL_COMMUNITY): Payer: BC Managed Care – PPO

## 2019-02-06 ENCOUNTER — Other Ambulatory Visit: Payer: BLUE CROSS/BLUE SHIELD

## 2019-02-06 ENCOUNTER — Ambulatory Visit: Payer: BLUE CROSS/BLUE SHIELD | Admitting: Hematology

## 2019-02-18 ENCOUNTER — Encounter: Payer: Self-pay | Admitting: Hematology

## 2019-02-18 NOTE — Progress Notes (Signed)
Patient's sister called seeking information regarding resources that may be able to assist with past medical bills.  Gave her number to [employer] and information regarding applying for Medicaid online through [place].  She has my contact information for any additional concerns.

## 2019-06-08 MED ORDER — CIPROFLOXACIN HCL 500 MG PO TABS
500.00 | ORAL_TABLET | ORAL | Status: DC
Start: 2019-06-07 — End: 2019-06-08

## 2019-06-08 MED ORDER — GENERIC EXTERNAL MEDICATION
Status: DC
Start: ? — End: 2019-06-08

## 2019-06-08 MED ORDER — ACYCLOVIR 200 MG PO CAPS
400.00 | ORAL_CAPSULE | ORAL | Status: DC
Start: 2019-06-07 — End: 2019-06-08

## 2019-06-08 MED ORDER — TRAMADOL HCL 50 MG PO TABS
50.00 | ORAL_TABLET | ORAL | Status: DC
Start: ? — End: 2019-06-08

## 2019-06-08 MED ORDER — URSODIOL 300 MG PO CAPS
300.00 | ORAL_CAPSULE | ORAL | Status: DC
Start: 2019-06-08 — End: 2019-06-08

## 2019-06-08 MED ORDER — PROCHLORPERAZINE MALEATE 10 MG PO TABS
10.00 | ORAL_TABLET | ORAL | Status: DC
Start: ? — End: 2019-06-08

## 2019-06-08 MED ORDER — GENERIC EXTERNAL MEDICATION
100.00 | Status: DC
Start: 2019-06-08 — End: 2019-06-08

## 2019-06-18 MED ORDER — SODIUM CHLORIDE 0.9 % IV SOLN
INTRAVENOUS | Status: DC
Start: ? — End: 2019-06-18

## 2019-06-18 MED ORDER — LORAZEPAM 2 MG/ML IJ SOLN
0.50 | INTRAMUSCULAR | Status: DC
Start: ? — End: 2019-06-18

## 2019-06-18 MED ORDER — GENERIC EXTERNAL MEDICATION
10.00 | Status: DC
Start: ? — End: 2019-06-18

## 2019-06-18 MED ORDER — FEXOFENADINE HCL 180 MG PO TABS
180.00 | ORAL_TABLET | ORAL | Status: DC
Start: 2019-06-19 — End: 2019-06-18

## 2019-06-18 MED ORDER — SUMATRIPTAN SUCCINATE 50 MG PO TABS
50.00 | ORAL_TABLET | ORAL | Status: DC
Start: ? — End: 2019-06-18

## 2019-06-18 MED ORDER — POSACONAZOLE 100 MG PO TBEC
400.00 | DELAYED_RELEASE_TABLET | ORAL | Status: DC
Start: ? — End: 2019-06-18

## 2019-06-18 MED ORDER — OXYCODONE HCL 5 MG PO TABS
10.00 | ORAL_TABLET | ORAL | Status: DC
Start: ? — End: 2019-06-18

## 2019-06-18 MED ORDER — GENERIC EXTERNAL MEDICATION
Status: DC
Start: ? — End: 2019-06-18

## 2019-06-18 MED ORDER — ONDANSETRON HCL 8 MG PO TABS
8.00 | ORAL_TABLET | ORAL | Status: DC
Start: ? — End: 2019-06-18

## 2019-06-18 MED ORDER — URSODIOL 300 MG PO CAPS
300.00 | ORAL_CAPSULE | ORAL | Status: DC
Start: 2019-06-19 — End: 2019-06-18

## 2019-06-18 MED ORDER — CHOLECALCIFEROL 25 MCG (1000 UT) PO TABS
2000.00 | ORAL_TABLET | ORAL | Status: DC
Start: 2019-06-19 — End: 2019-06-18

## 2019-06-18 MED ORDER — PROCHLORPERAZINE MALEATE 10 MG PO TABS
10.00 | ORAL_TABLET | ORAL | Status: DC
Start: ? — End: 2019-06-18

## 2019-06-18 MED ORDER — LETERMOVIR 480 MG PO TABS
480.00 | ORAL_TABLET | ORAL | Status: DC
Start: 2019-06-18 — End: 2019-06-18

## 2019-06-18 MED ORDER — GENERIC EXTERNAL MEDICATION
2.00 | Status: DC
Start: 2019-06-18 — End: 2019-06-18

## 2019-06-18 MED ORDER — LORAZEPAM 1 MG PO TABS
1.00 | ORAL_TABLET | ORAL | Status: DC
Start: ? — End: 2019-06-18

## 2019-06-18 MED ORDER — ALBUTEROL SULFATE HFA 108 (90 BASE) MCG/ACT IN AERS
2.00 | INHALATION_SPRAY | RESPIRATORY_TRACT | Status: DC
Start: ? — End: 2019-06-18

## 2019-06-18 MED ORDER — GENERIC EXTERNAL MEDICATION
0.07 | Status: DC
Start: ? — End: 2019-06-18

## 2019-06-18 MED ORDER — ACYCLOVIR 200 MG PO CAPS
400.00 | ORAL_CAPSULE | ORAL | Status: DC
Start: 2019-06-18 — End: 2019-06-18

## 2019-06-18 MED ORDER — GENERIC EXTERNAL MEDICATION
1.75 | Status: DC
Start: 2019-06-18 — End: 2019-06-18

## 2019-06-18 MED ORDER — LIDOCAINE HCL 1 % IJ SOLN
0.50 | INTRAMUSCULAR | Status: DC
Start: ? — End: 2019-06-18

## 2019-06-18 MED ORDER — MYCOPHENOLATE MOFETIL 500 MG PO TABS
1000.00 | ORAL_TABLET | ORAL | Status: DC
Start: 2019-06-19 — End: 2019-06-18

## 2019-06-18 MED ORDER — ONDANSETRON HCL 4 MG/2ML IJ SOLN
4.00 | INTRAMUSCULAR | Status: DC
Start: ? — End: 2019-06-18

## 2019-06-18 MED ORDER — ACETAMINOPHEN 325 MG PO TABS
975.00 | ORAL_TABLET | ORAL | Status: DC
Start: ? — End: 2019-06-18

## 2021-12-28 ENCOUNTER — Other Ambulatory Visit: Payer: Self-pay | Admitting: Oncology
